# Patient Record
Sex: Female | Born: 1985 | Race: Black or African American | Hispanic: No | Marital: Married | Smoking: Former smoker
Health system: Southern US, Community
[De-identification: ages and names within clinical notes are randomized; demographics above are authoritative.]

## PROBLEM LIST (undated history)

## (undated) ENCOUNTER — Emergency Department (HOSPITAL_COMMUNITY): Admission: EM

## (undated) DIAGNOSIS — E559 Vitamin D deficiency, unspecified: Secondary | ICD-10-CM

## (undated) DIAGNOSIS — R079 Chest pain, unspecified: Secondary | ICD-10-CM

## (undated) DIAGNOSIS — K5900 Constipation, unspecified: Secondary | ICD-10-CM

## (undated) DIAGNOSIS — M549 Dorsalgia, unspecified: Secondary | ICD-10-CM

## (undated) DIAGNOSIS — F17200 Nicotine dependence, unspecified, uncomplicated: Secondary | ICD-10-CM

## (undated) DIAGNOSIS — D649 Anemia, unspecified: Secondary | ICD-10-CM

## (undated) DIAGNOSIS — Z6841 Body Mass Index (BMI) 40.0 and over, adult: Secondary | ICD-10-CM

## (undated) DIAGNOSIS — G4730 Sleep apnea, unspecified: Secondary | ICD-10-CM

## (undated) DIAGNOSIS — K219 Gastro-esophageal reflux disease without esophagitis: Secondary | ICD-10-CM

## (undated) DIAGNOSIS — N979 Female infertility, unspecified: Secondary | ICD-10-CM

## (undated) DIAGNOSIS — M2550 Pain in unspecified joint: Secondary | ICD-10-CM

## (undated) DIAGNOSIS — I499 Cardiac arrhythmia, unspecified: Secondary | ICD-10-CM

## (undated) DIAGNOSIS — R002 Palpitations: Secondary | ICD-10-CM

## (undated) DIAGNOSIS — E282 Polycystic ovarian syndrome: Secondary | ICD-10-CM

## (undated) DIAGNOSIS — D573 Sickle-cell trait: Secondary | ICD-10-CM

## (undated) DIAGNOSIS — I480 Paroxysmal atrial fibrillation: Secondary | ICD-10-CM

## (undated) HISTORY — PX: OTHER SURGICAL HISTORY: SHX169

## (undated) HISTORY — DX: Sleep apnea, unspecified: G47.30

## (undated) HISTORY — DX: Dorsalgia, unspecified: M54.9

## (undated) HISTORY — DX: Body Mass Index (BMI) 40.0 and over, adult: Z684

## (undated) HISTORY — DX: Constipation, unspecified: K59.00

## (undated) HISTORY — DX: Chest pain, unspecified: R07.9

## (undated) HISTORY — DX: Polycystic ovarian syndrome: E28.2

## (undated) HISTORY — DX: Female infertility, unspecified: N97.9

## (undated) HISTORY — DX: Pain in unspecified joint: M25.50

## (undated) HISTORY — PX: WISDOM TOOTH EXTRACTION: SHX21

## (undated) HISTORY — DX: Paroxysmal atrial fibrillation: I48.0

## (undated) HISTORY — DX: Palpitations: R00.2

## (undated) HISTORY — DX: Anemia, unspecified: D64.9

## (undated) HISTORY — DX: Morbid (severe) obesity due to excess calories: E66.01

## (undated) HISTORY — DX: Vitamin D deficiency, unspecified: E55.9

## (undated) HISTORY — DX: Nicotine dependence, unspecified, uncomplicated: F17.200

---

## 2003-03-22 ENCOUNTER — Other Ambulatory Visit: Admission: RE | Admit: 2003-03-22 | Discharge: 2003-03-22 | Payer: Self-pay

## 2004-06-25 ENCOUNTER — Emergency Department (HOSPITAL_COMMUNITY): Admission: EM | Admit: 2004-06-25 | Discharge: 2004-06-25 | Payer: Self-pay | Admitting: Emergency Medicine

## 2004-07-09 ENCOUNTER — Emergency Department (HOSPITAL_COMMUNITY): Admission: EM | Admit: 2004-07-09 | Discharge: 2004-07-09 | Payer: Self-pay | Admitting: Emergency Medicine

## 2004-09-09 ENCOUNTER — Emergency Department (HOSPITAL_COMMUNITY): Admission: EM | Admit: 2004-09-09 | Discharge: 2004-09-09 | Payer: Self-pay | Admitting: Emergency Medicine

## 2006-07-25 ENCOUNTER — Ambulatory Visit: Payer: Self-pay | Admitting: Cardiology

## 2007-10-14 ENCOUNTER — Ambulatory Visit: Payer: Self-pay | Admitting: Cardiology

## 2009-03-23 DIAGNOSIS — R002 Palpitations: Secondary | ICD-10-CM | POA: Insufficient documentation

## 2010-11-27 NOTE — Assessment & Plan Note (Signed)
[facility]                          [facility] NOTE   NAME:Deborah Riddle, Deborah Riddle                      MRN:          [id number]  DATE:10/14/2007                            DOB:          1985-08-16    REFERRING PHYSICIAN:  Van Clines, M.D.   REASON FOR CONSULTATION:  Palpitations.   HISTORY OF PRESENT ILLNESS:  Deborah Riddle is a 25 year old woman with a  reported history of hyperlipidemia being managed by diet and also a  history of bipolar depression followed by [facility] in [place]  on the medications outlined below.  In reviewing available records, I  see that she has a previously diagnosed history of paroxysmal atrial  fibrillation back in January of 2008.  At that time, she was observed in  the hospital and spontaneously converted to sinus rhythm.  She had an  echocardiogram done in January of 2008 which revealed mild left  ventricular hypertrophy with an ejection fraction of 60-65% and no major  valvular abnormalities with a trivial pericardial effusion.  She reports  essentially fairly brief, usually just a few minutes, episodes of  palpitations which occur perhaps once a month.  She had a more prolonged  episode lasting 3-4 hours recently and went to the emergency department.  She was seen by Dr. Gwendolyn Fill and referred originally for a CardioNet  monitor and then follow up with [place].  Deborah Riddle has no health insurance  and did not obtain the cardiac monitor.  She presents today for further  assessment.   She has had no recent prolonged episodes of palpitations.  Her  electrocardiogram today shows sinus rhythm with nonspecific ST-T wave  changes.  I have a copy of an electrocardiogram from September 20, 2007 which  is consistent with atrial fibrillation at a rate of 87 beats per minute  to 108 beats per minute.  She has no clear history of diabetes mellitus,  hypertension, thyroid disease, valvular heart disease or cerebrovascular  disease.   ALLERGIES:  Possibly to HEPARIN (details not clear).   PAST MEDICAL HISTORY:  As outlined above.  I also see previously  documented thrombocytopenia, etiology not certain.  It looks as if her  blood count showed some platelet clumping, and this may have been a lab  error.  She has not had recent blood work.  She did have a TSH within  normal range of 1.9 back in January of last year.   PAST SURGICAL HISTORY:  She denies any surgeries.   SOCIAL HISTORY:  Patient has a tobacco use history.  Denies any alcohol  use or other illicit substances.   FAMILY HISTORY:  Family history was reviewed.  The patient reports a  sister with some type of heart rhythm problem. Nobody with a history  of sudden cardiac death.   REVIEW OF SYSTEMS:  As outlined above.  Otherwise negative.   PHYSICAL EXAMINATION:  VITAL SIGNS:  Blood pressure today is 118/83,  heart rate is 82 and regular.  Weight is 271 pounds.  GENERAL:  This is an obese woman in no acute distress.  HEENT:  Conjunctivae were normal.  Oropharynx was clear.  NECK:  Supple.  No elevated jugular venous pressure.  No loud bruits.  No thyromegaly is noted.  LUNGS:  Clear without labored breathing at rest.  CARDIAC:  A regular rate and rhythm.  No loud murmur or gallop.  ABDOMEN:  Soft, nontender.  Normoactive bowel sounds.  EXTREMITIES:  No significant pitting edema.  Distal pulses are 2+.  SKIN:  Warm and dry.  MUSCULOSKELETAL:  No kyphosis is noted.  NEUROPSYCHIATRIC:  Patient is alert and oriented x3.  Affect is  appropriate.   IMPRESSION AND RECOMMENDATIONS:  History of intermittent palpitations  and previously documented paroxysmal atrial fibrillation.  I suspect her  recent prolonged episode of palpitations was in fact atrial  fibrillation.  Deborah Riddle has no medical insurance and prefers to hold off  on as much testing as possible at this time.  I think it would at least  be worthwhile to repeat a CBC and a TSH to make  sure that she is neither  anemic, thrombocytopenic, or hyperthyroid given her history.  She had a  previous echocardiogram from last year showing no major valvular  abnormalities, and otherwise I will plan to give her a prescription for  p.r.n., propranolol 20 mg for prolonged episodes.  I do not think that  Coumadin is indicated at this time.  We can discuss aspirin, assuming  her history of previously noted thrombocytopenia is not an issue.  Otherwise, we talked about avoiding tobacco use and also significant  caffeine use.  I will plan to see her back over the next 6 months.     Jonelle Sidle, MD  Electronically Signed    SGM/MedQ  DD: 10/14/2007  DT: 10/14/2007  Job #: [id number]   cc:   Van Clines, M.D.

## 2011-08-16 DIAGNOSIS — R0602 Shortness of breath: Secondary | ICD-10-CM

## 2011-08-16 DIAGNOSIS — I4891 Unspecified atrial fibrillation: Secondary | ICD-10-CM

## 2011-08-30 ENCOUNTER — Encounter: Payer: Self-pay | Admitting: Cardiovascular Disease

## 2011-09-10 ENCOUNTER — Other Ambulatory Visit: Payer: Self-pay | Admitting: *Deleted

## 2011-09-10 ENCOUNTER — Encounter: Payer: Self-pay | Admitting: Cardiovascular Disease

## 2011-09-10 ENCOUNTER — Ambulatory Visit (INDEPENDENT_AMBULATORY_CARE_PROVIDER_SITE_OTHER): Payer: PRIVATE HEALTH INSURANCE | Admitting: Cardiovascular Disease

## 2011-09-10 DIAGNOSIS — I4891 Unspecified atrial fibrillation: Secondary | ICD-10-CM

## 2011-09-10 DIAGNOSIS — I480 Paroxysmal atrial fibrillation: Secondary | ICD-10-CM

## 2011-09-10 NOTE — Assessment & Plan Note (Signed)
The patient has paroxysmal atrial fibrillation with recent episode that lasted for about 6 hours. Her echocardiogram was unremarkable. Her thyroid function was normal. She does not have symptoms of sleep apnea. She had 4 episodes of palpitations and tachycardia since her hospital discharge. None lasted for more than one hour but she did take metoprolol. She prefers not to take medications on a regular basis. She is also not in favor of ablation. I will obtain a 14 day outpatient telemetry to evaluate her atrial fibrillation burden. The best management option might be the "pill in the pocket" approach with flecainide or propafenone. This will be decided after her monitor.

## 2011-09-10 NOTE — Progress Notes (Signed)
HPI  This is a 26 year old female who is here today for a followup visit. She was recently briefly hospitalized for atrial fibrillation with rapid ventricular response. She was initially diagnosed with atrial fibrillation in 2008 with an unremarkable echocardiogram. She did not have any prolonged episodes since then up until the current episode that lasted for about 6 hours. She converted to normal sinus rhythm. She had another echocardiogram done which showed normal LV systolic function, mild mitral regurgitation and no evidence of pulmonary hypertension. She is obese but does not have symptoms suggestive of sleep apnea. She does not have hyperthyroidism. She thinks that her mom has atrial fibrillation. Since her discharge, she had few episodes of atrial fibrillation. She used metoprolol 25 mg and went back into sinus rhythm.  Allergies not on file   No current outpatient prescriptions on file prior to visit.     Past Medical History  Diagnosis Date  . Chest pain, unspecified   . Morbid obesity   . Tobacco use disorder   . Body mass index 40.0-44.9, adult   . Encounter for long-term (current) use of other medications   . Paroxysmal atrial fibrillation   . Polycystic disease, ovaries      History reviewed. No pertinent past surgical history.   Family History  Problem Relation Age of Onset  . Arrhythmia Mother 43    History of Cardiac Stents  . Diabetes      Family History  . Hypertension      Family History     History   Social History  . Marital Status: Married    Spouse Name: CHRISTOPHER     Number of Children: N/A  . Years of Education: N/A   Occupational History  . [employer]   Social History Main Topics  . Smoking status: Former Smoker -- 1.0 packs/day    Types: Cigarettes    Quit date: 09/10/2011  . Smokeless tobacco: Not on file  . Alcohol Use: Yes     Drinks alcohol on social occasions   . Drug Use: No  . Sexually Active: Not on file     Other Topics Concern  . Not on file   Social History Narrative  . No narrative on file     PHYSICAL EXAM   BP 130/76  Pulse 74  Resp 18  Ht 5\' 8"  (1.727 m)  Wt 270 lb 6.4 oz (122.653 kg)  BMI 41.11 kg/m2  SpO2 99%  Constitutional: She is oriented to person, place, and time. She appears well-developed and well-nourished. No distress.  HENT: No nasal discharge.  Head: Normocephalic and atraumatic.  Eyes: Pupils are equal and round. Right eye exhibits no discharge. Left eye exhibits no discharge.  Neck: Normal range of motion. Neck supple. No JVD present. No thyromegaly present.  Cardiovascular: Normal rate, regular rhythm, normal heart sounds. Exam reveals no gallop and no friction rub. No murmur heard.  Pulmonary/Chest: Effort normal and breath sounds normal. No stridor. No respiratory distress. She has no wheezes. She has no rales. She exhibits no tenderness.  Abdominal: Soft. Bowel sounds are normal. She exhibits no distension. There is no tenderness. There is no rebound and no guarding.  Musculoskeletal: Normal range of motion. She exhibits no edema and no tenderness.  Neurological: She is alert and oriented to person, place, and time. Coordination normal.  Skin: Skin is warm and dry. No rash noted. She is not diaphoretic. No erythema. No pallor.  Psychiatric: She has a normal mood and  affect. Her behavior is normal. Judgment and thought content normal.    EKG: Normal sinus rhythm with nonspecific T waves in the anterior leads. Normal PR and QT intervals   ASSESSMENT AND PLAN

## 2011-09-10 NOTE — Patient Instructions (Addendum)
Your physician recommends that you schedule a follow-up appointment after we get the results of your monitor. Please call and schedule this appointment the week you receive your monitor.  Your physician has recommended that you wear an event monitor. Event monitors are medical devices that record the heart's electrical activity. Doctors most often [place] these monitors to diagnose arrhythmias. Arrhythmias are problems with the speed or rhythm of the heartbeat. The monitor is a small, portable device. You can wear one while you do your normal daily activities. This is usually used to diagnose what is causing palpitations/syncope (passing out).    Your physician recommends that you continue on your current medications as directed. Please refer to the Current Medication list given to you today.

## 2011-09-17 DIAGNOSIS — I4891 Unspecified atrial fibrillation: Secondary | ICD-10-CM

## 2011-09-21 ENCOUNTER — Inpatient Hospital Stay (HOSPITAL_COMMUNITY)
Admission: AD | Admit: 2011-09-21 | Discharge: 2011-09-22 | DRG: 309 | Disposition: A | Discharge status: Home / Self Care | Payer: PRIVATE HEALTH INSURANCE | Source: Other Acute Inpatient Hospital | Attending: Cardiovascular Disease | Admitting: Cardiovascular Disease

## 2011-09-21 ENCOUNTER — Encounter (HOSPITAL_COMMUNITY): Payer: Self-pay | Admitting: *Deleted

## 2011-09-21 DIAGNOSIS — E119 Type 2 diabetes mellitus without complications: Secondary | ICD-10-CM | POA: Diagnosis present

## 2011-09-21 DIAGNOSIS — I4891 Unspecified atrial fibrillation: Principal | ICD-10-CM | POA: Diagnosis present

## 2011-09-21 DIAGNOSIS — Z6841 Body Mass Index (BMI) 40.0 and over, adult: Secondary | ICD-10-CM

## 2011-09-21 DIAGNOSIS — F17200 Nicotine dependence, unspecified, uncomplicated: Secondary | ICD-10-CM | POA: Diagnosis present

## 2011-09-21 DIAGNOSIS — E282 Polycystic ovarian syndrome: Secondary | ICD-10-CM | POA: Diagnosis present

## 2011-09-22 ENCOUNTER — Other Ambulatory Visit: Payer: Self-pay

## 2011-09-22 DIAGNOSIS — I4891 Unspecified atrial fibrillation: Secondary | ICD-10-CM

## 2011-09-22 LAB — DIFFERENTIAL
Basophils Absolute: 0 10*3/uL (ref 0.0–0.1)
Basophils Relative: 0 % (ref 0–1)
Eosinophils Relative: 2 % (ref 0–5)
Lymphocytes Relative: 43 % (ref 12–46)
Monocytes Absolute: 0.5 10*3/uL (ref 0.1–1.0)
Neutro Abs: 4.9 10*3/uL (ref 1.7–7.7)

## 2011-09-22 LAB — CBC
HCT: 37.2 % (ref 36.0–46.0)
MCHC: 34.7 g/dL (ref 30.0–36.0)
Platelets: 232 10*3/uL (ref 150–400)
RDW: 15.3 % (ref 11.5–15.5)
WBC: 9.9 10*3/uL (ref 4.0–10.5)

## 2011-09-22 LAB — COMPREHENSIVE METABOLIC PANEL
AST: 14 U/L (ref 0–37)
Albumin: 3.1 g/dL — ABNORMAL LOW (ref 3.5–5.2)
BUN: 12 mg/dL (ref 6–23)
Creatinine, Ser: 0.83 mg/dL (ref 0.50–1.10)
Potassium: 3.7 mEq/L (ref 3.5–5.1)
Total Protein: 6.6 g/dL (ref 6.0–8.3)

## 2011-09-22 LAB — APTT: aPTT: 30 seconds (ref 24–37)

## 2011-09-22 LAB — MAGNESIUM: Magnesium: 2 mg/dL (ref 1.5–2.5)

## 2011-09-22 LAB — PROTIME-INR: Prothrombin Time: 12.6 seconds (ref 11.6–15.2)

## 2011-09-22 LAB — GLUCOSE, CAPILLARY: Glucose-Capillary: 102 mg/dL — ABNORMAL HIGH (ref 70–99)

## 2011-09-22 LAB — TSH: TSH: 3.49 u[IU]/mL (ref 0.350–4.500)

## 2011-09-22 MED ORDER — SODIUM CHLORIDE 0.9 % IJ SOLN
3.0000 mL | Freq: Two times a day (BID) | INTRAMUSCULAR | Status: DC
  Administered 2011-09-22: 3 mL via INTRAVENOUS

## 2011-09-22 MED ORDER — FLECAINIDE ACETATE 100 MG PO TABS
300.0000 mg | ORAL_TABLET | ORAL | Status: AC
  Administered 2011-09-22: 300 mg via ORAL
  Filled 2011-09-22: qty 3

## 2011-09-22 MED ORDER — ONDANSETRON HCL 4 MG/2ML IJ SOLN: 4.0000 mg | Freq: Four times a day (QID) | INTRAMUSCULAR | Status: DC | PRN

## 2011-09-22 MED ORDER — ASPIRIN 300 MG RE SUPP
300.0000 mg | RECTAL | Status: AC
  Filled 2011-09-22: qty 1

## 2011-09-22 MED ORDER — FLECAINIDE ACETATE 150 MG PO TABS: ORAL_TABLET | ORAL | Status: DC

## 2011-09-22 MED ORDER — OFF THE BEAT BOOK
Freq: Once | Status: AC
  Administered 2011-09-22: 05:00:00
  Filled 2011-09-22: qty 1

## 2011-09-22 MED ORDER — ASPIRIN EC 325 MG PO TBEC: 325.0000 mg | DELAYED_RELEASE_TABLET | Freq: Every day | ORAL | Status: DC

## 2011-09-22 MED ORDER — SODIUM CHLORIDE 0.9 % IJ SOLN: 3.0000 mL | INTRAMUSCULAR | Status: DC | PRN

## 2011-09-22 MED ORDER — SODIUM CHLORIDE 0.9 % IV SOLN: 250.0000 mL | INTRAVENOUS | Status: DC | PRN

## 2011-09-22 MED ORDER — METFORMIN HCL 500 MG PO TABS
500.0000 mg | ORAL_TABLET | Freq: Two times a day (BID) | ORAL | Status: DC
  Administered 2011-09-22: 500 mg via ORAL
  Filled 2011-09-22 (×3): qty 1

## 2011-09-22 MED ORDER — ASPIRIN 81 MG PO CHEW
324.0000 mg | CHEWABLE_TABLET | ORAL | Status: AC
  Administered 2011-09-22: 324 mg via ORAL
  Filled 2011-09-22: qty 4

## 2011-09-22 MED ORDER — ACETAMINOPHEN 325 MG PO TABS: 650.0000 mg | ORAL_TABLET | ORAL | Status: DC | PRN

## 2011-09-22 MED ORDER — NITROGLYCERIN 0.4 MG SL SUBL: 0.4000 mg | SUBLINGUAL_TABLET | SUBLINGUAL | Status: DC | PRN

## 2011-09-22 MED ORDER — ASPIRIN 325 MG PO TBEC: 325.0000 mg | DELAYED_RELEASE_TABLET | Freq: Every day | ORAL | Status: AC

## 2011-09-22 NOTE — Discharge Summary (Signed)
Physician Discharge Summary  Patient ID: Deborah Riddle MRN: [id number] DOB/AGE: 11-Mar-1986 26 y.o.  Admit date: 09/21/2011 Discharge date: 09/22/2011  Primary Discharge Diagnosis: PAF Secondary Discharge Diagnosis 1. Diabetes 2. Morbid Obesity  Significant Diagnostic Studies:None  Consults: None  Hospital Course: Deborah Riddle is a 26 year old morbidly obese female patient of Dr. Virgil Benedict in the [place] office. The patient has a history of paroxysmal atrial fibrillation, which was first diagnosed in 2008. She was seen by Dr. are read in the office on 09/10/2011 secondary to paroxysmal atrial fibrillation. He discussed the need of to be placed on a cardiac monitor and also consideration for flecainide. "Pill in the pocket.". The patient had a monitor placed on 09/12/2011 however, results are not available to discuss.  The patient woke up suddenly on 09/21/2011 with palpitations and racing heart. She felt her heart racing throughout the day. She was was seen at [facility] was found to be in A. fib with RVR and transferred to the hospital. She was seen by the cardiology fellow and given 300 mg of flecainide by mouth placed on telemetry for observation. She converted to normal sinus rhythm around 6 AM on 09/22/2011. She was without complaint of recurrent palpitations, chest pressure, dizziness, or lightheadedness. She was seen and examined by myself and Dr. Maretta Bees on day of discharge. She will continue metoprolol 25 mg daily and will be given a prescription for flecainide 300 mg to take when necessary for rapid heart rhythm. She was counseled on low caffeine diet and to avoid stimulants. She will also be seen in the [facility] office by Dr. Hillis Range, electrophysiologist for initial evaluation. He will discuss further need for ablation or continued medical management. Once his evaluation is completed. She will continue with cardiologist in [place].  Discharge Exam: Blood pressure 114/80,  pulse 81, temperature 97.8 F (36.6 C), temperature source Oral, resp. rate 18, height 5\' 8"  (1.727 m), weight 283 lb 4.7 oz (128.5 kg), last menstrual period 09/11/2011, SpO2 98.00%. General: Well developed, well nourished, in no acute distress obese Head: Eyes PERRLA, No xanthomas.   Normal cephalic and atramatic  Lungs: Clear bilaterally to auscultation and percussion. Heart: HRRR S1 S2, without MRG.  Pulses are 2+ & equal.            No carotid bruit. No JVD.  No abdominal bruits. No femoral bruits. Abdomen: Bowel sounds are positive, abdomen soft and non-tender without masses or                  Hernia's noted. Msk:  Back normal, normal gait. Normal strength and tone for age. Extremities: No clubbing, cyanosis or edema.  DP +1 Neuro: Alert and oriented X 3. Psych:  Good affect, responds appropriately   Labs:   Lab Results  Component Value Date   WBC 9.9 09/22/2011   HGB 12.9 09/22/2011   HCT 37.2 09/22/2011   MCV 73.1* 09/22/2011   PLT 232 09/22/2011     EKG:NSR rate in the 80's.  FOLLOW UP PLANS AND APPOINTMENTS  Will need to see Dr. Johney Frame, Electrocardiologist in [place] office. Office will call for appointment.  Discharge Orders    Future Orders Please Complete By Expires   Diet - low sodium heart healthy      Increase activity slowly        Medication List  As of 09/22/2011 11:00 AM   TAKE these medications         aspirin 325 MG EC  tablet   Take 1 tablet (325 mg total) by mouth daily.      flecainide 150 MG tablet   Commonly known as: TAMBOCOR   2 tablets or 300 mg as needed for rapid heart rate.      metFORMIN 500 MG tablet   Commonly known as: GLUCOPHAGE   Take 500 mg by mouth 2 (two) times daily with a meal.      metoprolol tartrate 25 MG tablet   Commonly known as: LOPRESSOR   Take 25 mg by mouth daily as needed. For racing heart           Time spent with patient to include physician time:35 min Signed: Joni Reining 09/22/2011, 11:00 AM Co-Sign  MD

## 2011-09-22 NOTE — Progress Notes (Signed)
09/22/11 0504  Patient spontaneously converted back to normal sinus rhythm.  Strip posted on chart.  Alonza Bogus

## 2011-09-22 NOTE — Discharge Instructions (Signed)
Atrial Fibrillation Your caregiver has diagnosed you with atrial fibrillation (AFib). The heart normally beats very regularly; AFib is a type of irregular heartbeat. The heart rate may be faster or slower than normal. This can prevent your heart from pumping as well as it should. AFib can be constant (chronic) or intermittent (paroxysmal). CAUSES  Atrial fibrillation may be caused by:  Heart disease, including heart attack, coronary artery disease, heart failure, diseases of the heart valves, and others.   Blood clot in the lungs (pulmonary embolism).   Pneumonia or other infections.   Chronic lung disease.   Thyroid disease.   Toxins. These include alcohol, some medications (such as decongestant medications or diet pills), and caffeine.  In some people, no cause for AFib can be found. This is referred to as Lone Atrial Fibrillation. SYMPTOMS   Palpitations or a fluttering in your chest.   A vague sense of chest discomfort.   Shortness of breath.   Sudden onset of lightheadedness or weakness.  Sometimes, the first sign of AFib can be a complication of the condition. This could be a stroke or heart failure. DIAGNOSIS  Your description of your condition may make your caregiver suspicious of atrial fibrillation. Your caregiver will examine your pulse to determine if fibrillation is present. An EKG (electrocardiogram) will confirm the diagnosis. Further testing may help determine what caused you to have atrial fibrillation. This may include chest x-ray, echocardiogram, blood tests, or CT scans. PREVENTION  If you have previously had atrial fibrillation, your caregiver may advise you to avoid substances known to cause the condition (such as stimulant medications, and possibly caffeine or alcohol). You may be advised to use medications to prevent recurrence. Proper treatment of any underlying condition is important to help prevent recurrence. PROGNOSIS  Atrial fibrillation does tend to  become a chronic condition over time. It can cause significant complications (see below). Atrial fibrillation is not usually immediately life-threatening, but it can shorten your life expectancy. This seems to be worse in women. If you have lone atrial fibrillation and are under 60 years old, the risk of complications is very low, and life expectancy is not shortened. RISKS AND COMPLICATIONS  Complications of atrial fibrillation can include stroke, chest pain, and heart failure. Your caregiver will recommend treatments for the atrial fibrillation, as well as for any underlying conditions, to help minimize risk of complications. TREATMENT  Treatment for AFib is divided into several categories:  Treatment of any underlying condition.   Converting you out of AFib into a regular (sinus) rhythm.   Controlling rapid heart rate.   Prevention of blood clots and stroke.  Medications and procedures are available to convert your atrial fibrillation to sinus rhythm. However, recent studies have shown that this may not offer you any advantage, and cardiac experts are continuing research and debate on this topic. More important is controlling your rapid heartbeat. The rapid heartbeat causes more symptoms, and places strain on your heart. Your caregiver will advise you on the use of medications that can control your heart rate. Atrial fibrillation is a strong stroke risk. You can lessen this risk by taking blood thinning medications such as Coumadin (warfarin), or sometimes aspirin. These medications need close monitoring by your caregiver. Over-medication can cause bleeding. Too little medication may not protect against stroke. HOME CARE INSTRUCTIONS   If your caregiver prescribed medicine to make your heartbeat more normally, take as directed.   If blood thinners were prescribed by your caregiver, take EXACTLY as directed.     Perform blood tests EXACTLY as directed.   Quit smoking. Smoking increases your  cardiac and lung (pulmonary) risks.   DO NOT drink alcohol.   DO NOT drink caffeinated drinks (e.g. coffee, soda, chocolate, and leaf teas). You may drink decaffeinated coffee, soda or tea.   If you are overweight, you should choose a reduced calorie diet to lose weight. Please see a registered dietitian if you need more information about healthy weight loss. DO NOT USE DIET PILLS as they may aggravate heart problems.   If you have other heart problems that are causing AFib, you may need to eat a low salt, fat, and cholesterol diet. Your caregiver will tell you if this is necessary.   Exercise every day to improve your physical fitness. Stay active unless advised otherwise.   If your caregiver has given you a follow-up appointment, it is very important to keep that appointment. Not keeping the appointment could result in heart failure or stroke. If there is any problem keeping the appointment, you must call back to this facility for assistance.  SEEK MEDICAL CARE IF:  You notice a change in the rate, rhythm or strength of your heartbeat.   You develop an infection or any other change in your overall health status.  SEEK IMMEDIATE MEDICAL CARE IF:   You develop chest pain, abdominal pain, sweating, weakness or feel sick to your stomach (nausea).   You develop shortness of breath.   You develop swollen feet and ankles.   You develop dizziness, numbness, or weakness of your face or limbs, or any change in vision or speech.  MAKE SURE YOU:   Understand these instructions.   Will watch your condition.   Will get help right away if you are not doing well or get worse.  Document Released: 07/01/2005 Document Revised: 06/20/2011 Document Reviewed: 02/03/2008 Exit[employer] Patient Information 2012 [employer]. 

## 2011-09-22 NOTE — Progress Notes (Addendum)
SUBJECTIVE: Feels better. No complaints of shortness of breath or palpitations. Converted to NSR at 6 am this morning.  Principal Problem:  *Atrial fibrillation   LABS: Basic Metabolic Panel:  Basename 09/22/11 0143  NA 140  K 3.7  CL 106  CO2 26  GLUCOSE 115*  BUN 12  CREATININE 0.83  CALCIUM 8.9  MG 2.0  PHOS --   Liver Function Tests:  Basename 09/22/11 0143  AST 14  ALT 11  ALKPHOS 79  BILITOT 0.2*  PROT 6.6  ALBUMIN 3.1*   No results found for this basename: LIPASE:2,AMYLASE:2 in the last 72 hours CBC:  Basename 09/22/11 0143  WBC 9.9  NEUTROABS 4.9  HGB 12.9  HCT 37.2  MCV 73.1*  PLT 232     PHYSICAL EXAM BP 114/80  Pulse 81  Temp(Src) 97.8 F (36.6 C) (Oral)  Resp 18  Ht 5\' 8"  (1.727 m)  Wt 283 lb 4.7 oz (128.5 kg)  BMI 43.07 kg/m2  SpO2 98%  LMP 09/11/2011 General: Well developed, well nourished, in no acute distress Head: Eyes PERRLA, No xanthomas.   Normal cephalic and atramatic  Lungs: Clear bilaterally to auscultation and percussion. Heart: HRRR S1 S2, No MRG .  Pulses are 2+ & equal.            No carotid bruit. No JVD.  No abdominal bruits. No femoral bruits. Abdomen: Bowel sounds are positive, abdomen soft and non-tender without masses or                  Hernia's noted. Msk:  Back normal, normal gait. Normal strength and tone for age. Extremities: No clubbing, cyanosis or edema.  DP +1 Neuro: Alert and oriented X 3. Psych:  Good affect, responds appropriately  TELEMETRY: Reviewed telemetry pt in: NSR  ASSESSMENT AND PLAN:  1. PAF: She has converted to NSR after one dose of flecinide 300mg .  She is feeling better and without symptoms. She will need follow-up with EP, Dr. Johney Frame in [place] after discharge.  She will be given flecinide 300mg  tablet "pill in the pocket" on discharge. Review of Dr. Jari Sportsman note states that she has normal TSH. Normal Echo.  Bettey Mare. Lyman Bishop NP [facility] 09/22/2011, 10:45 AM  Cardiology  Attending  Patient seen and examined. I have reviewed Ms. Lawrence's assessment and plan. Will discharge this a.m. With a pill in the pocket of flecainide. She will need followup in the next few months with Dr. Johney Frame. I have instructed her on avoiding caffeine, ETOH, and to get plenty of rest. Lewayne Bunting, M.D.

## 2011-09-22 NOTE — H&P (Signed)
Deborah Riddle is an 26 y.o. female with a past medical history significant for atrial fibrillation Chief Complaint: palpitations HPI: Patient was first diagnosed with AF back in 2008. Since then she has had very brief episodes of palpitations lasting about 5 mins. Last month however, she had an episode that lasted about 6 hrs requiring admission but self converted to sinus rhythm. Following discharge she was seen in clinic by Dr. Lorine Bears who prescribed an event monitor in order to help decide if to start her on a "pill in pocket" regimen.  She went to bed at about 1 am yesterday 09/21/2011 and woke up at 6 am with palpitations. She remained in AF all day and because this is highly unusual for her, decided to go to [facility] ER from where she was transferred here. She was still in AF upon admission here. Her symptoms include palpitations, lightheadedness and fatigue. She denied syncope, orthopnea, leg edema or SOB.   Past Medical History  Diagnosis Date  . Chest pain, unspecified   . Morbid obesity   . Tobacco use disorder   . Body mass index 40.0-44.9, adult   . Encounter for long-term (current) use of other medications   . Paroxysmal atrial fibrillation   . Polycystic disease, ovaries     No past surgical history on file.  Family History  Problem Relation Age of Onset  . Arrhythmia Mother 61    History of Cardiac Stents  . Diabetes      Family History  . Hypertension      Family History   Social History:  reports that she quit smoking 12 days ago. Her smoking use included Cigarettes. She smoked 1 pack per day. She does not have any smokeless tobacco history on file. She reports that she drinks alcohol. She reports that she does not use illicit drugs.  Allergies:  Allergies  Allergen Reactions  . Heparin Other (See Comments)    Lowers blood pressure     Medications Prior to Admission  Medication Dose Route Frequency Provider Last Rate Last Dose  . 0.9 %  sodium  chloride infusion  250 mL Intravenous PRN Marinus Maw, MD      . acetaminophen (TYLENOL) tablet 650 mg  650 mg Oral Q4H PRN Marinus Maw, MD      . aspirin chewable tablet 324 mg  324 mg Oral NOW Marinus Maw, MD       Or  . aspirin suppository 300 mg  300 mg Rectal NOW Marinus Maw, MD      . aspirin EC tablet 325 mg  325 mg Oral Daily Marinus Maw, MD      . flecainide [facility]) tablet 300 mg  300 mg Oral STAT Marinus Maw, MD      . metFORMIN (GLUCOPHAGE) tablet 500 mg  500 mg Oral BID WC Marinus Maw, MD      . nitroGLYCERIN (NITROSTAT) SL tablet 0.4 mg  0.4 mg Sublingual Q5 Min x 3 PRN Marinus Maw, MD      . ondansetron [facility]) injection 4 mg  4 mg Intravenous Q6H PRN Marinus Maw, MD      . sodium chloride 0.9 % injection 3 mL  3 mL Intravenous Q12H Marinus Maw, MD      . sodium chloride 0.9 % injection 3 mL  3 mL Intravenous PRN Marinus Maw, MD       Medications Prior to Admission  Medication Sig  Dispense Refill  . metFORMIN (GLUCOPHAGE) 500 MG tablet Take 500 mg by mouth 2 (two) times daily with a meal.      . metoprolol tartrate (LOPRESSOR) 25 MG tablet Take 25 mg by mouth daily as needed. For racing heart        Results for orders placed during the hospital encounter of 09/21/11 (from the past 48 hour(s))  GLUCOSE, CAPILLARY     Status: Normal   Collection Time   09/21/11  9:47 PM      Component Value Range Comment   Glucose-Capillary 82  70 - 99 (mg/dL)    No results found.  Review of Systems  Constitutional: Negative.   HENT: Negative.   Eyes: Negative.   Respiratory: Negative.   Gastrointestinal: Negative.   Genitourinary: Negative.   Musculoskeletal: Negative.   Skin: Negative.   Neurological: Negative.   Endo/Heme/Allergies: Negative.   Psychiatric/Behavioral: Negative.     Blood pressure 100/68, pulse 72, temperature 98.3 F (36.8 C), resp. rate 18, height 5\' 8"  (1.727 m), weight 283 lb 4.7 oz (128.5 kg), last menstrual period  09/11/2011, SpO2 98.00%. Physical Exam  Constitutional: No distress.  HENT:  Mouth/Throat: Oropharynx is clear and moist.  Eyes: Conjunctivae are normal. No scleral icterus.  Neck: No JVD present.  Cardiovascular: Normal rate, normal heart sounds and intact distal pulses.  An irregularly irregular rhythm present. PMI is not displaced.   No murmur heard. Skin: She is not diaphoretic.    EKG AF with a controlled ventricular response  2D ECHO (Feb 2013) EF 60-65%, Left atrial size of 3.9 cm in diameter  Assessment/Plan 1) Paroxysmal atrial fibrillation 2 DM  Patient with paroxysmal atrial fibrillation and an otherwise unremarkable medical history. Episodes of AF used to be extremely brief lasting less than 5 mins, however she has had 2 episodes now in as many months that landed her in the hospital. It seems she now requires rhythm control with at least pill in pocket approach.  Will give her flecainide 300mg  stat tonight. If she doesn't revert to sinus rhythm in the morning, will cardiovert and discharge home probably on flecainide. CHADS score is low (1 for DM) so will give ASA 325mg  for now. Will continue her metformin for DM Consider sleep study as an out patient.   Grandville Silos 09/22/2011, 12:59 AM

## 2011-09-23 ENCOUNTER — Encounter: Payer: Self-pay | Admitting: *Deleted

## 2011-09-23 ENCOUNTER — Telehealth: Payer: Self-pay | Admitting: *Deleted

## 2011-09-23 ENCOUNTER — Encounter: Payer: Self-pay | Admitting: Cardiovascular Disease

## 2011-09-23 ENCOUNTER — Ambulatory Visit (INDEPENDENT_AMBULATORY_CARE_PROVIDER_SITE_OTHER): Payer: PRIVATE HEALTH INSURANCE | Admitting: Cardiovascular Disease

## 2011-09-23 ENCOUNTER — Other Ambulatory Visit: Payer: Self-pay | Admitting: Cardiovascular Disease

## 2011-09-23 DIAGNOSIS — Z79899 Other long term (current) drug therapy: Secondary | ICD-10-CM

## 2011-09-23 DIAGNOSIS — I4891 Unspecified atrial fibrillation: Secondary | ICD-10-CM

## 2011-09-23 DIAGNOSIS — I480 Paroxysmal atrial fibrillation: Secondary | ICD-10-CM

## 2011-09-23 DIAGNOSIS — R079 Chest pain, unspecified: Secondary | ICD-10-CM

## 2011-09-23 MED ORDER — FLECAINIDE ACETATE 150 MG PO TABS
150.0000 mg | ORAL_TABLET | ORAL | Status: DC | PRN
Start: 2011-09-23 — End: 2012-09-22

## 2011-09-23 MED ORDER — FLECAINIDE ACETATE 50 MG PO TABS: 50.0000 mg | ORAL_TABLET | Freq: Two times a day (BID) | ORAL | Status: DC

## 2011-09-23 NOTE — Progress Notes (Signed)
HPI  This is a 26 year old female who is here today for followup visit. She was hospitalized at [facility] for one day 2 days ago due to atrial fibrillation. She was given flecainide 300 mg x1 and converted to normal sinus rhythm. Since I saw her recently, she actually has been having frequent episodes of palpitations and likely atrial fibrillation. She also had some substernal chest discomfort and has been feeling tired and fatigued.  Allergies  Allergen Reactions  . Heparin Other (See Comments)    Lowers blood pressure      Current Outpatient Prescriptions on File Prior to Visit  Medication Sig Dispense Refill  . aspirin EC 325 MG EC tablet Take 1 tablet (325 mg total) by mouth daily.  30 tablet  10  . metFORMIN (GLUCOPHAGE) 500 MG tablet Take 500 mg by mouth 2 (two) times daily with a meal.      . metoprolol tartrate (LOPRESSOR) 25 MG tablet Take 25 mg by mouth daily as needed. For racing heart         Past Medical History  Diagnosis Date  . Chest pain, unspecified   . Morbid obesity   . Tobacco use disorder   . Body mass index 40.0-44.9, adult   . Encounter for long-term (current) use of other medications   . Paroxysmal atrial fibrillation   . Polycystic disease, ovaries      History reviewed. No pertinent past surgical history.   Family History  Problem Relation Age of Onset  . Arrhythmia Mother 19    History of Cardiac Stents  . Diabetes      Family History  . Hypertension      Family History     History   Social History  . Marital Status: Married    Spouse Name: CHRISTOPHER     Number of Children: N/A  . Years of Education: N/A   Occupational History  . [employer]   Social History Main Topics  . Smoking status: Former Smoker -- 1.0 packs/day for 5 years    Types: Cigarettes    Quit date: 09/10/2011  . Smokeless tobacco: Never Used  . Alcohol Use: Yes     Drinks alcohol on social occasions   . Drug Use: No  .  Sexually Active: Yes   Other Topics Concern  . Not on file   Social History Narrative  . No narrative on file     PHYSICAL EXAM   BP 116/71  Pulse 65  Ht 5\' 8"  (1.727 m)  Wt 274 lb (124.286 kg)  BMI 41.66 kg/m2  LMP 09/11/2011  Constitutional: She is oriented to person, place, and time. She appears well-developed and well-nourished. No distress.  HENT: No nasal discharge.  Head: Normocephalic and atraumatic.  Eyes: Pupils are equal and round. Right eye exhibits no discharge. Left eye exhibits no discharge.  Neck: Normal range of motion. Neck supple. No JVD present. No thyromegaly present.  Cardiovascular: Normal rate, regular rhythm, normal heart sounds. Exam reveals no gallop and no friction rub. No murmur heard.  Pulmonary/Chest: Effort normal and breath sounds normal. No stridor. No respiratory distress. She has no wheezes. She has no rales. She exhibits no tenderness.  Abdominal: Soft. Bowel sounds are normal. She exhibits no distension. There is no tenderness. There is no rebound and no guarding.  Musculoskeletal: Normal range of motion. She exhibits no edema and no tenderness.  Neurological: She is alert and oriented to person, place, and time. Coordination  normal.  Skin: Skin is warm and dry. No rash noted. She is not diaphoretic. No erythema. No pallor.  Psychiatric: She has a normal mood and affect. Her behavior is normal. Judgment and thought content normal.    EKG: Normal sinus rhythm with sinus arrhythmia. No significant ST or T wave changes.   ASSESSMENT AND PLAN

## 2011-09-23 NOTE — Telephone Encounter (Signed)
Pt left message on voicemail asking for a return call. She states in message that she is concerned about some chest pain. She would like to know if she should be seen or if she should try the new RX she was given to see if that helps first. She would also states she was seen at [facility] this weekend.   Spoke with pt who states she has chest pain that is coming/going. She states it is around the bottom neck to bottom of (L) side of chest. Fatigued. It's not sharp pain. She would like chest pain evaluated. Pt will see Dr. Kirke Corin today at 1:15.

## 2011-09-23 NOTE — Assessment & Plan Note (Signed)
The patient's episode of atrial fibrillation are becoming more frequent. She had 2 recent hospitalizations for this. Her echocardiogram was overall unremarkable. Her labs including thyroid panel was also nonrevealing. I think with her obesity, it is important to evaluate for possible underlying sleep apnea. A sleep study will be requested. Her symptoms are becoming frequent that an antiarrhythmic medication or an ablation procedure is needed. Initially she was hesitant to the idea of catheter ablation. However, now with her symptoms are more frequent she is open to discuss this further. I will send her to see Dr. Johney Frame for consultation. In the meantime, I recommend starting flecainide 50 mg twice daily. She can continue to use 150 mg dose when necessary for breakthrough. I will obtain a treadmill stress test in one week to rule out a proarrhythmic effect. She also has been having chest pain and fatigue.

## 2011-09-23 NOTE — Patient Instructions (Addendum)
   Keep scheduled appointment with Dr. Johney Frame.  Start Flecainide 50 mg two times a day. You may take 1 of the 150 mg tablets in addition to your daily twice a day dosing if needed for breakthrough atrial fib.  Your physician has requested that you have an exercise tolerance test. For further information please visit [email or web address] Please also follow instruction sheet, as given. Referral to Dr. Andrey Campanile for evaluation of sleep study.

## 2011-09-26 ENCOUNTER — Telehealth: Payer: Self-pay | Admitting: *Deleted

## 2011-09-26 NOTE — Telephone Encounter (Signed)
Pt states since starting flecainide she has been wheezing. She states she has been on it for 2 days. She states her O2 sat is 98% but she just feels like it is harder to breathe and is wheezing.

## 2011-09-27 NOTE — Telephone Encounter (Signed)
Pt notified and verbalized understanding.

## 2011-09-27 NOTE — Telephone Encounter (Signed)
I am not aware that Flecainide causes wheezing or difficulty breathing but if she feels that it is, then she can stop it and see if things get better. She can continue to use the 150 mg dose as needed if she goes into A-fib.

## 2011-09-30 ENCOUNTER — Encounter: Payer: Self-pay | Admitting: Internal Medicine

## 2011-09-30 ENCOUNTER — Ambulatory Visit (INDEPENDENT_AMBULATORY_CARE_PROVIDER_SITE_OTHER): Payer: PRIVATE HEALTH INSURANCE | Admitting: Internal Medicine

## 2011-09-30 VITALS — BP 119/79 | HR 77 | Resp 18 | Ht 68.0 in | Wt 271.1 lb

## 2011-09-30 DIAGNOSIS — I480 Paroxysmal atrial fibrillation: Secondary | ICD-10-CM

## 2011-09-30 DIAGNOSIS — I4891 Unspecified atrial fibrillation: Secondary | ICD-10-CM

## 2011-09-30 MED ORDER — VERAPAMIL HCL ER 180 MG PO TBCR: 180.0000 mg | EXTENDED_RELEASE_TABLET | Freq: Every day | ORAL | Status: DC

## 2011-09-30 NOTE — Patient Instructions (Signed)
Your physician recommends that you schedule a follow-up appointment in: 6 weeks with Dr Johney Frame   Your physician has recommended you make the following change in your medication:  1) STOP Metoprolol 2) Start Verapamil 180mg  daily   Work on weight loss  Get sleep study as ordered

## 2011-09-30 NOTE — Assessment & Plan Note (Signed)
The patient has symptomatic recurrent afib.  She has had SOB and wheezing which she attributed to flecainide, though metoprolol would have been more likely to have caused this. Therapeutic strategies for afib including medicine and ablation were discussed in detail with the patient today. Risk, benefits, and alternatives to EP study and radiofrequency ablation for afib were also discussed in detail today.  At this time, she would like to continue medical therapy.  I will therefore stop metoprolol and start verapamil 180mg  daily.  She should retry flecainide 50mg  BID once stable on verapamil. Continue ASA for CHADS2 score of 0 (she denies h/o diabetes and takes metfomin for PCOS).  The importance of weight reduction was stressed today. She has a sleep study planned. She is wearing an event monitor which I will review upon return in 6 weeks.

## 2011-09-30 NOTE — Progress Notes (Signed)
Primary Care Physician: Donzetta Sprung, MD, MD Referring Physician:  Dr Gilford Rile is a 26 y.o. female with a h/o obesity and paroxysmal atrial fibrillation who presents today for EP consultation.  She reports initially being diagnosed with atrial fibrillation in 2008 after presenting to [facility] with symptoms of palpitations and anxiety.  She was hospitalized at [facility] and spontaneously converted back to sinus within 3 days.  She did well without further afib for several years.  Recently, she reports increasing frequency and duration of palpitations.  She presented 09/22/11 to [facility] with symptoms of chest pain and palpitations.  She was again found to have afib.  She was transferred to [facility] where she received flecainide 300mg  with return to sinus rhythm and resolution of symptoms.  Since that time, she has had several short episodes of afib.  She has been placed on flecainide 50mg  BID.  She developed wheezing which she felt might be due to flecainide and therefore stopped the medicine.  She presently plans to take flecainide 150mg  prn for afib but has not had to do so.  She is unaware of triggers or precipitants for her afib. Today, she denies symptoms of palpitations, chest pain, shortness of breath, orthopnea, PND, lower extremity edema, dizziness, presyncope, syncope, or neurologic sequela. The patient is tolerating medications without difficulties and is otherwise without complaint today.   Past Medical History  Diagnosis Date  . Chest pain, unspecified   . Morbid obesity   . Tobacco use disorder   . Body mass index 40.0-44.9, adult   . Paroxysmal atrial fibrillation   . Polycystic disease, ovaries    No past surgical history on file.  Current Outpatient Prescriptions  Medication Sig Dispense Refill  . aspirin EC 325 MG EC tablet Take 1 tablet (325 mg total) by mouth daily.  30 tablet  10  . flecainide (TAMBOCOR) 150 MG tablet Take 1 tablet (150 mg total)  by mouth as needed (May take 1 tablet in addition to daily flecainide as needed for breakthrough A.Fib).  30 tablet  1  . metFORMIN (GLUCOPHAGE) 500 MG tablet Take 500 mg by mouth 2 (two) times daily with a meal.      . metoprolol tartrate (LOPRESSOR) 25 MG tablet Take 25 mg by mouth daily as needed. For racing heart      . flecainide (TAMBOCOR) 50 MG tablet Take 1 tablet (50 mg total) by mouth 2 (two) times daily.  60 tablet  3    Allergies  Allergen Reactions  . Heparin Other (See Comments)    Lowers blood pressure     History   Social History  . Marital Status: Married    Spouse Name: CHRISTOPHER     Number of Children: N/A  . Years of Education: N/A   Occupational History  . [employer]   Social History Main Topics  . Smoking status: Former Smoker -- 1.0 packs/day for 5 years    Types: Cigarettes    Quit date: 09/10/2011  . Smokeless tobacco: Never Used  . Alcohol Use: Yes     Drinks alcohol on social occasions   . Drug Use: No  . Sexually Active: Yes   Other Topics Concern  . Not on file   Social History Narrative   Lives in [place] with husband.  Works as a Water quality scientist at [employer].    Family History  Problem Relation Age of Onset  . Arrhythmia Mother 71    History  of Cardiac Stents  . Diabetes      Family History  . Hypertension      Family History  she is unaware of any family history of afib.  ROS- All systems are reviewed and negative except as per the HPI above  Physical Exam: Filed Vitals:   09/30/11 0904  BP: 119/79  Pulse: 77  Resp: 18  Height: 5\' 8"  (1.727 m)  Weight: 271 lb 1.9 oz (122.979 kg)    GEN- The patient is obese appearing, alert and oriented x 3 today.   Head- normocephalic, atraumatic Eyes-  Sclera clear, conjunctiva pink Ears- hearing intact Oropharynx- clear Neck- supple, no JVP Lymph- no cervical lymphadenopathy Lungs- Clear to ausculation bilaterally, normal work of breathing Heart-  Regular rate and rhythm, no murmurs, rubs or gallops, PMI not laterally displaced GI- soft, NT, ND, + BS Extremities- no clubbing, cyanosis, or edema MS- no significant deformity or atrophy Skin- no rash or lesion Psych- euthymic mood, full affect Neuro- strength and sensation are intact  EKG 09/23/11- sinus 62 bpm, PR 162, QRS 84, Qtc 418, otherwise normal ekg Ekg 09/22/11- afib Echo reviewed  Assessment and Plan:

## 2011-10-01 DIAGNOSIS — R072 Precordial pain: Secondary | ICD-10-CM

## 2011-10-04 ENCOUNTER — Encounter: Payer: Self-pay | Admitting: *Deleted

## 2011-11-13 ENCOUNTER — Ambulatory Visit: Payer: PRIVATE HEALTH INSURANCE | Admitting: Internal Medicine

## 2011-12-19 ENCOUNTER — Ambulatory Visit: Payer: PRIVATE HEALTH INSURANCE | Admitting: Internal Medicine

## 2012-01-08 ENCOUNTER — Encounter: Payer: Self-pay | Admitting: Internal Medicine

## 2013-06-13 ENCOUNTER — Emergency Department (HOSPITAL_COMMUNITY)
Admission: EM | Admit: 2013-06-13 | Discharge: 2013-06-13 | Disposition: A | Discharge status: Home / Self Care | Payer: Medicaid Other | Attending: Emergency Medicine | Admitting: Emergency Medicine

## 2013-06-13 ENCOUNTER — Encounter (HOSPITAL_COMMUNITY): Payer: Self-pay | Admitting: Emergency Medicine

## 2013-06-13 DIAGNOSIS — Z79899 Other long term (current) drug therapy: Secondary | ICD-10-CM | POA: Insufficient documentation

## 2013-06-13 DIAGNOSIS — Z87891 Personal history of nicotine dependence: Secondary | ICD-10-CM | POA: Insufficient documentation

## 2013-06-13 DIAGNOSIS — S61409A Unspecified open wound of unspecified hand, initial encounter: Secondary | ICD-10-CM | POA: Insufficient documentation

## 2013-06-13 DIAGNOSIS — S61411A Laceration without foreign body of right hand, initial encounter: Secondary | ICD-10-CM

## 2013-06-13 DIAGNOSIS — Z6841 Body Mass Index (BMI) 40.0 and over, adult: Secondary | ICD-10-CM | POA: Insufficient documentation

## 2013-06-13 DIAGNOSIS — Z23 Encounter for immunization: Secondary | ICD-10-CM | POA: Insufficient documentation

## 2013-06-13 DIAGNOSIS — Y9389 Activity, other specified: Secondary | ICD-10-CM | POA: Insufficient documentation

## 2013-06-13 DIAGNOSIS — Z8679 Personal history of other diseases of the circulatory system: Secondary | ICD-10-CM | POA: Insufficient documentation

## 2013-06-13 DIAGNOSIS — Z8742 Personal history of other diseases of the female genital tract: Secondary | ICD-10-CM | POA: Insufficient documentation

## 2013-06-13 DIAGNOSIS — W268XXA Contact with other sharp object(s), not elsewhere classified, initial encounter: Secondary | ICD-10-CM | POA: Insufficient documentation

## 2013-06-13 DIAGNOSIS — Y9289 Other specified places as the place of occurrence of the external cause: Secondary | ICD-10-CM | POA: Insufficient documentation

## 2013-06-13 MED ORDER — TETANUS-DIPHTH-ACELL PERTUSSIS 5-2.5-18.5 LF-MCG/0.5 IM SUSP
0.5000 mL | Freq: Once | INTRAMUSCULAR | Status: AC
  Administered 2013-06-13: 0.5 mL via INTRAMUSCULAR
  Filled 2013-06-13: qty 0.5

## 2013-06-13 NOTE — ED Provider Notes (Signed)
CSN: [id number]     Arrival date & time 06/13/13  2003 History  This chart was scribed for non-physician practitioner Dierdre Forth, PA-C working with Juliet Rude. Rubin Payor, MD by Deborah Riddle, ED Scribe. This patient was seen in room TR07C/TR07C and the patient's care was started at 8:32 PM.   Chief Complaint  Patient presents with  . Laceration   The history is provided by the patient. No language interpreter was used.   HPI Comments: Deborah Riddle is a 27 y.o. female who presents to the Emergency Department complaining of laceration to the  aspect of her right hand after slicing it while opening a can of beans 30 minutes ago. She is not on any blood thinners. She is allergic to heparin. Bleeding is controlled and nothing makes it better or worse.  Pt denies fever, chills, N/V, weakness, syncope.    Past Medical History  Diagnosis Date  . Chest pain, unspecified   . Morbid obesity   . Tobacco use disorder   . Body mass index 40.0-44.9, adult   . Paroxysmal atrial fibrillation   . Polycystic disease, ovaries    History reviewed. No pertinent past surgical history. Family History  Problem Relation Age of Onset  . Arrhythmia Mother 16    History of Cardiac Stents  . Diabetes      Family History  . Hypertension      Family History   History  Substance Use Topics  . Smoking status: Former Smoker -- 1.00 packs/day for 5 years    Types: Cigarettes    Quit date: 09/10/2011  . Smokeless tobacco: Never Used  . Alcohol Use: Yes     Comment: Drinks alcohol on social occasions    OB History   Grav Para Term Preterm Abortions TAB SAB Ect Mult Living                 Review of Systems  Constitutional: Negative for fever, diaphoresis, appetite change, fatigue and unexpected weight change.  HENT: Negative for mouth sores.   Eyes: Negative for visual disturbance.  Respiratory: Negative for cough, chest tightness, shortness of breath and wheezing.   Cardiovascular:  Negative for chest pain.  Gastrointestinal: Negative for nausea, vomiting, abdominal pain, diarrhea and constipation.  Endocrine: Negative for polydipsia, polyphagia and polyuria.  Genitourinary: Negative for dysuria, urgency, frequency and hematuria.  Musculoskeletal: Negative for back pain and neck stiffness.  Skin: Positive for wound. Negative for rash.  Allergic/Immunologic: Negative for immunocompromised state.  Neurological: Negative for syncope, light-headedness and headaches.  Hematological: Does not bruise/bleed easily.  Psychiatric/Behavioral: Negative for sleep disturbance. The patient is not nervous/anxious.     Allergies  Heparin  Home Medications   Current Outpatient Rx  Name  Route  Sig  Dispense  Refill  . metFORMIN (GLUCOPHAGE) 500 MG tablet   Oral   Take 500 mg by mouth 2 (two) times daily with a meal.         . EXPIRED: verapamil (CALAN-SR) 180 MG CR tablet   Oral   Take 1 tablet (180 mg total) by mouth daily.   30 tablet   11    BP 135/87  Pulse 90  Temp(Src) 98.9 F (37.2 C) (Oral)  Resp 14  SpO2 95% Physical Exam  Nursing note and vitals reviewed. Constitutional: She is oriented to person, place, and time. She appears well-developed and well-nourished. No distress.  HENT:  Head: Normocephalic and atraumatic.  Eyes: Conjunctivae are normal. No scleral icterus.  Neck:  Normal range of motion.  Cardiovascular: Normal rate, regular rhythm, normal heart sounds and intact distal pulses.   No murmur heard. Capillary refill < 3 sec  Pulmonary/Chest: Effort normal and breath sounds normal. No respiratory distress.  Musculoskeletal: Normal range of motion. She exhibits no edema.  ROM: full  Neurological: She is alert and oriented to person, place, and time.  Sensation: in tact Strength: 5/5 in the fingers with resisted flexion and extension, strong grip strength  Skin: Skin is warm and dry. She is not diaphoretic.  3cm laceration to the medial aspect  of the right palm  Psychiatric: She has a normal mood and affect.    ED Course  Procedures (including critical care time) Medications  Tdap (BOOSTRIX) injection 0.5 mL (not administered)    DIAGNOSTIC STUDIES: Oxygen Saturation is 95% on RA, normal by my interpretation.    COORDINATION OF CARE: 8:39 PM- Discussed treatment plan with pt which includes lac repair. Pt agrees to plan.  LACERATION REPAIR Performed by: Dierdre Forth, PA-C  Consent: Verbal consent obtained. Risks and benefits: risks, benefits and alternatives were discussed Patient identity confirmed: provided demographic data Time out performed prior to procedure Prepped and Draped in normal sterile fashion Wound explored Laceration Location: right hand Laceration Length: 3 cm No Foreign Bodies seen or palpated Anesthesia: local infiltration Local anesthetic: lidocaine 2% without epinephrine Anesthetic total: 4 ml Irrigation method: syringe Amount of cleaning: standard Skin closure: sutures Number of sutures or staples: 3 Technique: 5 0 proline Patient tolerance: Patient tolerated the procedure well with no immediate complications.   Labs Review Labs Reviewed - No data to display Imaging Review No results found.  EKG Interpretation   None       MDM   1. Hand laceration, right, initial encounter      [name] [facility] presents with right hand laceration.  Tdap booster given. Pressure irrigation performed. Laceration occurred < 8 hours prior to repair which was well tolerated. Pt has no co morbidities to effect normal wound healing. Discussed suture home care w pt and answered questions. Pt to f-u for wound check and suture removal in 7 days. Pt is hemodynamically stable w no complaints prior to dc.    It has been determined that no acute conditions requiring further emergency intervention are present at this time. The patient/guardian have been advised of the diagnosis and plan. We have  discussed signs and symptoms that warrant return to the ED, such as changes or worsening in symptoms.   Vital signs are stable at discharge.   BP 135/87  Pulse 90  Temp(Src) 98.9 F (37.2 C) (Oral)  Resp 14  SpO2 95%  Patient/guardian has voiced understanding and agreed to follow-up with the PCP or specialist.       Dierdre Forth, PA-C 06/13/13 2102

## 2013-06-13 NOTE — ED Notes (Signed)
Pt was opening can of beans and cut right lateral side of hand.

## 2013-06-13 NOTE — ED Notes (Signed)
PA at bedside suturing.

## 2013-06-14 NOTE — ED Provider Notes (Signed)
Medical screening examination/treatment/procedure(s) were performed by non-physician practitioner and as supervising physician I was immediately available for consultation/collaboration.  EKG Interpretation   None        Adison Reifsteck R. Natalyia Innes, MD 06/14/13 0003 

## 2013-06-17 ENCOUNTER — Encounter (HOSPITAL_COMMUNITY): Payer: Self-pay | Admitting: Emergency Medicine

## 2013-06-17 ENCOUNTER — Emergency Department (HOSPITAL_COMMUNITY)
Admission: EM | Admit: 2013-06-17 | Discharge: 2013-06-17 | Disposition: A | Discharge status: Home / Self Care | Payer: Managed Care, Other (non HMO) | Attending: Emergency Medicine | Admitting: Emergency Medicine

## 2013-06-17 DIAGNOSIS — Y939 Activity, unspecified: Secondary | ICD-10-CM | POA: Insufficient documentation

## 2013-06-17 DIAGNOSIS — S61409A Unspecified open wound of unspecified hand, initial encounter: Secondary | ICD-10-CM | POA: Insufficient documentation

## 2013-06-17 DIAGNOSIS — X58XXXA Exposure to other specified factors, initial encounter: Secondary | ICD-10-CM | POA: Insufficient documentation

## 2013-06-17 DIAGNOSIS — Z4802 Encounter for removal of sutures: Secondary | ICD-10-CM

## 2013-06-17 DIAGNOSIS — Z87891 Personal history of nicotine dependence: Secondary | ICD-10-CM | POA: Insufficient documentation

## 2013-06-17 DIAGNOSIS — Z79899 Other long term (current) drug therapy: Secondary | ICD-10-CM | POA: Insufficient documentation

## 2013-06-17 DIAGNOSIS — Z8679 Personal history of other diseases of the circulatory system: Secondary | ICD-10-CM | POA: Insufficient documentation

## 2013-06-17 DIAGNOSIS — Y929 Unspecified place or not applicable: Secondary | ICD-10-CM | POA: Insufficient documentation

## 2013-06-17 NOTE — ED Notes (Signed)
Pt here for suture removal

## 2013-06-17 NOTE — ED Notes (Signed)
Discharge instructions reviewed with pt. Pt verbalized understanding.   

## 2013-06-17 NOTE — ED Provider Notes (Signed)
Medical screening examination/treatment/procedure(s) were performed by non-physician practitioner and as supervising physician I was immediately available for consultation/collaboration.  Glady Ouderkirk L Welda Azzarello, MD 06/17/13 1703 

## 2013-06-17 NOTE — ED Provider Notes (Signed)
CSN: [id number]     Arrival date & time 06/17/13  1245 History  This chart was scribed for non-physician practitioner, Johnnette Gourd, PA-C working with Flint Melter, MD by Greggory Stallion, ED scribe. This patient was seen in room TR07C/TR07C and the patient's care was started at 1:02 PM.   Chief Complaint  Patient presents with  . Suture / Staple Removal   The history is provided by the patient. No language interpreter was used.   HPI Comments: Deborah Riddle is a 27 y.o. female who presents to the Emergency Department stating she was here 4 days ago for laceration repair on her right hand. Pt had 3 sutures placed. She states the sutures have started to come out. Pt denies discharge from wound. Denies hand pain.   Past Medical History  Diagnosis Date  . Chest pain, unspecified   . Morbid obesity   . Tobacco use disorder   . Body mass index 40.0-44.9, adult   . Paroxysmal atrial fibrillation   . Polycystic disease, ovaries    History reviewed. No pertinent past surgical history. Family History  Problem Relation Age of Onset  . Arrhythmia Mother 2    History of Cardiac Stents  . Diabetes      Family History  . Hypertension      Family History   History  Substance Use Topics  . Smoking status: Former Smoker -- 1.00 packs/day for 5 years    Types: Cigarettes    Quit date: 09/10/2011  . Smokeless tobacco: Never Used  . Alcohol Use: Yes     Comment: Drinks alcohol on social occasions    OB History   Grav Para Term Preterm Abortions TAB SAB Ect Mult Living                 Review of Systems  Musculoskeletal: Negative for arthralgias.  Skin: Positive for wound (healing).  All other systems reviewed and are negative.    Allergies  Heparin  Home Medications   Current Outpatient Rx  Name  Route  Sig  Dispense  Refill  . metFORMIN (GLUCOPHAGE) 500 MG tablet   Oral   Take 500 mg by mouth 2 (two) times daily with a meal.         . EXPIRED: verapamil (CALAN-SR)  180 MG CR tablet   Oral   Take 1 tablet (180 mg total) by mouth daily.   30 tablet   11    BP 113/49  Pulse 75  Temp(Src) 98.3 F (36.8 C) (Oral)  Resp 20  Ht 5\' 8"  (1.727 m)  Wt 290 lb (131.543 kg)  BMI 44.10 kg/m2  SpO2 98%  Physical Exam  Nursing note and vitals reviewed. Constitutional: She is oriented to person, place, and time. She appears well-developed and well-nourished. No distress.  HENT:  Head: Normocephalic and atraumatic.  Mouth/Throat: Oropharynx is clear and moist.  Eyes: Conjunctivae and EOM are normal.  Neck: Normal range of motion. Neck supple.  Cardiovascular: Normal rate, regular rhythm and normal heart sounds.   Pulmonary/Chest: Effort normal and breath sounds normal. No respiratory distress.  Musculoskeletal: Normal range of motion. She exhibits no edema.  Neurological: She is alert and oriented to person, place, and time. No sensory deficit.  Skin: Skin is warm and dry.  Medial aspect of right hand on the palmar side has a 1.5 cm well healing laceration with one suture in place and 2 unraveling sutures. No discharge or signs of infection. Slightly open.  Psychiatric: She has a normal mood and affect. Her behavior is normal.    ED Course  Procedures (including critical care time) SUTURE REMOVAL Performed by: Johnnette Gourd  Consent: Verbal consent obtained. Patient identity confirmed: provided demographic data Time out: Immediately prior to procedure a "time out" was called to verify the correct patient, procedure, equipment, support staff and site/side marked as required.  Location details: right hand  Wound Appearance: clean  Sutures/Staples Removed: 3  Facility: sutures placed in this facility Patient tolerance: Patient tolerated the procedure well with no immediate complications.    DIAGNOSTIC STUDIES: Oxygen Saturation is 98% on RA, normal by my interpretation.    COORDINATION OF CARE: 1:03 PM-Discussed treatment plan which  includes removal of 3 sutures and putting a steri strip on with pt at bedside and pt agreed to plan.   Labs Review Labs Reviewed - No data to display Imaging Review No results found.  EKG Interpretation   None       MDM   1. Visit for suture removal    Steri-strips applied as wound was slightly open. No signs of infection. Return precautions given. Patient states understanding of treatment care plan and is agreeable.    I personally performed the services described in this documentation, which was scribed in my presence. The recorded information has been reviewed and is accurate.   Trevor Mace, PA-C 06/17/13 1312

## 2013-06-17 NOTE — ED Notes (Signed)
Pt was here Sunday for laceration and had sutures placed to R hand. States now the sutures are coming out. Pt denies pain. No signs of infection

## 2013-09-23 ENCOUNTER — Encounter (HOSPITAL_COMMUNITY): Payer: Self-pay | Admitting: Emergency Medicine

## 2013-09-23 ENCOUNTER — Emergency Department (HOSPITAL_COMMUNITY)
Admission: EM | Admit: 2013-09-23 | Discharge: 2013-09-23 | Disposition: A | Discharge status: Home / Self Care | Payer: BC Managed Care – PPO | Attending: Emergency Medicine | Admitting: Emergency Medicine

## 2013-09-23 ENCOUNTER — Emergency Department (HOSPITAL_COMMUNITY): Payer: BC Managed Care – PPO

## 2013-09-23 DIAGNOSIS — Z8742 Personal history of other diseases of the female genital tract: Secondary | ICD-10-CM | POA: Insufficient documentation

## 2013-09-23 DIAGNOSIS — Z8679 Personal history of other diseases of the circulatory system: Secondary | ICD-10-CM | POA: Insufficient documentation

## 2013-09-23 DIAGNOSIS — Z87891 Personal history of nicotine dependence: Secondary | ICD-10-CM | POA: Insufficient documentation

## 2013-09-23 DIAGNOSIS — Z3202 Encounter for pregnancy test, result negative: Secondary | ICD-10-CM | POA: Insufficient documentation

## 2013-09-23 DIAGNOSIS — R0789 Other chest pain: Secondary | ICD-10-CM | POA: Insufficient documentation

## 2013-09-23 DIAGNOSIS — Z8659 Personal history of other mental and behavioral disorders: Secondary | ICD-10-CM | POA: Insufficient documentation

## 2013-09-23 DIAGNOSIS — Z79899 Other long term (current) drug therapy: Secondary | ICD-10-CM | POA: Insufficient documentation

## 2013-09-23 LAB — COMPREHENSIVE METABOLIC PANEL
ALBUMIN: 3.6 g/dL (ref 3.5–5.2)
ALT: 16 U/L (ref 0–35)
AST: 21 U/L (ref 0–37)
Alkaline Phosphatase: 85 U/L (ref 39–117)
BUN: 10 mg/dL (ref 6–23)
CALCIUM: 9.3 mg/dL (ref 8.4–10.5)
CO2: 26 mEq/L (ref 19–32)
CREATININE: 0.86 mg/dL (ref 0.50–1.10)
Chloride: 101 mEq/L (ref 96–112)
GFR calc Af Amer: >90 mL/min (ref >90)
Glucose, Bld: 99 mg/dL (ref 70–99)
Potassium: 4 mEq/L (ref 3.7–5.3)
Sodium: 138 mEq/L (ref 137–147)
Total Bilirubin: 0.2 mg/dL — ABNORMAL LOW (ref 0.3–1.2)
Total Protein: 7.8 g/dL (ref 6.0–8.3)

## 2013-09-23 LAB — CBC WITH DIFFERENTIAL/PLATELET
BASOS ABS: 0 10*3/uL (ref 0.0–0.1)
BASOS PCT: 0 % (ref 0–1)
EOS ABS: 0.2 10*3/uL (ref 0.0–0.7)
EOS PCT: 2 % (ref 0–5)
HEMATOCRIT: 38.2 % (ref 36.0–46.0)
Hemoglobin: 13.3 g/dL (ref 12.0–15.0)
Lymphocytes Relative: 35 % (ref 12–46)
Lymphs Abs: 3.4 10*3/uL (ref 0.7–4.0)
MCH: 25.7 pg — AB (ref 26.0–34.0)
MCHC: 34.8 g/dL (ref 30.0–36.0)
MCV: 73.7 fL — AB (ref 78.0–100.0)
MONO ABS: 0.5 10*3/uL (ref 0.1–1.0)
Monocytes Relative: 5 % (ref 3–12)
Neutro Abs: 5.8 10*3/uL (ref 1.7–7.7)
Neutrophils Relative %: 58 % (ref 43–77)
Platelets: 295 10*3/uL (ref 150–400)
RBC: 5.18 MIL/uL — ABNORMAL HIGH (ref 3.87–5.11)
RDW: 15.3 % (ref 11.5–15.5)
WBC: 9.9 10*3/uL (ref 4.0–10.5)

## 2013-09-23 LAB — URINALYSIS, ROUTINE W REFLEX MICROSCOPIC
Bilirubin Urine: NEGATIVE
Glucose, UA: NEGATIVE mg/dL
Hgb urine dipstick: NEGATIVE
KETONES UR: NEGATIVE mg/dL
LEUKOCYTES UA: NEGATIVE
NITRITE: NEGATIVE
PH: 6.5 (ref 5.0–8.0)
Protein, ur: NEGATIVE mg/dL
SPECIFIC GRAVITY, URINE: 1.02 (ref 1.005–1.030)
UROBILINOGEN UA: 0.2 mg/dL (ref 0.0–1.0)

## 2013-09-23 LAB — TROPONIN I

## 2013-09-23 LAB — PREGNANCY, URINE: PREG TEST UR: NEGATIVE

## 2013-09-23 MED ORDER — LORAZEPAM 1 MG PO TABS
1.0000 mg | ORAL_TABLET | Freq: Once | ORAL | Status: AC
  Administered 2013-09-23: 1 mg via ORAL
  Filled 2013-09-23: qty 1

## 2013-09-23 NOTE — ED Notes (Signed)
Patient reports centralized chest pain that feels like cramping that started approximately an hour ago. Reports history of afib.

## 2013-09-23 NOTE — Discharge Instructions (Signed)
Chest Pain (Nonspecific) There is no evidence of heart attack or blood clot in the lung. Follow up with your doctor. Return to the ED if you develop new or worsening symptoms. It is often hard to give a specific diagnosis for the cause of chest pain. There is always a chance that your pain could be related to something serious, such as a heart attack or a blood clot in the lungs. You need to follow up with your caregiver for further evaluation. CAUSES  Heartburn. Pneumonia or bronchitis. Anxiety or stress. Inflammation around your heart (pericarditis) or lung (pleuritis or pleurisy). A blood clot in the lung. A collapsed lung (pneumothorax). It can develop suddenly on its own (spontaneous pneumothorax) or from injury (trauma) to the chest. Shingles infection (herpes zoster virus). The chest wall is composed of bones, muscles, and cartilage. Any of these can be the source of the pain. The bones can be bruised by injury. The muscles or cartilage can be strained by coughing or overwork. The cartilage can be affected by inflammation and become sore (costochondritis). DIAGNOSIS  Lab tests or other studies, such as X-rays, electrocardiography, stress testing, or cardiac imaging, may be needed to find the cause of your pain.  TREATMENT  Treatment depends on what may be causing your chest pain. Treatment may include: Acid blockers for heartburn. Anti-inflammatory medicine. Pain medicine for inflammatory conditions. Antibiotics if an infection is present. You may be advised to change lifestyle habits. This includes stopping smoking and avoiding alcohol, caffeine, and chocolate. You may be advised to keep your head raised (elevated) when sleeping. This reduces the chance of acid going backward from your stomach into your esophagus. Most of the time, nonspecific chest pain will improve within 2 to 3 days with rest and mild pain medicine. HOME CARE INSTRUCTIONS  If  antibiotics were prescribed, take your antibiotics as directed. Finish them even if you start to feel better. For the next few days, avoid physical activities that bring on chest pain. Continue physical activities as directed. Do not smoke. Avoid drinking alcohol. Only take over-the-counter or prescription medicine for pain, discomfort, or fever as directed by your caregiver. Follow your caregiver's suggestions for further testing if your chest pain does not go away. Keep any follow-up appointments you made. If you do not go to an appointment, you could develop lasting (chronic) problems with pain. If there is any problem keeping an appointment, you must call to reschedule. SEEK MEDICAL CARE IF:  You think you are having problems from the medicine you are taking. Read your medicine instructions carefully. Your chest pain does not go away, even after treatment. You develop a rash with blisters on your chest. SEEK IMMEDIATE MEDICAL CARE IF:  You have increased chest pain or pain that spreads to your arm, neck, jaw, back, or abdomen. You develop shortness of breath, an increasing cough, or you are coughing up blood. You have severe back or abdominal pain, feel nauseous, or vomit. You develop severe weakness, fainting, or chills. You have a fever. THIS IS AN EMERGENCY. Do not wait to see if the pain will go away. Get medical help at once. Call your local emergency services (911 in [place] Do not drive yourself to the hospital. MAKE SURE YOU:  Understand these instructions. Will watch your condition. Will get help right away if you are not doing well  or get worse. Document Released: 04/10/2005 Document Revised: 09/23/2011 Document Reviewed: 02/04/2008 [facility] Patient Information 2014 [place], [place].   Will watch your condition.  Will get help right away if you are not doing well or get worse. Document Released: 08/03/2010 Document Revised: 03/03/2013 Document Reviewed: 12/31/2012 [facility]  Patient Information 2014 [place], [place].

## 2013-09-23 NOTE — ED Provider Notes (Signed)
CSN: [id number]     Arrival date & time 09/23/13  1944 History   First MD Initiated Contact with Patient 09/23/13 2045     Chief Complaint  Patient presents with  . Chest Pain     (Consider location/radiation/quality/duration/timing/severity/associated sxs/prior Treatment) HPI Comments: Patient presents with substernal chest pain that onset while she was driving home. In the center of her chest as a cramping sensation that lasts for a few seconds and goes away it comes back a few seconds later. Pain does not radiate. She denies any shortness of breath, nausea, diaphoresis or syncope. She's had similar pain in the past but never this severe. She has a history of paroxysmal atrial fibrillation but stopped taking her verapamil years ago. She endorses a history of anxiety. She denies any shortness of breath, leg pain or leg swelling. She denies any abdominal pain, nausea or vomiting.  The history is provided by the patient.    Past Medical History  Diagnosis Date  . Chest pain, unspecified   . Morbid obesity   . Tobacco use disorder   . Body mass index 40.0-44.9, adult   . Paroxysmal atrial fibrillation   . Polycystic disease, ovaries    History reviewed. No pertinent past surgical history. Family History  Problem Relation Age of Onset  . Arrhythmia Mother 2950    History of Cardiac Stents  . Diabetes      Family History  . Hypertension      Family History   History  Substance Use Topics  . Smoking status: Former Smoker -- 1.00 packs/day for 5 years    Types: Cigarettes    Quit date: 09/10/2011  . Smokeless tobacco: Never Used  . Alcohol Use: Yes     Comment: Drinks alcohol on social occasions    OB History   Grav Para Term Preterm Abortions TAB SAB Ect Mult Living                 Review of Systems  Constitutional: Negative for fever.  HENT: Negative for congestion and rhinorrhea.   Eyes: Negative for visual disturbance.  Respiratory: Positive for chest tightness.  Negative for cough and shortness of breath.   Cardiovascular: Positive for chest pain.  Gastrointestinal: Negative for nausea, vomiting and abdominal pain.  Genitourinary: Negative for dysuria, hematuria, vaginal bleeding and vaginal discharge.  Musculoskeletal: Negative for arthralgias and back pain.  Skin: Negative for rash.  Neurological: Negative for dizziness, weakness and headaches.  A complete 10 system review of systems was obtained and all systems are negative except as noted in the HPI and PMH.      Allergies  Heparin  Home Medications   Current Outpatient Rx  Name  Route  Sig  Dispense  Refill  . metFORMIN (GLUCOPHAGE) 500 MG tablet   Oral   Take 500 mg by mouth 2 (two) times daily with a meal.          BP 150/88  Pulse 82  Temp(Src) 98.3 F (36.8 C) (Oral)  Resp 22  Ht 5\' 8"  (1.727 m)  Wt 280 lb (127.007 kg)  BMI 42.58 kg/m2  SpO2 100%  LMP 07/15/2013 Physical Exam  Constitutional: She is oriented to person, place, and time. She appears well-developed and well-nourished. No distress.  HENT:  Head: Normocephalic and atraumatic.  Mouth/Throat: Oropharynx is clear and moist. No oropharyngeal exudate.  Eyes: Conjunctivae and EOM are normal. Pupils are equal, round, and reactive to light.  Neck: Normal range of motion. Neck  supple.  Cardiovascular: Normal rate, regular rhythm and normal heart sounds.   No murmur heard. Pulmonary/Chest: Effort normal and breath sounds normal. No respiratory distress. She exhibits tenderness.  Abdominal: Soft. There is no tenderness. There is no rebound and no guarding.  Musculoskeletal: Normal range of motion. She exhibits no edema and no tenderness.  Neurological: She is alert and oriented to person, place, and time. No cranial nerve deficit. She exhibits normal muscle tone. Coordination normal.  Skin: Skin is warm.    ED Course  Procedures (including critical care time) Labs Review Labs Reviewed  CBC WITH DIFFERENTIAL -  Abnormal; Notable for the following:    RBC 5.18 (*)    MCV 73.7 (*)    MCH 25.7 (*)    All other components within normal limits  COMPREHENSIVE METABOLIC PANEL - Abnormal; Notable for the following:    Total Bilirubin 0.2 (*)    All other components within normal limits  TROPONIN I  PREGNANCY, URINE  URINALYSIS, ROUTINE W REFLEX MICROSCOPIC   Imaging Review Dg Chest 2 View  09/23/2013   CLINICAL DATA:  Chest pain  EXAM: CHEST  2 VIEW  COMPARISON:  None.  FINDINGS: The heart size and mediastinal contours are within normal limits. Both lungs are clear. The visualized skeletal structures are unremarkable.  IMPRESSION: No active cardiopulmonary disease.   Electronically Signed   By: Ruel Favors M.D.   On: 09/23/2013 21:57     EKG Interpretation   Date/Time:  Thursday September 23 2013 20:09:02 EDT Ventricular Rate:  81 PR Interval:  166 QRS Duration: 80 QT Interval:  384 QTC Calculation: 446 R Axis:   39 Text Interpretation:  Normal sinus rhythm Nonspecific T wave abnormality  Abnormal ECG When compared with ECG of 22-Sep-2011 03:32, Sinus rhythm has  replaced Atrial fibrillation now sinus Confirmed by Adreona Brand  MD, Berlene Dixson  [phone]) on 09/23/2013 9:04:31 PM      MDM   Final diagnoses:  Atypical chest pain   Cramping sensation in chest lasting a few seconds at a time. Atypical for ACS. EKG is sinus rhythm with nonspecific ST changes.  EKG normal sinus rhythm with nonspecific ST changes. Troponin negative. Chest x-ray negative. Symptoms improved with Ativan. No further cramping in the chest. Pain is atypical for ACS. Atypical for PE. No hypoxia or tachycardia.  Follow up with her doctor and cardiology. Return precautions discussed.  Glynn Octave, MD 09/23/13 2216

## 2013-11-07 ENCOUNTER — Emergency Department (HOSPITAL_COMMUNITY)
Admission: EM | Admit: 2013-11-07 | Discharge: 2013-11-07 | Disposition: A | Discharge status: Home / Self Care | Payer: BC Managed Care – PPO | Attending: Emergency Medicine | Admitting: Emergency Medicine

## 2013-11-07 ENCOUNTER — Other Ambulatory Visit: Payer: Self-pay

## 2013-11-07 ENCOUNTER — Encounter (HOSPITAL_COMMUNITY): Payer: Self-pay | Admitting: Emergency Medicine

## 2013-11-07 DIAGNOSIS — I480 Paroxysmal atrial fibrillation: Secondary | ICD-10-CM

## 2013-11-07 DIAGNOSIS — I4891 Unspecified atrial fibrillation: Secondary | ICD-10-CM | POA: Insufficient documentation

## 2013-11-07 DIAGNOSIS — Z8742 Personal history of other diseases of the female genital tract: Secondary | ICD-10-CM | POA: Insufficient documentation

## 2013-11-07 DIAGNOSIS — Z79899 Other long term (current) drug therapy: Secondary | ICD-10-CM | POA: Insufficient documentation

## 2013-11-07 DIAGNOSIS — Z87891 Personal history of nicotine dependence: Secondary | ICD-10-CM | POA: Insufficient documentation

## 2013-11-07 LAB — CBC WITH DIFFERENTIAL/PLATELET
Basophils Absolute: 0.1 10*3/uL (ref 0.0–0.1)
Basophils Relative: 1 % (ref 0–1)
Eosinophils Absolute: 0.4 10*3/uL (ref 0.0–0.7)
Eosinophils Relative: 4 % (ref 0–5)
HCT: 39.2 % (ref 36.0–46.0)
HEMOGLOBIN: 13.6 g/dL (ref 12.0–15.0)
LYMPHS ABS: 3.3 10*3/uL (ref 0.7–4.0)
LYMPHS PCT: 39 % (ref 12–46)
MCH: 25.3 pg — ABNORMAL LOW (ref 26.0–34.0)
MCHC: 34.7 g/dL (ref 30.0–36.0)
MCV: 72.9 fL — ABNORMAL LOW (ref 78.0–100.0)
MONO ABS: 0.5 10*3/uL (ref 0.1–1.0)
MONOS PCT: 6 % (ref 3–12)
NEUTROS ABS: 4.4 10*3/uL (ref 1.7–7.7)
Neutrophils Relative %: 50 % (ref 43–77)
Platelets: 262 10*3/uL (ref 150–400)
RBC: 5.38 MIL/uL — ABNORMAL HIGH (ref 3.87–5.11)
RDW: 15.3 % (ref 11.5–15.5)
WBC: 8.7 10*3/uL (ref 4.0–10.5)

## 2013-11-07 LAB — BASIC METABOLIC PANEL
BUN: 10 mg/dL (ref 6–23)
CHLORIDE: 103 meq/L (ref 96–112)
CO2: 25 meq/L (ref 19–32)
CREATININE: 0.82 mg/dL (ref 0.50–1.10)
Calcium: 9.2 mg/dL (ref 8.4–10.5)
GFR calc Af Amer: >90 mL/min (ref >90)
GFR calc non Af Amer: >90 mL/min (ref >90)
GLUCOSE: 112 mg/dL — AB (ref 70–99)
Potassium: 3.7 mEq/L (ref 3.7–5.3)
Sodium: 139 mEq/L (ref 137–147)

## 2013-11-07 LAB — TROPONIN I: Troponin I: <0.3 ng/mL (ref <0.30)

## 2013-11-07 LAB — CBG MONITORING, ED: Glucose-Capillary: 111 mg/dL — ABNORMAL HIGH (ref 70–99)

## 2013-11-07 MED ORDER — FLECAINIDE ACETATE 100 MG PO TABS
ORAL_TABLET | ORAL | Status: AC
  Filled 2013-11-07: qty 3

## 2013-11-07 MED ORDER — METOPROLOL TARTRATE 1 MG/ML IV SOLN
5.0000 mg | Freq: Once | INTRAVENOUS | Status: AC
  Administered 2013-11-07: 5 mg via INTRAVENOUS
  Filled 2013-11-07: qty 5

## 2013-11-07 MED ORDER — ACETAMINOPHEN 325 MG PO TABS
650.0000 mg | ORAL_TABLET | Freq: Once | ORAL | Status: AC
  Administered 2013-11-07: 650 mg via ORAL
  Filled 2013-11-07: qty 2

## 2013-11-07 MED ORDER — FLECAINIDE ACETATE 100 MG PO TABS
300.0000 mg | ORAL_TABLET | Freq: Once | ORAL | Status: AC
  Administered 2013-11-07: 300 mg via ORAL
  Filled 2013-11-07: qty 3

## 2013-11-07 NOTE — ED Notes (Addendum)
Pt rhythm has changed to NSR, rate in mid 90's, pt states that she is feeling better and headache has decreased, Dr Adriana Simasook notified of pt's rhythm change,

## 2013-11-07 NOTE — ED Notes (Signed)
Dr Cook at bedside,  

## 2013-11-07 NOTE — ED Provider Notes (Signed)
CSN: [id number]     Arrival date & time 11/07/13  0408 History   First MD Initiated Contact with Patient 11/07/13 [phone]     Chief Complaint  Patient presents with  . Palpitations     (Consider location/radiation/quality/duration/timing/severity/associated sxs/prior Treatment) Patient is a 28 y.o. female presenting with palpitations. The history is provided by the patient.  Palpitations She has a history of paroxysmal atrial fibrillation. She was awakened at about midnight with a sense that her heart was racing and fluttering. There is no associated chest pain, heaviness, tightness, pressure. There is no nausea or vomiting. There is no diaphoresis or dizziness. Usually, her heart rhythm goes back to normal in a fairly short period of time but this time she was not returning to normal. She is not taking any medication for it. She denies any recent caffeine ingestion or alcohol ingestion.  Past Medical History  Diagnosis Date  . Chest pain, unspecified   . Morbid obesity   . Tobacco use disorder   . Body mass index 40.0-44.9, adult   . Paroxysmal atrial fibrillation   . Polycystic disease, ovaries    History reviewed. No pertinent past surgical history. Family History  Problem Relation Age of Onset  . Arrhythmia Mother 550    History of Cardiac Stents  . Diabetes      Family History  . Hypertension      Family History   History  Substance Use Topics  . Smoking status: Former Smoker -- 1.00 packs/day for 5 years    Types: Cigarettes    Quit date: 09/10/2011  . Smokeless tobacco: Never Used  . Alcohol Use: Yes     Comment: Drinks alcohol on social occasions    OB History   Grav Para Term Preterm Abortions TAB SAB Ect Mult Living                 Review of Systems  Cardiovascular: Positive for palpitations.  All other systems reviewed and are negative.     Allergies  Heparin  Home Medications   Prior to Admission medications   Medication Sig Start Date End Date  Taking? Authorizing Provider  metFORMIN (GLUCOPHAGE) 500 MG tablet Take 500 mg by mouth 2 (two) times daily with a meal.   Yes Historical Provider, MD   BP 153/90  Pulse 127  Temp(Src) 97.5 F (36.4 C)  Resp 20  Ht 5\' 8"  (1.727 m)  Wt 285 lb (129.275 kg)  BMI 43.34 kg/m2  SpO2 100% Physical Exam  Nursing note and vitals reviewed.  28 year old female, resting comfortably and in no acute distress. Vital signs are significant for tachycardia with heart rate 127, and hypertension with blood pressure 153/90. Oxygen saturation is 100%, which is normal. Head is normocephalic and atraumatic. PERRLA, EOMI. Oropharynx is clear. Neck is nontender and supple without adenopathy or JVD. Back is nontender and there is no CVA tenderness. Lungs are clear without rales, wheezes, or rhonchi. Chest is nontender. Heart is tachycardic and irregular without murmur. Abdomen is soft, flat, nontender without masses or hepatosplenomegaly and peristalsis is normoactive. Extremities trace edema, full range of motion is present. Skin is warm and dry without rash. Neurologic: Mental status is normal, cranial nerves are intact, there are no motor or sensory deficits.  ED Course  Procedures (including critical care time) Labs Review Results for orders placed during the hospital encounter of 11/07/13  CBC WITH DIFFERENTIAL      Result Value Ref Range   WBC  8.7  4.0 - 10.5 K/uL   RBC 5.38 (*) 3.87 - 5.11 MIL/uL   Hemoglobin 13.6  12.0 - 15.0 g/dL   HCT 16.139.2  09.636.0 - 04.546.0 %   MCV 72.9 (*) 78.0 - 100.0 fL   MCH 25.3 (*) 26.0 - 34.0 pg   MCHC 34.7  30.0 - 36.0 g/dL   RDW 40.915.3  81.111.5 - 91.415.5 %   Platelets 262  150 - 400 K/uL   Neutrophils Relative % 50  43 - 77 %   Neutro Abs 4.4  1.7 - 7.7 K/uL   Lymphocytes Relative 39  12 - 46 %   Lymphs Abs 3.3  0.7 - 4.0 K/uL   Monocytes Relative 6  3 - 12 %   Monocytes Absolute 0.5  0.1 - 1.0 K/uL   Eosinophils Relative 4  0 - 5 %   Eosinophils Absolute 0.4  0.0 - 0.7  K/uL   Basophils Relative 1  0 - 1 %   Basophils Absolute 0.1  0.0 - 0.1 K/uL  BASIC METABOLIC PANEL      Result Value Ref Range   Sodium 139  137 - 147 mEq/L   Potassium 3.7  3.7 - 5.3 mEq/L   Chloride 103  96 - 112 mEq/L   CO2 25  19 - 32 mEq/L   Glucose, Bld 112 (*) 70 - 99 mg/dL   BUN 10  6 - 23 mg/dL   Creatinine, Ser 7.820.82  0.50 - 1.10 mg/dL   Calcium 9.2  8.4 - 95.610.5 mg/dL   GFR calc non Af Amer >90  >90 mL/min   GFR calc Af Amer >90  >90 mL/min  TROPONIN I      Result Value Ref Range   Troponin I <0.30  <0.30 ng/mL  CBG MONITORING, ED      Result Value Ref Range   Glucose-Capillary 111 (*) 70 - 99 mg/dL    Date: 21/30/865704/26/2015  Rate: 128  Rhythm: atrial fibrillation  QRS Axis: normal  Intervals: normal  ST/T Wave abnormalities: nonspecific ST/T changes  Conduction Disutrbances:none  Narrative Interpretation: Atrial fibrillation with rapid ventricular response, nonspecific ST and T changes which may be rate related. When compared with ECG of 09/23/2013, atrial fibrillation with rapid ventricular response has replaced normal sinus rhythm.  Old EKG Reviewed: changes noted   MDM   Final diagnoses:  Paroxysmal atrial fibrillation    Paroxysmal atrial fibrillation. Old records are reviewed and she had been on flecainide in the past. She has no history of coronary artery disease, so she would be a candidate for single dose flecainide to attempt conversion. She is given a dose of metoprolol for rate control and will be given a dose of flecainide.  Flecainide was given at about 5 AM. At this point, she has not converted to sinus rhythm, but rate was adequately controlled with metoprolol. Case is signed out to Dr. Adriana Simasook.  Dione Boozeavid Cannan Beeck, MD 11/07/13 [phone]

## 2013-11-07 NOTE — ED Notes (Signed)
Patient c/o palpitations since midnight; states usually comes and goes, but is not leaving.

## 2013-11-07 NOTE — ED Notes (Signed)
Pt resting quietly, resp even and non labored,  hr remains in mid 80's, a-fib rhythm on monitor,

## 2013-11-07 NOTE — ED Notes (Addendum)
Received report on pt, pt ambulatory to restroom without assistance, states "I am tired and complaining of headache and sinus pressure" Dr Preston FleetingGlick notified, additional orders given. Pt reports that she has been taking otc medication for her sinus issues, advised pt to make sure whatever medication she has been taking is "ok" because some of the otc medications have side effects of palpitations, pt expressed understanding,  Pt remains in a-fib on monitor,

## 2013-11-07 NOTE — Discharge Instructions (Signed)
Atrial Fibrillation  Atrial fibrillation is a type of irregular heart rhythm (arrhythmia). During atrial fibrillation, the upper chambers of the heart (atria) quiver continuously in a chaotic pattern. This causes an irregular and often rapid heart rate.   Atrial fibrillation is the result of the heart becoming overloaded with disorganized signals that tell it to beat. These signals are normally released one at a time by a part of the right atrium called the sinoatrial node. They then travel from the atria to the lower chambers of the heart (ventricles), causing the atria and ventricles to contract and pump blood as they pass. In atrial fibrillation, parts of the atria outside of the sinoatrial node also release these signals. This results in two problems. First, the atria receive so many signals that they do not have time to fully contract. Second, the ventricles, which can only receive one signal at a time, beat irregularly and out of rhythm with the atria.   There are three types of atrial fibrillation:    Paroxysmal Paroxysmal atrial fibrillation starts suddenly and stops on its own within a week.    Persistent Persistent atrial fibrillation lasts for more than a week. It may stop on its own or with treatment.    Permanent Permanent atrial fibrillation does not go away. Episodes of atrial fibrillation may lead to permanent atrial fibrillation.   Atrial fibrillation can prevent your heart from pumping blood normally. It increases your risk of stroke and can lead to heart failure.   CAUSES    Heart conditions, including a heart attack, heart failure, coronary artery disease, and heart valve conditions.    Inflammation of the sac that surrounds the heart (pericarditis).    Blockage of an artery in the lungs (pulmonary embolism).    Pneumonia or other infections.    Chronic lung disease.    Thyroid problems, especially if the thyroid is overactive (hyperthyroidism).    Caffeine, excessive alcohol  use, and use of some illegal drugs.    Use of some medications, including certain decongestants and diet pills.    Heart surgery.    Birth defects.   Sometimes, no cause can be found. When this happens, the atrial fibrillation is called lone atrial fibrillation. The risk of complications from atrial fibrillation increases if you have lone atrial fibrillation and you are age 60 years or older.  RISK FACTORS   Heart failure.   Coronary artery disease   Diabetes mellitus.    High blood pressure (hypertension).    Obesity.    Other arrhythmias.    Increased age.  SYMPTOMS    A feeling that your heart is beating rapidly or irregularly.    A feeling of discomfort or pain in your chest.    Shortness of breath.    Sudden lightheadedness or weakness.    Getting tired easily when exercising.    Urinating more often than normal (mainly when atrial fibrillation first begins).   In paroxysmal atrial fibrillation, symptoms may start and suddenly stop.  DIAGNOSIS   Your caregiver may be able to detect atrial fibrillation when taking your pulse. Usually, testing is needed to diagnosis atrial fibrillation. Tests may include:    Electrocardiography. During this test, the electrical impulses of your heart are recorded while you are lying down.    Echocardiography. During echocardiography, sound waves are used to evaluate how blood flows through your heart.    Stress test. There is more than one type of stress test. If a stress test is   needed, ask your caregiver about which type is best for you.    Chest X-ray exam.    Blood tests.    Computed tomography (CT).   TREATMENT    Treating any underlying conditions. For example, if you have an overactive thyroid, treating the condition may correct atrial fibrillation.    Medication. Medications may be given to control a rapid heart rate or to prevent blood clots, heart failure, or a stroke.    Procedure to correct the rhythm of the  heart:   Electrical cardioversion. During electrical cardioversion, a controlled, low-energy shock is delivered to the heart through your skin. If you have chest pain, very low pressure blood pressure, or sudden heart failure, this procedure may need to be done as an emergency.   Catheter ablation. During this procedure, heart tissues that send the signals that cause atrial fibrillation are destroyed.   Maze or minimaze procedure. During this surgery, thin lines of heart tissue that carry the abnormal signals are destroyed. The maze procedure is an open-heart surgery. The minimaze procedure is a minimally invasive surgery. This means that small cuts are made to access the heart instead of a large opening.   Pulmonary venous isolation. During this surgery, tissue around the veins that carry blood from the lungs (pulmonary veins) is destroyed. This tissue is thought to carry the abnormal signals.  HOME CARE INSTRUCTIONS    Take medications as directed by your caregiver.   Only take medications that your caregiver approves. Some medications can make atrial fibrillation worse or recur.   If blood thinners were prescribed by your caregiver, take them exactly as directed. Too much can cause bleeding. Too little and you will not have the needed protection against stroke and other problems.   Perform blood tests at home if directed by your caregiver.   Perform blood tests exactly as directed.    Quit smoking if you smoke.    Do not drink alcohol.    Do not drink caffeinated beverages such as coffee, soda, and some teas. You may drink decaffeinated coffee, soda, or tea.    Maintain a healthy weight. Do not use diet pills unless your caregiver approves. They may make heart problems worse.    Follow diet instructions as directed by your caregiver.    Exercise regularly as directed by your caregiver.    Keep all follow-up appointments.  PREVENTION   The following substances can cause atrial fibrillation  to recur:    Caffeinated beverages.    Alcohol.    Certain medications, especially those used for breathing problems.    Certain herbs and herbal medications, such as those containing ephedra or ginseng.   Illegal drugs such as cocaine and amphetamines.  Sometimes medications are given to prevent atrial fibrillation from recurring. Proper treatment of any underlying condition is also important in helping prevent recurrence.   SEEK MEDICAL CARE IF:   You notice a change in the rate, rhythm, or strength of your heartbeat.    You suddenly begin urinating more frequently.    You tire more easily when exerting yourself or exercising.   SEEK IMMEDIATE MEDICAL CARE IF:    You develop chest pain, abdominal pain, sweating, or weakness.   You feel sick to your stomach (nauseous).   You develop shortness of breath.   You suddenly develop swollen feet and ankles.   You feel dizzy.   You face or limbs feel numb or weak.   There is a change in your   vision or speech.  MAKE SURE YOU:    Understand these instructions.   Will watch your condition.   Will get help right away if you are not doing well or get worse.  Document Released: 07/01/2005 Document Revised: 10/26/2012 Document Reviewed: 08/11/2012  ExitCare Patient Information 2014 [employer].

## 2013-11-07 NOTE — ED Provider Notes (Signed)
Patient has converted to normal sinus rhythm rate approximately 95. Will discharge with cardiology followup   Donnetta HutchingBrian Jihan Rudy, MD 11/07/13 [phone]

## 2013-11-16 ENCOUNTER — Encounter: Payer: Managed Care, Other (non HMO) | Admitting: Cardiovascular Disease

## 2013-11-16 ENCOUNTER — Encounter (INDEPENDENT_AMBULATORY_CARE_PROVIDER_SITE_OTHER): Payer: Self-pay

## 2013-11-16 ENCOUNTER — Ambulatory Visit (INDEPENDENT_AMBULATORY_CARE_PROVIDER_SITE_OTHER): Payer: BC Managed Care – PPO | Admitting: Cardiovascular Disease

## 2013-11-16 ENCOUNTER — Encounter: Payer: Self-pay | Admitting: Cardiovascular Disease

## 2013-11-16 VITALS — BP 120/71 | HR 81 | Ht 68.0 in | Wt 287.0 lb

## 2013-11-16 DIAGNOSIS — I480 Paroxysmal atrial fibrillation: Secondary | ICD-10-CM

## 2013-11-16 DIAGNOSIS — I4891 Unspecified atrial fibrillation: Secondary | ICD-10-CM

## 2013-11-16 MED ORDER — DILTIAZEM HCL 30 MG PO TABS: ORAL_TABLET | ORAL | Status: DC

## 2013-11-16 NOTE — Patient Instructions (Addendum)
Your physician wants you to follow-up in: 6 months You will receive a reminder letter in the mail two months in advance. If you don't receive a letter, please call our office to schedule the follow-up appointment.    Your physician has recommended you make the following change in your medication:    START Diltiazem 30 mg daily,  take as needed for palpitations    Thank you for choosing [facility] !

## 2013-11-16 NOTE — Progress Notes (Signed)
Patient ID: Deborah Riddle, female   DOB: 03/27/1986, 28 y.o.   MRN: [id number]      SUBJECTIVE: The patient is a 28 year old woman with a past medical history significant for paroxysmal atrial fibrillation, polycystic ovarian disease, and morbid obesity. She saw Dr. Johney FrameAllred (EP) in 2013. As per review of the medical records, she had been on flecainide in the past but thought she had wheezing caused by this and stopped it on her own. She was then started on verapamil. She was offered both options of medical management versus EP study with possible radiofrequency ablation, and she adopted to pursue the former. She then stop taking verapamil on her own. She presented to the ED this past March with complaints of chest pain but no abnormalities were found. Specifically, basic metabolic panel, CBC, and troponins were all unremarkable. Chest x-ray was normal. ECG showed normal sinus rhythm with a nonspecific T wave abnormality. She then presented to the ED in late April with complaints of palpitations, and was found to be in rapid atrial fibrillation, HR 128 bpm, with ECG demonstrating a nonspecific T wave abnormality. She was given metoprolol for rate control and given a single dose of flecainide, which converted her to normal sinus rhythm.  The only thing she had done differently that day was take an over-the-counter medication for allergies, which have been significantly bothering her lately. She also tells me that she and her husband are trying to get pregnant and is taking both Provera and Clomid.   Allergies  Allergen Reactions  . Heparin Other (See Comments)    Lowers blood pressure     Current Outpatient Prescriptions  Medication Sig Dispense Refill  . medroxyPROGESTERone (PROVERA) 5 MG tablet Take 5 mg by mouth daily.      . metFORMIN (GLUCOPHAGE) 500 MG tablet Take 500 mg by mouth 2 (two) times daily with a meal.      . [DISCONTINUED] metoprolol tartrate (LOPRESSOR) 25 MG tablet Take 25  mg by mouth daily as needed. For racing heart       No current facility-administered medications for this visit.    Past Medical History  Diagnosis Date  . Chest pain, unspecified   . Morbid obesity   . Tobacco use disorder   . Body mass index 40.0-44.9, adult   . Paroxysmal atrial fibrillation   . Polycystic disease, ovaries     No past surgical history on file.  History   Social History  . Marital Status: Married    Spouse Name: CHRISTOPHER     Number of Children: N/A  . Years of Education: N/A   Occupational History  . [employer]   Social History Main Topics  . Smoking status: Former Smoker -- 1.00 packs/day for 5 years    Types: Cigarettes    Quit date: 09/10/2011  . Smokeless tobacco: Never Used  . Alcohol Use: Yes     Comment: Drinks alcohol on social occasions   . Drug Use: No  . Sexual Activity: Yes   Other Topics Concern  . Not on file   Social History Narrative   [place].  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     Filed Vitals:   11/16/13 0922  BP: 120/71  Pulse: 81  Height: 5\' 8"  (1.727 m)  Weight: 287 lb (130.182 kg)  SpO2: 100%    PHYSICAL EXAM General: NAD, obese Neck: No JVD, no thyromegaly. Lungs: Clear to auscultation bilaterally with normal  respiratory effort. CV: Nondisplaced PMI.  Regular rate and rhythm, normal S1/S2, no S3/S4, no murmur. No pretibial or periankle edema.  No carotid bruit.  Normal pedal pulses.  Abdomen: Soft, nontender, no hepatosplenomegaly, no distention.  Neurologic: Alert and oriented x 3.  Psych: Normal affect. Extremities: No clubbing or cyanosis.   ECG: reviewed and available in electronic records.      ASSESSMENT AND PLAN: 1. Paroxysmal atrial fibrillation: I offered her both options of EP study with possible radiofrequency ablation versus medical management, and she would prefer medical management. She does not want to take a medication on a daily  basis. For the time being, I will prescribe diltiazem 30 mg to be taken as needed for palpitations. She is going to see a physician to get evaluated and treated for her seasonal allergies. Diltiazem has a pregnancy class of C, which have informed the patient of.  Dispo: f/u 6 months.  Prentice DockerSuresh Eon Zunker, M.D., F.A.C.C.

## 2013-12-31 ENCOUNTER — Telehealth: Payer: Self-pay

## 2013-12-31 NOTE — Telephone Encounter (Signed)
Patient called today to say she sensation of pulling in both breasts and chest for 2 hrs.Denies CP,SOB,Nausea,she states her HR is regular.She wasn't sure if she had strained a muscle.She had not tried NSAID but was going to.I suggested if she felt worse or concerned she could go to her pcp or an ER.pt agreed with disposition

## 2014-02-17 ENCOUNTER — Encounter (HOSPITAL_COMMUNITY): Payer: Self-pay | Admitting: Emergency Medicine

## 2014-02-17 ENCOUNTER — Emergency Department (HOSPITAL_COMMUNITY): Payer: BC Managed Care – PPO

## 2014-02-17 ENCOUNTER — Emergency Department (HOSPITAL_COMMUNITY)
Admission: EM | Admit: 2014-02-17 | Discharge: 2014-02-17 | Disposition: A | Discharge status: Home / Self Care | Payer: BC Managed Care – PPO | Attending: Emergency Medicine | Admitting: Emergency Medicine

## 2014-02-17 DIAGNOSIS — Z3202 Encounter for pregnancy test, result negative: Secondary | ICD-10-CM | POA: Insufficient documentation

## 2014-02-17 DIAGNOSIS — I480 Paroxysmal atrial fibrillation: Secondary | ICD-10-CM

## 2014-02-17 DIAGNOSIS — I4891 Unspecified atrial fibrillation: Secondary | ICD-10-CM | POA: Insufficient documentation

## 2014-02-17 DIAGNOSIS — Z8639 Personal history of other endocrine, nutritional and metabolic disease: Secondary | ICD-10-CM | POA: Insufficient documentation

## 2014-02-17 DIAGNOSIS — Z87891 Personal history of nicotine dependence: Secondary | ICD-10-CM | POA: Insufficient documentation

## 2014-02-17 DIAGNOSIS — Z862 Personal history of diseases of the blood and blood-forming organs and certain disorders involving the immune mechanism: Secondary | ICD-10-CM | POA: Insufficient documentation

## 2014-02-17 DIAGNOSIS — Z79899 Other long term (current) drug therapy: Secondary | ICD-10-CM | POA: Insufficient documentation

## 2014-02-17 LAB — URINALYSIS, ROUTINE W REFLEX MICROSCOPIC
Bilirubin Urine: NEGATIVE
GLUCOSE, UA: NEGATIVE mg/dL
Ketones, ur: NEGATIVE mg/dL
LEUKOCYTES UA: NEGATIVE
Nitrite: NEGATIVE
PROTEIN: NEGATIVE mg/dL
Specific Gravity, Urine: 1.02 (ref 1.005–1.030)
Urobilinogen, UA: 0.2 mg/dL (ref 0.0–1.0)
pH: 6 (ref 5.0–8.0)

## 2014-02-17 LAB — BASIC METABOLIC PANEL
Anion gap: 15 (ref 5–15)
BUN: 11 mg/dL (ref 6–23)
CHLORIDE: 104 meq/L (ref 96–112)
CO2: 22 meq/L (ref 19–32)
Calcium: 9 mg/dL (ref 8.4–10.5)
Creatinine, Ser: 0.93 mg/dL (ref 0.50–1.10)
GFR calc non Af Amer: 82 mL/min — ABNORMAL LOW (ref >90)
Glucose, Bld: 95 mg/dL (ref 70–99)
POTASSIUM: 3.9 meq/L (ref 3.7–5.3)
Sodium: 141 mEq/L (ref 137–147)

## 2014-02-17 LAB — TROPONIN I: Troponin I: <0.3 ng/mL (ref <0.30)

## 2014-02-17 LAB — URINE MICROSCOPIC-ADD ON

## 2014-02-17 LAB — PREGNANCY, URINE: PREG TEST UR: NEGATIVE

## 2014-02-17 MED ORDER — FLECAINIDE ACETATE 50 MG PO TABS
150.0000 mg | ORAL_TABLET | Freq: Once | ORAL | Status: AC
  Administered 2014-02-17: 150 mg via ORAL
  Filled 2014-02-17: qty 1

## 2014-02-17 MED ORDER — FLECAINIDE ACETATE 100 MG PO TABS
ORAL_TABLET | ORAL | Status: AC
  Filled 2014-02-17: qty 2

## 2014-02-17 NOTE — ED Provider Notes (Signed)
CSN: [id number]     Arrival date & time 02/17/14  0305 History   First MD Initiated Contact with Patient 02/17/14 0422     Chief Complaint  Patient presents with  . Atrial Fibrillation     (Consider location/radiation/quality/duration/timing/severity/associated sxs/prior Treatment) HPI Sudden palpitations just prior to arrival feels like atrial fibrillation again and does not have her flecainide tablet to try taking that. No CP, SOB, dizzy, or focal neuro Sxs.  CV: irreg irreg  Past Medical History  Diagnosis Date  . Chest pain, unspecified   . Morbid obesity   . Tobacco use disorder   . Body mass index 40.0-44.9, adult   . Paroxysmal atrial fibrillation   . Polycystic disease, ovaries    History reviewed. No pertinent past surgical history. Family History  Problem Relation Age of Onset  . Arrhythmia Mother 40    History of Cardiac Stents  . Diabetes      Family History  . Hypertension      Family History   History  Substance Use Topics  . Smoking status: Former Smoker -- 1.00 packs/day for 5 years    Types: Cigarettes    Quit date: 09/10/2011  . Smokeless tobacco: Never Used  . Alcohol Use: Yes     Comment: Drinks alcohol on social occasions    OB History   Grav Para Term Preterm Abortions TAB SAB Ect Mult Living                 Review of Systems  10 Systems reviewed and are negative for acute change except as noted in the HPI.  Allergies  Heparin  Home Medications   Prior to Admission medications   Medication Sig Start Date End Date Taking? Authorizing Provider  diltiazem (CARDIZEM) 30 MG tablet Take 1 tablet daily as needed for palpitations 11/16/13   Laqueta Linden, MD  medroxyPROGESTERone (PROVERA) 5 MG tablet Take 5 mg by mouth daily.    Historical Provider, MD  metFORMIN (GLUCOPHAGE) 500 MG tablet Take 500 mg by mouth 2 (two) times daily with a meal.    Historical Provider, MD   BP 126/81  Pulse 90  Temp(Src) 98.5 F (36.9 C) (Oral)  Resp 11   Ht 5\' 8"  (1.727 m)  Wt 280 lb (127.007 kg)  BMI 42.58 kg/m2  SpO2 96% Physical Exam  Nursing note and vitals reviewed. Constitutional:  Awake, alert, nontoxic appearance.  HENT:  Head: Atraumatic.  Eyes: Right eye exhibits no discharge. Left eye exhibits no discharge.  Neck: Neck supple.  Cardiovascular:  No murmur heard. irreg irreg rate and rhythm <100bpm  Pulmonary/Chest: Effort normal and breath sounds normal. No respiratory distress. She has no wheezes. She has no rales. She exhibits no tenderness.  Abdominal: Soft. There is no tenderness. There is no rebound.  Musculoskeletal: She exhibits no edema and no tenderness.  Baseline ROM, no obvious new focal weakness.  Neurological: She is alert.  Mental status and motor strength appears baseline for patient and situation.  Skin: No rash noted.  Psychiatric: She has a normal mood and affect.    ED Course  Procedures (including critical care time) Labs Review Labs Reviewed  BASIC METABOLIC PANEL - Abnormal; Notable for the following:    GFR calc non Af Amer 82 (*)    All other components within normal limits  URINALYSIS, ROUTINE W REFLEX MICROSCOPIC - Abnormal; Notable for the following:    Hgb urine dipstick TRACE (*)    All other components  within normal limits  URINE MICROSCOPIC-ADD ON - Abnormal; Notable for the following:    Squamous Epithelial / LPF FEW (*)    Bacteria, UA FEW (*)    All other components within normal limits  TROPONIN I  PREGNANCY, URINE    Imaging Review No results found.   EKG Interpretation   Date/Time:  Thursday February 17 2014 03:20:08 EDT Ventricular Rate:  87 PR Interval:    QRS Duration: 92 QT Interval:  340 QTC Calculation: 409 R Axis:   81 Text Interpretation:  Atrial fibrillation Borderline repolarization  abnormality Compared to previous tracing Rate slower Confirmed by [facility]   MD, Jonny RuizJOHN ([id number]) on 02/17/2014 7:30:54 AM      MDM   Final diagnoses:  Paroxysmal atrial  fibrillation    Patient informed of clinical course, understand medical decision-making process, and agree with plan. Cards notes reviewed rec flecainide prn PAF (ordered).  I doubt any other EMC precluding discharge at this time including, but not necessarily limited to the following:rapid Afib, CVA, ACS.   Hurman HornJohn M Rosland Riding, MD 02/19/14 [phone]

## 2014-02-17 NOTE — Discharge Instructions (Signed)
Atrial Fibrillation  Atrial fibrillation is a condition that causes your heart to beat irregularly. It may also cause your heart to beat faster than normal. Atrial fibrillation can prevent your heart from pumping blood normally. It increases your risk of stroke and heart problems.  HOME CARE  · Take medications as told by your doctor.  · Only take medications that your doctor says are safe. Some medications can make the condition worse or happen again.  · If blood thinners were prescribed by your doctor, take them exactly as told. Too much can cause bleeding. Too little and you will not have the needed protection against stroke and other problems.  · Perform blood tests at home if told by your doctor.  · Perform blood tests exactly as told by your doctor.  · Do not drink alcohol.  · Do not drink beverages with caffeine such as coffee, soda, and some teas.  · Maintain a healthy weight.  · Do not use diet pills unless your doctor says they are safe. They may make heart problems worse.  · Follow diet instructions as told by your doctor.  · Exercise regularly as told by your doctor.  · Keep all follow-up appointments.  GET HELP IF:  · You notice a change in the speed, rhythm, or strength of your heartbeat.  · You suddenly begin peeing (urinating) more often.  · You get tired more easily when moving or exercising.  GET HELP RIGHT AWAY IF:   · You have chest or belly (abdominal) pain.  · You feel sick to your stomach (nauseous).  · You are short of breath.  · You suddenly have swollen feet and ankles.  · You feel dizzy.  · You face, arms, or legs feel numb or weak.  · There is a change in your vision or speech.  MAKE SURE YOU:   · Understand these instructions.  · Will watch your condition.  · Will get help right away if you are not doing well or get worse.  Document Released: 04/09/2008 Document Revised: 11/15/2013 Document Reviewed: 08/11/2012  ExitCare® Patient Information ©2015 [employer]. This information is not  intended to replace advice given to you by your health care provider. Make sure you discuss any questions you have with your health care provider.

## 2014-03-01 ENCOUNTER — Telehealth: Payer: Self-pay | Admitting: *Deleted

## 2014-03-01 ENCOUNTER — Other Ambulatory Visit: Payer: Self-pay

## 2014-03-01 NOTE — Telephone Encounter (Signed)
Please have her evaluated by Dr. Johney FrameAllred.

## 2014-03-01 NOTE — Telephone Encounter (Signed)
I see your note and pt is not on flecainide.She wants misplaced drug refilled.Ineed to check with you first.    Forward to Dr.Koneswaran

## 2014-03-01 NOTE — Telephone Encounter (Signed)
Pt thinks she has miss placed her flecainide. Can we call her in a new RX to [facility]

## 2014-03-01 NOTE — Telephone Encounter (Signed)
I will forward to Dr.Allred

## 2014-03-03 ENCOUNTER — Telehealth: Payer: Self-pay | Admitting: *Deleted

## 2014-03-03 MED ORDER — DILTIAZEM HCL 30 MG PO TABS: ORAL_TABLET | ORAL | Status: DC

## 2014-03-03 NOTE — Telephone Encounter (Signed)
[facility] [place] [facility] was not pt's listed drug store  [facility]em 30 mg daily prn for palpitations

## 2014-03-03 NOTE — Telephone Encounter (Signed)
Dr.Allred,please disregard,pt had drug mistaken for cardizem

## 2014-03-03 NOTE — Telephone Encounter (Signed)
PT called the other day asking for flecainide to be called in but she ment for it be diltiazem and needs that [facility] [place]

## 2014-05-18 ENCOUNTER — Encounter (HOSPITAL_COMMUNITY): Payer: Self-pay | Admitting: Emergency Medicine

## 2014-05-18 ENCOUNTER — Emergency Department (HOSPITAL_COMMUNITY)
Admission: EM | Admit: 2014-05-18 | Discharge: 2014-05-19 | Disposition: A | Discharge status: Home / Self Care | Payer: BC Managed Care – PPO | Attending: Emergency Medicine | Admitting: Emergency Medicine

## 2014-05-18 DIAGNOSIS — Z87891 Personal history of nicotine dependence: Secondary | ICD-10-CM | POA: Diagnosis not present

## 2014-05-18 DIAGNOSIS — Z79899 Other long term (current) drug therapy: Secondary | ICD-10-CM | POA: Diagnosis not present

## 2014-05-18 DIAGNOSIS — K5900 Constipation, unspecified: Secondary | ICD-10-CM | POA: Diagnosis present

## 2014-05-18 DIAGNOSIS — Z3202 Encounter for pregnancy test, result negative: Secondary | ICD-10-CM | POA: Insufficient documentation

## 2014-05-18 DIAGNOSIS — Z8679 Personal history of other diseases of the circulatory system: Secondary | ICD-10-CM | POA: Diagnosis not present

## 2014-05-18 DIAGNOSIS — R52 Pain, unspecified: Secondary | ICD-10-CM

## 2014-05-18 NOTE — ED Notes (Signed)
Patient c/o feeling like she has to have a BM, but cannot go.  Patient c/o sharp abdominal cramping.

## 2014-05-18 NOTE — ED Provider Notes (Signed)
CSN: [id number]     Arrival date & time 05/18/14  2335 History   None    No chief complaint on file.    (Consider location/radiation/quality/duration/timing/severity/associated sxs/prior Treatment) Patient is a 28 y.o. female presenting with constipation. The history is provided by the patient. No language interpreter was used.  Constipation Severity:  Moderate Time since last bowel movement:  1 day Timing:  Constant Progression:  Worsening Chronicity:  New Context: not medication   Stool description:  Hard Relieved by:  Nothing Worsened by:  Nothing tried Ineffective treatments:  None tried Associated symptoms: no abdominal pain   Risk factors: no recent illness   Pt complains of constipation.  Past Medical History  Diagnosis Date  . Chest pain, unspecified   . Morbid obesity   . Tobacco use disorder   . Body mass index 40.0-44.9, adult   . Paroxysmal atrial fibrillation   . Polycystic disease, ovaries    No past surgical history on file. Family History  Problem Relation Age of Onset  . Arrhythmia Mother 5850    History of Cardiac Stents  . Diabetes      Family History  . Hypertension      Family History   History  Substance Use Topics  . Smoking status: Former Smoker -- 1.00 packs/day for 5 years    Types: Cigarettes    Quit date: 09/10/2011  . Smokeless tobacco: Never Used  . Alcohol Use: Yes     Comment: Drinks alcohol on social occasions    OB History    No data available     Review of Systems  Gastrointestinal: Positive for constipation. Negative for abdominal pain.  All other systems reviewed and are negative.     Allergies  Heparin  Home Medications   Prior to Admission medications   Medication Sig Start Date End Date Taking? Authorizing Provider  diltiazem (CARDIZEM) 30 MG tablet Take 1 tablet daily as needed for palpitations 03/03/14   Laqueta LindenSuresh A Koneswaran, MD  medroxyPROGESTERone (PROVERA) 5 MG tablet Take 5 mg by mouth daily.     Historical Provider, MD  metFORMIN (GLUCOPHAGE) 500 MG tablet Take 500 mg by mouth 2 (two) times daily with a meal.    Historical Provider, MD   There were no vitals taken for this visit. Physical Exam  Constitutional: She is oriented to person, place, and time. She appears well-developed and well-nourished.  HENT:  Head: Normocephalic.  Eyes: Pupils are equal, round, and reactive to light.  Neck: Normal range of motion.  Cardiovascular: Normal rate and regular rhythm.   Pulmonary/Chest: Effort normal.  Abdominal: Soft. There is no tenderness. There is no rebound.  Musculoskeletal: Normal range of motion.  Neurological: She is alert and oriented to person, place, and time. She has normal reflexes.  Skin: Skin is warm.  Psychiatric: She has a normal mood and affect.  Nursing note and vitals reviewed.   ED Course  Procedures (including critical care time) Labs Review Labs Reviewed - No data to display  Imaging Review Dg Abd 1 View  05/19/2014   CLINICAL DATA:  Lower abdominal back pain for 1 day. Constipation sensation.  EXAM: ABDOMEN - 1 VIEW  COMPARISON:  None.  FINDINGS: Bowel gas pattern is nondilated and nonobstructive. Moderate amount of retained large bowel stool. No intra-abdominal mass effect, pathologic calcifications or free air. Soft tissue planes and included osseous structures are nonsuspicious. Phlebolith in LEFT pelvis.  IMPRESSION: Moderate amount of retained large bowel stool, non obstructive bowel  gas pattern.   Electronically Signed   By: Awilda Metroourtnay  Bloomer   On: 05/19/2014 00:41     EKG Interpretation None      MDM   Final diagnoses:  Pain  Constipation, unspecified constipation type    Miralax     Elson AreasLeslie K Sofia, PA-C 05/19/14 [id number]  Dione Boozeavid Glick, MD 05/19/14 [phone]

## 2014-05-19 ENCOUNTER — Emergency Department (HOSPITAL_COMMUNITY): Payer: BC Managed Care – PPO

## 2014-05-19 LAB — URINALYSIS, ROUTINE W REFLEX MICROSCOPIC
Bilirubin Urine: NEGATIVE
Glucose, UA: NEGATIVE mg/dL
Ketones, ur: NEGATIVE mg/dL
Leukocytes, UA: NEGATIVE
Nitrite: NEGATIVE
Protein, ur: NEGATIVE mg/dL
Urobilinogen, UA: 0.2 mg/dL (ref 0.0–1.0)
pH: 6 (ref 5.0–8.0)

## 2014-05-19 LAB — URINE MICROSCOPIC-ADD ON

## 2014-05-19 LAB — PREGNANCY, URINE: Preg Test, Ur: NEGATIVE

## 2014-05-19 MED ORDER — POLYETHYLENE GLYCOL 3350 17 G PO PACK: 17.0000 g | PACK | Freq: Every day | ORAL | Status: DC

## 2014-05-19 MED ORDER — IBUPROFEN 800 MG PO TABS
800.0000 mg | ORAL_TABLET | Freq: Once | ORAL | Status: AC
  Administered 2014-05-19: 800 mg via ORAL
  Filled 2014-05-19: qty 1

## 2014-05-19 NOTE — Discharge Instructions (Signed)

## 2014-08-17 ENCOUNTER — Ambulatory Visit: Payer: 59 | Admitting: *Deleted

## 2014-08-17 ENCOUNTER — Telehealth: Payer: Self-pay | Admitting: Cardiovascular Disease

## 2014-08-17 ENCOUNTER — Telehealth: Payer: Self-pay | Admitting: *Deleted

## 2014-08-17 NOTE — Telephone Encounter (Signed)
Yes, ok to take another diltiazem 30 mg tablet. Would have her come in for ECG to see if HR is elevated. If so, she would need an office visit. I can see her tomorrow afternoon.

## 2014-08-17 NOTE — Telephone Encounter (Signed)
Call patient and gave provider notes. Patient voiced understanding. Patient states she will come in today between 11 am and 12 noon. All questions answered.

## 2014-08-17 NOTE — Telephone Encounter (Signed)
-----   Message from Kerney ElbeLukisha G Pinnix, LPN sent at 1/6/10962/09/2014  8:03 AM EST ----- Patient called and stated that she has been in A Fib since 2 am this morning . At that time she took 30 mg of Diltiazem. She states she is still in A Fib and would like to know if she can take another Diltiazem. She has an appt to see you 2/22. Denies SOB and CP at this time. Please advise.

## 2014-08-17 NOTE — Telephone Encounter (Signed)
Patient called and states she has been in A Fib since 2 am this morning. Denies SOB and CP. Would like to know if she may take another Diltiazem.

## 2014-09-05 ENCOUNTER — Ambulatory Visit: Payer: Self-pay | Admitting: Cardiovascular Disease

## 2014-09-09 ENCOUNTER — Encounter: Payer: Self-pay | Admitting: Cardiovascular Disease

## 2014-09-09 ENCOUNTER — Ambulatory Visit (INDEPENDENT_AMBULATORY_CARE_PROVIDER_SITE_OTHER): Payer: 59 | Admitting: Cardiovascular Disease

## 2014-09-09 VITALS — BP 114/78 | HR 84 | Ht 68.0 in | Wt 280.0 lb

## 2014-09-09 DIAGNOSIS — I480 Paroxysmal atrial fibrillation: Secondary | ICD-10-CM

## 2014-09-09 NOTE — Progress Notes (Signed)
Patient ID: Deborah Riddle, female   DOB: 02/04/1986, 29 y.o.   MRN: [id number]      SUBJECTIVE: The patient is a 29 year old woman with a past medical history significant for paroxysmal atrial fibrillation, polycystic ovarian disease, and morbid obesity. She saw Dr. Johney FrameAllred (EP) in 2013. As per prior review of the medical records, she had been on flecainide in the past but thought she had wheezing caused by this and stopped it on her own. She was then started on verapamil. She was offered both options of medical management versus EP study with possible radiofrequency ablation, and she adopted to pursue the former. She then stop taking verapamil on her own. She presented to the ED in 09/2013 with complaints of chest pain but no abnormalities were found. Specifically, basic metabolic panel, CBC, and troponins were all unremarkable. Chest x-ray was normal. ECG showed normal sinus rhythm with a nonspecific T wave abnormality. She then presented to the ED in late April 2015 with complaints of palpitations, and was found to be in rapid atrial fibrillation, HR 128 bpm, with ECG demonstrating a nonspecific T wave abnormality. She was given metoprolol for rate control and given a single dose of flecainide, which converted her to normal sinus rhythm.  She called earlier this month complaining of being in atrial fibrillation and I instructed her to take diltiazem and to come in to have an ECG performed and I would see her that week. She was a no show for that appointment but she did have the ECG performed which demonstrated sinus rhythm, HR 95 bpm, with a diffuse nonspecific ST-T abnormality.  She said she had a sinus infection at that time and that may have triggered her atrial fibrillation. She avoids caffeine and chocolate. She is still trying to conceive. She said when she gets adequate sleep this helps suppress paroxysms of atrial fibrillation. She said she has been under a lot of stress lately.   Review of  Systems: As per "subjective", otherwise negative.  Allergies  Allergen Reactions  . Heparin Other (See Comments)    Lowers blood pressure     Current Outpatient Prescriptions  Medication Sig Dispense Refill  . diltiazem (CARDIZEM) 30 MG tablet Take 1 tablet daily as needed for palpitations 30 tablet 3  . metFORMIN (GLUCOPHAGE) 500 MG tablet Take 500 mg by mouth 2 (two) times daily with a meal.    . Vitamin D, Ergocalciferol, (DRISDOL) 50000 UNITS CAPS capsule Take 1 capsule by mouth once a week.    . [DISCONTINUED] metoprolol tartrate (LOPRESSOR) 25 MG tablet Take 25 mg by mouth daily as needed. For racing heart     No current facility-administered medications for this visit.    Past Medical History  Diagnosis Date  . Chest pain, unspecified   . Morbid obesity   . Tobacco use disorder   . Body mass index 40.0-44.9, adult   . Paroxysmal atrial fibrillation   . Polycystic disease, ovaries     No past surgical history on file.  History   Social History  . Marital Status: Married    Spouse Name: CHRISTOPHER   . Number of Children: N/A  . Years of Education: N/A   Occupational History  . [employer]   Social History Main Topics  . Smoking status: Former Smoker -- 1.00 packs/day for 12 years    Types: Cigarettes    Start date: 01/21/2004    Quit date: 09/10/2011  . Smokeless tobacco: Never Used  . Alcohol  Use: 0.0 oz/week    0 Standard drinks or equivalent per week     Comment: Drinks alcohol on social occasions   . Drug Use: No  . Sexual Activity: Yes   Other Topics Concern  . Not on file   Social History Narrative   Lives in [place] with husband.  Works as a Water quality scientist at [employer].     Filed Vitals:   09/09/14 0950  BP: 114/78  Pulse: 84  Height:  (1.727 m)  Weight: 280 lb (127.007 kg)  SpO2: 97%    PHYSICAL EXAM General: NAD, obese Neck: No JVD, no thyromegaly. Lungs: Clear to auscultation bilaterally with  normal respiratory effort. CV: Nondisplaced PMI. Regular rate and rhythm, normal S1/S2, no S3/S4, no murmur. No pretibial or periankle edema. Abdomen: Soft, nontender, obese, no distention.  Neurologic: Alert and oriented x 3.  Psych: Normal affect. Extremities: No clubbing or cyanosis.  Skin: Normal. Musculoskeletal: Normal range of motion, no gross deformities.   ECG: Most recent ECG reviewed.      ASSESSMENT AND PLAN: 1. Paroxysmal atrial fibrillation: I offered her both options of EP study with possible radiofrequency ablation versus medical management, and went into great length explaining the possible positive outcomes given that she is young and does not currently have a host of other comorbidities. She appears to be willing to meet with Dr. Johney Frame, so I will make a referraal. Will continue diltiazem 30 mg to be taken as needed for palpitations.  Dispo: f/u with me in 1 year. See Dr. Johney Frame sooner.  Prentice Docker, M.D., F.A.C.C.

## 2014-09-09 NOTE — Patient Instructions (Signed)
   Referral to Dr. Johney FrameAllred  Continue all current medications. Your physician wants you to follow up in:  1 year.  You will receive a reminder letter in the mail one-two months in advance.  If you don't receive a letter, please call our office to schedule the follow up appointment

## 2014-10-10 ENCOUNTER — Institutional Professional Consult (permissible substitution): Payer: 59 | Admitting: Internal Medicine

## 2014-11-02 ENCOUNTER — Institutional Professional Consult (permissible substitution): Payer: 59 | Admitting: Internal Medicine

## 2014-11-23 ENCOUNTER — Institutional Professional Consult (permissible substitution): Payer: 59 | Admitting: Internal Medicine

## 2014-12-21 ENCOUNTER — Encounter: Payer: Self-pay | Admitting: Internal Medicine

## 2014-12-21 ENCOUNTER — Ambulatory Visit (INDEPENDENT_AMBULATORY_CARE_PROVIDER_SITE_OTHER): Payer: 59 | Admitting: Internal Medicine

## 2014-12-21 VITALS — BP 124/84 | HR 80 | Ht 68.0 in | Wt 293.2 lb

## 2014-12-21 DIAGNOSIS — I480 Paroxysmal atrial fibrillation: Secondary | ICD-10-CM | POA: Diagnosis not present

## 2014-12-21 DIAGNOSIS — E669 Obesity, unspecified: Secondary | ICD-10-CM

## 2014-12-21 NOTE — Progress Notes (Signed)
Electrophysiology Office Note   Date:  12/21/2014   ID:  Deborah Riddle, DOB 07/12/1986, MRN [id number]  PCP:  Ignatius SpeckingVYAS,DHRUV B., MD  Cardiologist:  Dr Purvis SheffieldKoneswaran Primary Electrophysiologist: Hillis RangeJames Roselee Tayloe, MD    Chief Complaint  Patient presents with  . paroxysmal atrial fibrillation     History of Present Illness: Deborah Riddle is a 29 y.o. female who presents today for electrophysiology evaluation.   She reports initially being diagnosed with atrial fiibrillation in 2008.  She tried flecainide but did not tolerate this due to wheezing.  She was on verapamil when I saw her in 3/13.  30 day monitor at that time did not show afib.  She was referred for [facility] which she says did not show sleep apnea (I do not have results). She did not comply with follow-up in my office and I have not seen her in over 3 years. She has recently re-established with Dr Purvis SheffieldKoneswaran.  She continues to have atrial fibrillation.  She reports that an episode will occur every 3 months or so.  [facility] when her afib did not resolve and was confirmed to have rate controlled afib.  Typically episodes only last 2-3 hours.  She sometimes wakes from sleep with afib.  She finds that decongestants, excessive caffeine, or chocolate will trigger her afib.  During her afib, she has symptoms of palpitations, dizziness, mild SOB, and fatigue.  Today, she denies symptoms of exertional chest pain, shortness of breath, orthopnea, PND, lower extremity edema, claudication, presyncope, syncope, bleeding, or neurologic sequela. The patient is tolerating medications without difficulties and is otherwise without complaint today.    Past Medical History  Diagnosis Date  . Chest pain, unspecified   . Morbid obesity   . Tobacco use disorder   . Body mass index 40.0-44.9, adult   . Paroxysmal atrial fibrillation   . Polycystic disease, ovaries    Past Surgical History    Procedure Laterality Date  . None       Current Outpatient Prescriptions  Medication Sig Dispense Refill  . Chorionic Gonadotropin (PREGNYL IM) Inject 100,000 Units into the muscle every 30 (thirty) days.    Marland Kitchen. diltiazem (CARDIZEM) 30 MG tablet Take 1 tablet daily as needed for palpitations 30 tablet 3  . folic acid (FOLVITE) 800 MCG tablet Take 400 mcg by mouth daily.    Marland Kitchen. letrozole (FEMARA) 2.5 MG tablet Take 2.5 mg by mouth as directed.    . metFORMIN (GLUCOPHAGE) 500 MG tablet Take 500 mg by mouth 2 (two) times daily with a meal.    . Vitamin D, Ergocalciferol, (DRISDOL) 50000 UNITS CAPS capsule Take 1 capsule by mouth once a week.    . [DISCONTINUED] metoprolol tartrate (LOPRESSOR) 25 MG tablet Take 25 mg by mouth daily as needed. For racing heart     No current facility-administered medications for this visit.    Allergies:   Heparin   Social History:  The patient  reports that she quit smoking about 3 years ago. Her smoking use included Cigarettes. She started smoking about 10 years ago. She has a 12 pack-year smoking history. She has never used smokeless tobacco. She reports that she drinks alcohol. She reports that she does not use illicit drugs.   Family History:  The patient's  family history includes Arrhythmia (age of onset: 2450) in her mother; Diabetes in an other family member; Hypertension in an other family  member.    ROS:  Please see the history of present illness.   All other systems are reviewed and negative.    PHYSICAL EXAM: VS:  BP 124/84 mmHg  Pulse 80  Ht  (1.727 m)  Wt 132.995 kg (293 lb 3.2 oz)  BMI 44.59 kg/m2 , BMI Body mass index is 44.59 kg/(m^2). GEN: overweight,  in no acute distress HEENT: normal Neck: no JVD, carotid bruits, or masses Cardiac: RRR; no murmurs, rubs, or gallops,no edema  Respiratory:  clear to auscultation bilaterally, normal work of breathing GI: soft, nontender, nondistended, + BS MS: no deformity or atrophy Skin: warm  and dry  Neuro:  Strength and sensation are intact Psych: euthymic mood, full affect  EKG:  EKG is ordered today. The ekg ordered today shows sinus rhythm 80 bpm, PR 162, QRS 80, QTc 405   Recent Labs: 02/17/2014: BUN 11; Creatinine, Ser 0.93; Potassium 3.9; Sodium 141    Lipid Panel  No results found for: CHOL, TRIG, HDL, CHOLHDL, VLDL, LDLCALC, LDLDIRECT   Wt Readings from Last 3 Encounters:  12/21/14 132.995 kg (293 lb 3.2 oz)  09/09/14 127.007 kg (280 lb)  05/18/14 125.646 kg (277 lb)      Other studies Reviewed: Additional studies/ records that were reviewed today include: Dr Caesar Chestnut notes, [facility] notes from 8/15  Review of the above records today demonstrates: afib documented on ekg (reviewed)   ASSESSMENT AND PLAN:  1.  Paroxysmal atrial fibrillation The patient has symptomatic afib.  She has failed medical therapy with flecainide and metoprolol.  She takes cardizem prn and is doing reasonably well with this approach.  She does not feel that episodes are frequent enough to justify daily medication. She is morbidly obese and this likely contributes to her afib.  I have discussed Cardiofit study results with her today and have emphasized the importance of lifestyle modification in order to adequately manage her afib. Without lifestyle modification, I would not anticipate that an afib ablation would be successful.  2. Morbid obesity As above I have referred to the [facility] AF clinic nutrition call I will also refer to the Bariatric surgical clinic at [facility]   Current medicines are reviewed at length with the patient today.   The patient does not have concerns regarding her medicines.  The following changes were made today:  none   Follow-up with Dr Purvis Sheffield as scheduled I will see as needed going forward  Signed, Hillis Range, MD  12/21/2014 9:51 AM     [facility] [address] [place] [address] [phone]  (office) [phone] (fax)

## 2014-12-21 NOTE — Patient Instructions (Signed)
Medication Instructions:  Your physician recommends that you continue on your current medications as directed. Please refer to the Current Medication list given to you today.      Labwork: None ordered  Testing/Procedures: None ordered  Follow-Up: Your physician recommends that you schedule a follow-up appointment as needed with Dr Johney FrameAllred   Any Other Special Instructions Will Be Listed Below (If Applicable).  You have been referred to Dr Daphine DeutscherMartin

## 2016-04-09 ENCOUNTER — Telehealth: Payer: Self-pay | Admitting: Cardiovascular Disease

## 2016-04-09 NOTE — Telephone Encounter (Signed)
Numerous attempts to contact patient with recall letters with no success.   Time: Status:    Deborah Riddle [[id number]][[id number]] 09/09/2014 10:27 AM New [10]   [System] 05/16/2015 11:01 PM Notification Sent [20]   Deborah Riddle [[id number]][[id number]] 10/04/2015 10:35 AM Notification Sent [20]   Deborah Riddle [[id number]][[id number]] 10/06/2015 1:21 PM Notification Sent [20]   Deborah Riddle [[id number]][[id number]] 10/31/2015 11:49 AM Notification Sent [20]

## 2016-06-06 ENCOUNTER — Encounter (HOSPITAL_COMMUNITY): Payer: Self-pay

## 2016-06-06 ENCOUNTER — Emergency Department (HOSPITAL_COMMUNITY)
Admission: EM | Admit: 2016-06-06 | Discharge: 2016-06-06 | Disposition: A | Discharge status: Home / Self Care | Payer: BLUE CROSS/BLUE SHIELD | Attending: Emergency Medicine | Admitting: Emergency Medicine

## 2016-06-06 DIAGNOSIS — R600 Localized edema: Secondary | ICD-10-CM | POA: Insufficient documentation

## 2016-06-06 DIAGNOSIS — I480 Paroxysmal atrial fibrillation: Secondary | ICD-10-CM | POA: Insufficient documentation

## 2016-06-06 DIAGNOSIS — R000 Tachycardia, unspecified: Secondary | ICD-10-CM | POA: Diagnosis present

## 2016-06-06 DIAGNOSIS — Z7984 Long term (current) use of oral hypoglycemic drugs: Secondary | ICD-10-CM | POA: Diagnosis not present

## 2016-06-06 DIAGNOSIS — Z87891 Personal history of nicotine dependence: Secondary | ICD-10-CM | POA: Insufficient documentation

## 2016-06-06 DIAGNOSIS — Z79899 Other long term (current) drug therapy: Secondary | ICD-10-CM | POA: Insufficient documentation

## 2016-06-06 LAB — CBC WITH DIFFERENTIAL/PLATELET
BASOS ABS: 0 10*3/uL (ref 0.0–0.1)
BASOS PCT: 0 %
EOS ABS: 0.3 10*3/uL (ref 0.0–0.7)
EOS PCT: 3 %
HCT: 40.8 % (ref 36.0–46.0)
HEMOGLOBIN: 14.1 g/dL (ref 12.0–15.0)
Lymphocytes Relative: 44 %
Lymphs Abs: 4.2 10*3/uL — ABNORMAL HIGH (ref 0.7–4.0)
MCH: 24.5 pg — AB (ref 26.0–34.0)
MCHC: 34.6 g/dL (ref 30.0–36.0)
MCV: 70.8 fL — ABNORMAL LOW (ref 78.0–100.0)
MONO ABS: 0.5 10*3/uL (ref 0.1–1.0)
Monocytes Relative: 5 %
Neutro Abs: 4.7 10*3/uL (ref 1.7–7.7)
Neutrophils Relative %: 48 %
Platelets: 226 10*3/uL (ref 150–400)
RBC: 5.76 MIL/uL — AB (ref 3.87–5.11)
RDW: 17.2 % — AB (ref 11.5–15.5)
Smear Review: ADEQUATE
WBC: 9.8 10*3/uL (ref 4.0–10.5)

## 2016-06-06 LAB — BASIC METABOLIC PANEL
Anion gap: 7 (ref 5–15)
BUN: 11 mg/dL (ref 6–20)
CALCIUM: 9.4 mg/dL (ref 8.9–10.3)
CHLORIDE: 107 mmol/L (ref 101–111)
CO2: 25 mmol/L (ref 22–32)
CREATININE: 0.95 mg/dL (ref 0.44–1.00)
Glucose, Bld: 112 mg/dL — ABNORMAL HIGH (ref 65–99)
Potassium: 3.6 mmol/L (ref 3.5–5.1)
SODIUM: 139 mmol/L (ref 135–145)

## 2016-06-06 LAB — TROPONIN I

## 2016-06-06 LAB — POC URINE PREG, ED: PREG TEST UR: NEGATIVE

## 2016-06-06 MED ORDER — FLECAINIDE ACETATE 100 MG PO TABS
300.0000 mg | ORAL_TABLET | Freq: Once | ORAL | Status: AC
  Administered 2016-06-06: 300 mg via ORAL
  Filled 2016-06-06: qty 3

## 2016-06-06 MED ORDER — DILTIAZEM LOAD VIA INFUSION
20.0000 mg | Freq: Once | INTRAVENOUS | Status: AC
  Administered 2016-06-06: 20 mg via INTRAVENOUS
  Filled 2016-06-06: qty 20

## 2016-06-06 MED ORDER — ASPIRIN 81 MG PO CHEW
324.0000 mg | CHEWABLE_TABLET | Freq: Once | ORAL | Status: AC
  Administered 2016-06-06: 324 mg via ORAL
  Filled 2016-06-06: qty 4

## 2016-06-06 MED ORDER — FLECAINIDE ACETATE 150 MG PO TABS: ORAL_TABLET | ORAL | 0 refills | Status: DC

## 2016-06-06 MED ORDER — DILTIAZEM HCL 100 MG IV SOLR
5.0000 mg/h | INTRAVENOUS | Status: DC
  Administered 2016-06-06: 5 mg/h via INTRAVENOUS
  Filled 2016-06-06: qty 100

## 2016-06-06 MED ORDER — FLECAINIDE ACETATE 100 MG PO TABS
ORAL_TABLET | ORAL | Status: AC
  Filled 2016-06-06: qty 3

## 2016-06-06 NOTE — ED Triage Notes (Signed)
Pt states she felt she was in a.fib late last night, states she can usually get herself out of it, but it has persisted. Pt has mild chest discomfort.

## 2016-06-06 NOTE — Discharge Instructions (Signed)
If you go back into atrial fibrillation, take two flecainide tablets at the same time. If it does not get better in four hours, go immediately to the ED.  You need to talk with your cardiologist about whether you should get the ablation procedure, and about whether you should be on blood thinners. In the mean time, take one regular strength aspirin (325 mg) every day.

## 2016-06-06 NOTE — ED Provider Notes (Signed)
[facility]-EMERGENCY DEPT Provider Note   CSN: [id number] Arrival date & time: 06/06/16  0356     History   Chief Complaint Chief Complaint  Patient presents with  . Atrial Fibrillation    HPI Deborah Riddle is a 30 y.o. female.  30 year old female with a history of paroxysmal atrial fibrillation states that she noted rapid and irregular heartbeat at about 10 PM typical of her episodes of atrial fibrillation. She denies chest pain, heaviness, tightness, pressure. She denies dyspnea, nausea, vomiting, diaphoresis. In the past, she has taken flecainide, diltiazem, metoprolol but has not been taking any of these medications during this past year. She states this is the first episode she has had this year. She denies ethanol, tobacco, drug use.   The history is provided by the patient.  Atrial Fibrillation     Past Medical History:  Diagnosis Date  . Body mass index 40.0-44.9, adult (HCC)   . Chest pain, unspecified   . Morbid obesity (HCC)   . Paroxysmal atrial fibrillation (HCC)   . Polycystic disease, ovaries   . Tobacco use disorder     Patient Active Problem List   Diagnosis Date Noted  . Morbid obesity (HCC) 12/22/2014  . Chest pain 09/23/2011  . Paroxysmal atrial fibrillation (HCC)   . Palpitations 03/23/2009    Past Surgical History:  Procedure Laterality Date  . none      OB History    No data available       Home Medications    Prior to Admission medications   Medication Sig Start Date End Date Taking? Authorizing Provider  Chorionic Gonadotropin (PREGNYL IM) Inject 100,000 Units into the muscle every 30 (thirty) days.    Historical Provider, MD  diltiazem (CARDIZEM) 30 MG tablet Take 1 tablet daily as needed for palpitations 03/03/14   Laqueta LindenSuresh A Koneswaran, MD  folic acid (FOLVITE) 800 MCG tablet Take 400 mcg by mouth daily.    Historical Provider, MD  letrozole (FEMARA) 2.5 MG tablet Take 2.5 mg by mouth as directed.    Historical Provider, MD    metFORMIN (GLUCOPHAGE) 500 MG tablet Take 500 mg by mouth 2 (two) times daily with a meal.    Historical Provider, MD  Vitamin D, Ergocalciferol, (DRISDOL) 50000 UNITS CAPS capsule Take 1 capsule by mouth once a week. 04/22/14   Historical Provider, MD    Family History Family History  Problem Relation Age of Onset  . Arrhythmia Mother 6550    History of Cardiac Stents  . Diabetes      Family History  . Hypertension      Family History    Social History Social History  Substance Use Topics  . Smoking status: Former Smoker    Packs/day: 1.00    Years: 12.00    Types: Cigarettes    Start date: 01/21/2004    Quit date: 09/10/2011  . Smokeless tobacco: Never Used  . Alcohol use 0.0 oz/week     Comment: 3 glasses of wine per week     Allergies   Heparin   Review of Systems Review of Systems  All other systems reviewed and are negative.    Physical Exam Updated Vital Signs BP 141/96 (BP Location: Right Arm)   Pulse 95   Temp 97.7 F (36.5 C) (Oral)   Resp 15   Ht 5\' 7"  (1.702 m)   Wt 295 lb (133.8 kg)   SpO2 100%   BMI 46.20 kg/m   Physical Exam  Nursing  note and vitals reviewed.  Obese 30 year old female, resting comfortably and in no acute distress. Vital signs are significant for hypertension and tachycardia. Oxygen saturation is 100%, which is normal. Head is normocephalic and atraumatic. PERRLA, EOMI. Oropharynx is clear. Neck is nontender and supple without adenopathy or JVD. Back is nontender and there is no CVA tenderness. Lungs are clear without rales, wheezes, or rhonchi. Chest is nontender. Heart Is tachycardic and irregular without murmur. Abdomen is soft, flat, nontender without masses or hepatosplenomegaly and peristalsis is normoactive. Extremities have 1+ edema, full range of motion is present. Skin is warm and dry without rash. Neurologic: Mental status is normal, cranial nerves are intact, there are no motor or sensory deficits.  ED  Treatments / Results  Labs (all labs ordered are listed, but only abnormal results are displayed) Labs Reviewed  BASIC METABOLIC PANEL  TROPONIN I  CBC WITH DIFFERENTIAL/PLATELET  I-STAT BETA HCG BLOOD, ED ([facility], [facility], [facility] ONLY)    EKG  EKG Interpretation  Date/Time:  Thursday June 06 2016 04:04:57 EST Ventricular Rate:  156 PR Interval:    QRS Duration: 93 QT Interval:  305 QTC Calculation: 459 R Axis:   89 Text Interpretation:  Atrial fibrillation with rapid ventricular response Ventricular premature complex Probable anterior infarct, age indeterminate When compared with ECG of 02/17/2014, HEART RATE has increased Confirmed by [facility]  MD, Sebastion Jun ([id number]) on 06/06/2016 4:22:48 AM       EKG Interpretation  Date/Time:  Thursday June 06 2016 06:49:15 EST Ventricular Rate:  91 PR Interval:    QRS Duration: 96 QT Interval:  368 QTC Calculation: 453 R Axis:   101 Text Interpretation:  Sinus rhythm Consider left atrial enlargement Borderline right axis deviation Low voltage, precordial leads Borderline T abnormalities, diffuse leads When compared with ECG of EARLIER SAME DATE Sinus rhythm has replaced Atrial fibrillation with rapid ventricular response Confirmed by [facility]  MD, Gaylene Moylan ([id number]) on 06/06/2016 6:59:35 AM      Procedures Procedures (including critical care time)  Medications Ordered in ED Medications  diltiazem (CARDIZEM) 1 mg/mL load via infusion 20 mg (20 mg Intravenous Bolus from Bag 06/06/16 0439)    And  diltiazem (CARDIZEM) 100 mg in dextrose 5 % 100 mL (1 mg/mL) infusion (5 mg/hr Intravenous New Bag/Given 06/06/16 0439)  flecainide (TAMBOCOR) tablet 300 mg (300 mg Oral Given 06/06/16 0507)  aspirin chewable tablet 324 mg (324 mg Oral Given 06/06/16 8295)     Initial Impression / Assessment and Plan / ED Course  I have reviewed the triage vital signs and the nursing notes.  Pertinent lab results that were available during my care of the patient were  reviewed by me and considered in my medical decision making (see chart for details).  Clinical Course    Paroxysmal atrial fibrillation. She currently is them demonstrating a rapid ventricular response. Old records are reviewed confirming prior episodes of atrial fibrillation with rapid ventricular response. Patient was offered the option of rate control with diltiazem and trial of oral flecainide versus immediate DC cardioversion. She has requested we try the oral flecainide and this is ordered along with diltiazem.  CHADS-VASC score is 1, correlating to a 0.9% annual risk of stroke/TIA/systemic embolism. She saw Dr. Purvis Sheffield on 09/09/2014 for paroxysmal atrial fibrillation, and was not placed on anticoagulation. She was also seen by Dr. Johney Frame on 09/30/2011 for paroxysmal atrial fibrillation and not placed on anticoagulants (CHADS2 score of 0 because CHADS2 does not give a point for  female sex). With that history, I will defer decision on systemic anticoagulation to her cardiologist, Dr. Purvis SheffieldKoneswaran.  0500: Heart rate has come down into the 80s. She is given a dose of oral flecainide.   6:02 AM I had further discussion with the patient. She now tells me that she has had several episodes of atrial fibrillation over the last 2 months, but they had been self-limited. She came in tonight because it persisted at home. Importance of follow-up with her cardiologist was stressed and she will be started on daily aspirin.  7:02 AM Rhythm is converted to sinus. Patient is requesting flecainide to use if needed. She is given a prescription for to 150 mg flecainide tablets and advised to follow-up with her cardiologist as soon as possible.  Final Clinical Impressions(s) / ED Diagnoses   Final diagnoses:  Paroxysmal atrial fibrillation with rapid ventricular response (HCC)    New Prescriptions New Prescriptions   FLECAINIDE (TAMBOCOR) 150 MG TABLET    Take two tablets when you feel you are in atrial  fibrillation. If you don't get better in four hours, go to the ED     Dione Boozeavid Pearley Millington, MD 06/06/16 [phone]

## 2016-06-12 ENCOUNTER — Telehealth: Payer: Self-pay | Admitting: Adult Health

## 2016-06-12 MED ORDER — DILTIAZEM HCL 30 MG PO TABS: ORAL_TABLET | ORAL | 3 refills | Status: DC

## 2016-06-12 NOTE — Telephone Encounter (Signed)
Pt was seen in ER --she would like to know if she can get a Rx for her diltiazem (CARDIZEM) 30 MG tablet [[id number]][[id number]]  --she's scheduled to see KL on 12/13

## 2016-06-12 NOTE — Telephone Encounter (Signed)
Sent in 30 day supply. 

## 2016-06-25 ENCOUNTER — Ambulatory Visit (INDEPENDENT_AMBULATORY_CARE_PROVIDER_SITE_OTHER): Payer: BLUE CROSS/BLUE SHIELD | Admitting: Adult Health

## 2016-06-25 ENCOUNTER — Encounter: Payer: Self-pay | Admitting: Adult Health

## 2016-06-25 VITALS — BP 120/70 | HR 95 | Ht 67.0 in | Wt 293.0 lb

## 2016-06-25 DIAGNOSIS — I480 Paroxysmal atrial fibrillation: Secondary | ICD-10-CM

## 2016-06-25 MED ORDER — DILTIAZEM HCL 30 MG PO TABS: ORAL_TABLET | ORAL | 3 refills | Status: DC

## 2016-06-25 NOTE — Progress Notes (Signed)
Cardiology Office Note   Date:  06/25/2016   ID:  Deborah GrumblesQuatisha R Dantuono, DOB 03/14/1986, MRN [id number]  PCP:  Ignatius Speckinghruv B Vyas, MD  Cardiologist: Inis SizerKoneswaran/  Kathryn Lawrence, NP   Chief Complaint  Patient presents with  . Atrial Fibrillation      History of Present Illness: Deborah Riddle is a 30 y.o. female who is normally seen in the [place] office presents for ongoing assessment and management of paroxysmal atrial fibrillation, with multiple medication changes due to noncompliance and intolerance. She was also offered possible radiofrequency ablation. She has been to the emergency room on multiple occasions. She missed anxiety and being under a lot of stress. She was seen by Dr. Johney FrameAllred on 12/21/2014, continue take diltiazem as needed, remain on flecainide and metoprolol. Due to severe morbid obesity, she was sent for bariatric surgical clinic referral for consideration for weight loss surgery.  She was seen in the emergency room on 06/06/2016 with chest pain rapid heart rhythm. She had not been taking her medications as directed. She is advised to continue take metoprolol and flecainide as directed she was not placed on anticoagulation therapy.  She comes today stating that she has not had any recurrence of rapid heart rate is ER visit. She remains on diltiazem 30 mg when necessary palpitations. She is trying to get pregnant and therefore flecainide and metoprolol were not provided for her. She's being followed by OB/GYN. She has iron deficiency anemia which is being monitored by her primary care.  She states when she saw EP, she was offered ablation but shows weight loss instead. She did lose 50 pounds but has gained it all back on hormone therapy. Since she's gained weight back she's having more episodes of rapid heart rhythm. She has had a sleep study and was ruled out for sleep apnea in the past   Past Medical History:  Diagnosis Date  . Body mass index 40.0-44.9, adult (HCC)   .  Chest pain, unspecified   . Morbid obesity (HCC)   . Paroxysmal atrial fibrillation (HCC)   . Polycystic disease, ovaries   . Tobacco use disorder     Past Surgical History:  Procedure Laterality Date  . none       Current Outpatient Prescriptions  Medication Sig Dispense Refill  . folic acid (FOLVITE) 800 MCG tablet Take 400 mcg by mouth daily.    Marland Kitchen. letrozole (FEMARA) 2.5 MG tablet Take 2.5 mg by mouth as directed.    . metFORMIN (GLUCOPHAGE) 500 MG tablet Take 500 mg by mouth 2 (two) times daily with a meal.    . Vitamin D, Ergocalciferol, (DRISDOL) 50000 UNITS CAPS capsule Take 1 capsule by mouth once a week.    . diltiazem (CARDIZEM) 30 MG tablet Take 1 tablet daily as needed for palpitations (Patient not taking: Reported on 06/25/2016) 30 tablet 3   No current facility-administered medications for this visit.     Allergies:   Heparin    Social History:  The patient  reports that she quit smoking about 4 years ago. Her smoking use included Cigarettes. She started smoking about 12 years ago. She has a 12.00 pack-year smoking history. She has never used smokeless tobacco. She reports that she drinks alcohol. She reports that she does not use drugs.   Family History:  The patient's family history includes Arrhythmia (age of onset: 5550) in her mother.    ROS: All other systems are reviewed and negative. Unless otherwise mentioned in H&P  PHYSICAL EXAM: VS:  BP 120/70   Pulse 95   Ht 5\' 7"  (1.702 m)   Wt 293 lb (132.9 kg)   SpO2 98%   BMI 45.89 kg/m  , BMI Body mass index is 45.89 kg/m. GEN: Well nourished, well developed, in no acute distress Obese HEENT: normal significant sinus congestion with frequent sniffing. Neck: no JVD, carotid bruits, or masses Cardiac: RRR; tachycardic, no murmurs, rubs, or gallops,no edema  Respiratory:  clear to auscultation bilaterally, normal work of breathing GI: soft, nontender, nondistended, + BS MS: no deformity or atrophy  Skin:  warm and dry, no rash Neuro:  Strength and sensation are intact Psych: euthymic mood, full affect   EKG:  The ekg ordered today demonstrates NSR HR 80 bpm.   Recent Labs: 06/06/2016: BUN 11; Creatinine, Ser 0.95; Hemoglobin 14.1; Platelets 226; Potassium 3.6; Sodium 139    Lipid Panel No results found for: CHOL, TRIG, HDL, CHOLHDL, VLDL, LDLCALC, LDLDIRECT    Wt Readings from Last 3 Encounters:  06/25/16 293 lb (132.9 kg)  06/06/16 295 lb (133.8 kg)  12/21/14 293 lb 3.2 oz (133 kg)      Other studies Reviewed: Additional studies/ records that were reviewed today include:  Echocardiogram, 08/16/2011 1. Estimated EF 60-65%. 2. Global left ventricular wall motion and contractility within normal limits. 3. Normal left ventricular diastolic filling is observed 4. Left atrial chamber size is normal 5. Right ventricular global systolic function is normal 6. There is no evidence of aortic regurg. 7. There is mild mitral regurg. 8. There is mild tricuspid regurg 9. The right ventricular systolic pressure is calculated at 26 mmHg 10. There is no evidence of pulmonic regurg 11. No pericardial effusion 12. Inferior vena cava appears normal.  ASSESSMENT AND PLAN:  1. Paroxysmal atrial fibrillation: She is an ruled out for obstructive sleep apnea. She was seen in the emergency room with rapid heart rhythm. She is not on any medications regularly. Only using diltiazem flecainide and metoprolol when necessary. She is trying to get pregnant. I've advised her to continue diltiazem currently. Will not reorder metoprolol or flecainide now. If she does need a beta blocker would recommend atenolol instead of metoprolol. Would not use flecainide in pregnancy. She is currently not on anticoagulation therapy. Chads VASC score 0.  2. Anemia: Iron deficiency. She has not been taking her medication regularly. She is advised to do so especially as she is trying to become pregnant. Follow with OB/GYN,  for ongoing labs and management.  3. Chronic sinusitis: She is advised to take Mucinex and antiallergy medication fexofenadine. Follow with primary care for ongoing management with possible referral to ENT if clinically indicated.   Current medicines are reviewed at length with the patient today.    Labs/ tests ordered today include:  No orders of the defined types were placed in this encounter.    Disposition:   FU with 6 Months Signed, Joni ReiningKathryn Lawrence, NP  06/25/2016 3:30 PM    [facility] [address], [place], [place] [address] Phone: [phone]; Fax: [phone]

## 2016-06-25 NOTE — Progress Notes (Signed)
Name: Deborah Riddle    DOB: 06/10/1986  Age: 30 y.o.  MR#: [id number]       PCP:  Ignatius Speckinghruv B Vyas, MD      Insurance: Payor: BLUE CROSS BLUE [employer]IELD / Plan: BCBS OTHER / Product Type: *No Product type* /   CC:   No chief complaint on file.   VS Vitals:   06/25/16 1508  Pulse: 95  SpO2: 98%  Weight: 293 lb (132.9 kg)  Height: 5\' 7"  (1.702 m)    Weights Current Weight  06/25/16 293 lb (132.9 kg)  06/06/16 295 lb (133.8 kg)  12/21/14 293 lb 3.2 oz (133 kg)    Blood Pressure  BP Readings from Last 3 Encounters:  06/06/16 107/72  12/21/14 124/84  09/09/14 114/78     Admit date:  (Not on file) Last encounter with R[facility]:  06/12/2016   Allergy Heparin  Current Outpatient Prescriptions  Medication Sig Dispense Refill  . folic acid (FOLVITE) 800 MCG tablet Take 400 mcg by mouth daily.    Marland Kitchen. letrozole (FEMARA) 2.5 MG tablet Take 2.5 mg by mouth as directed.    . metFORMIN (GLUCOPHAGE) 500 MG tablet Take 500 mg by mouth 2 (two) times daily with a meal.    . Vitamin D, Ergocalciferol, (DRISDOL) 50000 UNITS CAPS capsule Take 1 capsule by mouth once a week.    . diltiazem (CARDIZEM) 30 MG tablet Take 1 tablet daily as needed for palpitations (Patient not taking: Reported on 06/25/2016) 30 tablet 3   No current facility-administered medications for this visit.     Discontinued Meds:    Medications Discontinued During This Encounter  Medication Reason  . Chorionic Gonadotropin (PREGNYL IM) Error  . flecainide (TAMBOCOR) 150 MG tablet Error    Patient Active Problem List   Diagnosis Date Noted  . Morbid obesity (HCC) 12/22/2014  . Chest pain 09/23/2011  . Paroxysmal atrial fibrillation (HCC)   . Palpitations 03/23/2009    LABS    Component Value Date/Time   NA 139 06/06/2016 0425   NA 141 02/17/2014 0335   NA 139 11/07/2013 0450   K 3.6 06/06/2016 0425   K 3.9 02/17/2014 0335   K 3.7 11/07/2013 0450   CL 107 06/06/2016 0425   CL 104 02/17/2014 0335   CL 103  11/07/2013 0450   CO2 25 06/06/2016 0425   CO2 22 02/17/2014 0335   CO2 25 11/07/2013 0450   GLUCOSE 112 (H) 06/06/2016 0425   GLUCOSE 95 02/17/2014 0335   GLUCOSE 112 (H) 11/07/2013 0450   BUN 11 06/06/2016 0425   BUN 11 02/17/2014 0335   BUN 10 11/07/2013 0450   CREATININE 0.95 06/06/2016 0425   CREATININE 0.93 02/17/2014 0335   CREATININE 0.82 11/07/2013 0450   CALCIUM 9.4 06/06/2016 0425   CALCIUM 9.0 02/17/2014 0335   CALCIUM 9.2 11/07/2013 0450   GFRNONAA >60 06/06/2016 0425   GFRNONAA 82 (L) 02/17/2014 0335   GFRNONAA >90 11/07/2013 0450   GFRAA >60 06/06/2016 0425   GFRAA >90 02/17/2014 0335   GFRAA >90 11/07/2013 0450   CMP     Component Value Date/Time   NA 139 06/06/2016 0425   K 3.6 06/06/2016 0425   CL 107 06/06/2016 0425   CO2 25 06/06/2016 0425   GLUCOSE 112 (H) 06/06/2016 0425   BUN 11 06/06/2016 0425   CREATININE 0.95 06/06/2016 0425   CALCIUM 9.4 06/06/2016 0425   PROT 7.8 09/23/2013 2101   ALBUMIN 3.6 09/23/2013 2101  AST 21 09/23/2013 2101   ALT 16 09/23/2013 2101   ALKPHOS 85 09/23/2013 2101   BILITOT 0.2 (L) 09/23/2013 2101   GFRNONAA >60 06/06/2016 0425   GFRAA >60 06/06/2016 0425       Component Value Date/Time   WBC 9.8 06/06/2016 0425   WBC 8.7 11/07/2013 0450   WBC 9.9 09/23/2013 2101   HGB 14.1 06/06/2016 0425   HGB 13.6 11/07/2013 0450   HGB 13.3 09/23/2013 2101   HCT 40.8 06/06/2016 0425   HCT 39.2 11/07/2013 0450   HCT 38.2 09/23/2013 2101   MCV 70.8 (L) 06/06/2016 0425   MCV 72.9 (L) 11/07/2013 0450   MCV 73.7 (L) 09/23/2013 2101    Lipid Panel  No results found for: CHOL, TRIG, HDL, CHOLHDL, VLDL, LDLCALC, LDLDIRECT  ABG No results found for: PHART, PCO2ART, PO2ART, HCO3, TCO2, ACIDBASEDEF, O2SAT   Lab Results  Component Value Date   TSH 3.490 09/22/2011   BNP (last 3 results) No results for input(s): BNP in the last 8760 hours.  ProBNP (last 3 results) No results for input(s): PROBNP in the last 8760  hours.  Cardiac Panel (last 3 results) No results for input(s): CKTOTAL, CKMB, TROPONINI, RELINDX in the last 72 hours.  Iron/TIBC/Ferritin/ %Sat No results found for: IRON, TIBC, FERRITIN, IRONPCTSAT   EKG Orders placed or performed during the hospital encounter of 06/06/16  . EKG 12-Lead  . EKG 12-Lead  . EKG 12-Lead  . EKG 12-Lead  . EKG     Prior Assessment and Plan Problem List as of 06/25/2016 Reviewed: 09/09/2014 10:06 AM by Prentice DockerSuresh Koneswaran, MD     Cardiovascular and Mediastinum   Paroxysmal atrial fibrillation [facility](HCC)   Last Assessment & Plan 09/30/2011 Office Visit Written 09/30/2011 10:00 AM by Hillis RangeJames Allred, MD    The patient has symptomatic recurrent afib.  She has had SOB and wheezing which she attributed to flecainide, though metoprolol would have been more likely to have caused this. Therapeutic strategies for afib including medicine and ablation were discussed in detail with the patient today. Risk, benefits, and alternatives to EP study and radiofrequency ablation for afib were also discussed in detail today.  At this time, she would like to continue medical therapy.  I will therefore stop metoprolol and start verapamil 180mg  daily.  She should retry flecainide 50mg  BID once stable on verapamil. Continue ASA for CHADS2 score of 0 (she denies h/o diabetes and takes metfomin for PCOS).  The importance of weight reduction was stressed today. She has a sleep study planned. She is wearing an event monitor which I will review upon return in 6 weeks.        Other   Palpitations   Chest pain   Morbid obesity (HCC)       Imaging: No results found.

## 2016-06-25 NOTE — Patient Instructions (Signed)
Your physician wants you to follow-up in: 6 months K Lawrence NP You will receive a reminder letter in the mail two months in advance. If you don't receive a letter, please call our office to schedule the follow-up appointment.     Your physician recommends that you continue on your current medications as directed. Please refer to the Current Medication list given to you today.   Thank you for choosing [facility] !        

## 2017-12-28 ENCOUNTER — Other Ambulatory Visit: Payer: Self-pay

## 2017-12-28 ENCOUNTER — Encounter (HOSPITAL_COMMUNITY): Payer: Self-pay | Admitting: Emergency Medicine

## 2017-12-28 ENCOUNTER — Emergency Department (HOSPITAL_COMMUNITY): Payer: BLUE CROSS/BLUE SHIELD

## 2017-12-28 ENCOUNTER — Emergency Department (HOSPITAL_COMMUNITY)
Admission: EM | Admit: 2017-12-28 | Discharge: 2017-12-28 | Disposition: A | Discharge status: Home / Self Care | Payer: BLUE CROSS/BLUE SHIELD | Attending: Emergency Medicine | Admitting: Emergency Medicine

## 2017-12-28 DIAGNOSIS — Y999 Unspecified external cause status: Secondary | ICD-10-CM | POA: Diagnosis not present

## 2017-12-28 DIAGNOSIS — K0889 Other specified disorders of teeth and supporting structures: Secondary | ICD-10-CM

## 2017-12-28 DIAGNOSIS — S92912A Unspecified fracture of left toe(s), initial encounter for closed fracture: Secondary | ICD-10-CM

## 2017-12-28 DIAGNOSIS — Y939 Activity, unspecified: Secondary | ICD-10-CM | POA: Insufficient documentation

## 2017-12-28 DIAGNOSIS — Z7984 Long term (current) use of oral hypoglycemic drugs: Secondary | ICD-10-CM | POA: Diagnosis not present

## 2017-12-28 DIAGNOSIS — K047 Periapical abscess without sinus: Secondary | ICD-10-CM | POA: Insufficient documentation

## 2017-12-28 DIAGNOSIS — W228XXA Striking against or struck by other objects, initial encounter: Secondary | ICD-10-CM | POA: Diagnosis not present

## 2017-12-28 DIAGNOSIS — Z79899 Other long term (current) drug therapy: Secondary | ICD-10-CM | POA: Diagnosis not present

## 2017-12-28 DIAGNOSIS — Z87891 Personal history of nicotine dependence: Secondary | ICD-10-CM | POA: Diagnosis not present

## 2017-12-28 DIAGNOSIS — Y929 Unspecified place or not applicable: Secondary | ICD-10-CM | POA: Insufficient documentation

## 2017-12-28 MED ORDER — DEXAMETHASONE SODIUM PHOSPHATE 10 MG/ML IJ SOLN
10.0000 mg | Freq: Once | INTRAMUSCULAR | Status: AC
  Administered 2017-12-28: 10 mg via INTRAMUSCULAR
  Filled 2017-12-28: qty 1

## 2017-12-28 MED ORDER — MORPHINE SULFATE (PF) 10 MG/ML IV SOLN
Freq: Once | INTRAVENOUS | Status: AC
  Administered 2017-12-28: 10 mg via INTRAMUSCULAR
  Filled 2017-12-28: qty 1

## 2017-12-28 MED ORDER — HYDROCODONE-ACETAMINOPHEN 5-325 MG PO TABS: 1.0000 | ORAL_TABLET | ORAL | 0 refills | Status: DC | PRN

## 2017-12-28 MED ORDER — ONDANSETRON HCL 4 MG PO TABS: 4.0000 mg | ORAL_TABLET | Freq: Four times a day (QID) | ORAL | 0 refills | Status: DC

## 2017-12-28 MED ORDER — PROMETHAZINE HCL 12.5 MG PO TABS
12.5000 mg | ORAL_TABLET | Freq: Once | ORAL | Status: AC
  Administered 2017-12-28: 12.5 mg via ORAL
  Filled 2017-12-28: qty 1

## 2017-12-28 MED ORDER — ONDANSETRON HCL 4 MG PO TABS
4.0000 mg | ORAL_TABLET | Freq: Once | ORAL | Status: AC
  Administered 2017-12-28: 4 mg via ORAL
  Filled 2017-12-28: qty 1

## 2017-12-28 NOTE — ED Provider Notes (Signed)
[facility] EMERGENCY DEPARTMENT Provider Note   CSN: [id number] Arrival date & time: 12/28/17  0901     History   Chief Complaint Chief Complaint  Patient presents with  . Dental Pain    HPI Deborah Riddle is a 32 y.o. female.  Patient is a 32 year old female who presents to the emergency department with right upper dental area pain.  The patient states that she has been having problems with pain and some swelling of her right upper dental area.  This started on Friday June 14.  She went to see the dentist on yesterday June 15 was told that her tooth was infected, and that they could not do anything for it at that time.  The patient was placed on Amoxil, ibuprofen, and Tylenol codeine.  The patient states that she has been taking the ibuprofen and the Tylenol codeine and the Amoxil as prescribed, and it seems as though the pain is getting worse instead of better.  She is here today to request assistance with her pain and to get some form of relief.  Patient also states that she is having problems with nausea from taking the medications.     Past Medical History:  Diagnosis Date  . Body mass index 40.0-44.9, adult (HCC)   . Chest pain, unspecified   . Morbid obesity (HCC)   . Paroxysmal atrial fibrillation (HCC)   . Polycystic disease, ovaries   . Tobacco use disorder     Patient Active Problem List   Diagnosis Date Noted  . Morbid obesity (HCC) 12/22/2014  . Chest pain 09/23/2011  . Paroxysmal atrial fibrillation (HCC)   . Palpitations 03/23/2009    Past Surgical History:  Procedure Laterality Date  . none    . WISDOM TOOTH EXTRACTION       OB History   None      Home Medications    Prior to Admission medications   Medication Sig Start Date End Date Taking? Authorizing Provider  diltiazem (CARDIZEM) 30 MG tablet Take 1 tablet daily as needed for palpitations 06/25/16   Jodelle GrossLawrence, Kathryn M, NP  folic acid (FOLVITE) 800 MCG tablet Take 400 mcg by mouth  daily.    [provider]  letrozole (FEMARA) 2.5 MG tablet Take 2.5 mg by mouth as directed.    [provider]  metFORMIN (GLUCOPHAGE) 500 MG tablet Take 500 mg by mouth 2 (two) times daily with a meal.    [provider]  Vitamin D, Ergocalciferol, (DRISDOL) 50000 UNITS CAPS capsule Take 1 capsule by mouth once a week. 04/22/14   [provider]  metoprolol tartrate (LOPRESSOR) 25 MG tablet Take 25 mg by mouth daily as needed. For racing heart  09/30/11  [provider]    Family History Family History  Problem Relation Age of Onset  . Arrhythmia Mother 8050       History of Cardiac Stents  . Diabetes Unknown        Family History  . Hypertension Unknown        Family History    Social History Social History   Tobacco Use  . Smoking status: Former Smoker    Packs/day: 1.00    Years: 12.00    Pack years: 12.00    Types: Cigarettes    Start date: 01/21/2004    Last attempt to quit: 09/10/2011    Years since quitting: 6.3  . Smokeless tobacco: Never Used  Substance Use Topics  . Alcohol use: Never  Alcohol/week: 0.0 oz    Frequency: Never  . Drug use: No     Allergies   Heparin   Review of Systems Review of Systems  Constitutional: Negative for activity change.       All ROS Neg except as noted in HPI  HENT: Positive for dental problem. Negative for nosebleeds.        Facial pain  Eyes: Negative for photophobia and discharge.  Respiratory: Negative for cough, shortness of breath and wheezing.   Cardiovascular: Negative for chest pain and palpitations.  Gastrointestinal: Positive for nausea. Negative for abdominal pain and blood in stool.  Genitourinary: Negative for dysuria, frequency and hematuria.  Musculoskeletal: Negative for arthralgias, back pain and neck pain.  Skin: Negative.   Neurological: Positive for headaches. Negative for dizziness, seizures and speech difficulty.  Psychiatric/Behavioral: Negative for  confusion and hallucinations.     Physical Exam Updated Vital Signs BP (!) 149/95 (BP Location: Right Arm)   Pulse 80   Temp 99.3 F (37.4 C) (Oral)   Resp 18   Ht 5\' 8"  (1.727 m)   Wt 133.8 kg (295 lb)   SpO2 99%   BMI 44.85 kg/m   Physical Exam  Constitutional: She is oriented to person, place, and time. She appears well-developed and well-nourished.  Non-toxic appearance.  HENT:  Head: Normocephalic.  Right Ear: Tympanic membrane and external ear normal.  Left Ear: Tympanic membrane and external ear normal.  Mouth/Throat: Abnormal dentition. Dental caries present.  Dental caries of the right upper gum area.  There is some swelling of the right upper gum, but no visible abscess.  The airway is patent.  There is no swelling under the tongue.  No trismus noted.  Eyes: Pupils are equal, round, and reactive to light. EOM and lids are normal.  Neck: Normal range of motion. Neck supple. Carotid bruit is not present.  Cardiovascular: Normal rate, regular rhythm, normal heart sounds, intact distal pulses and normal pulses.  Pulmonary/Chest: Breath sounds normal. No respiratory distress. She has no wheezes.  Abdominal: Soft. Bowel sounds are normal. There is no tenderness. There is no guarding.  Musculoskeletal: Normal range of motion.  Lymphadenopathy:       Head (right side): No submandibular adenopathy present.       Head (left side): No submandibular adenopathy present.    She has no cervical adenopathy.  Neurological: She is alert and oriented to person, place, and time. She has normal strength. No cranial nerve deficit or sensory deficit. Coordination normal.  Skin: Skin is warm and dry.  Psychiatric: She has a normal mood and affect. Her speech is normal.  Nursing note and vitals reviewed.    ED Treatments / Results  Labs (all labs ordered are listed, but only abnormal results are displayed) Labs Reviewed - No data to display  EKG None  Radiology No results  found.  Procedures Procedures (including critical care time)  Medications Ordered in ED Medications  ondansetron (ZOFRAN) tablet 4 mg (has no administration in time range)     Initial Impression / Assessment and Plan / ED Course  I have reviewed the triage vital signs and the nursing notes.  Pertinent labs & imaging results that were available during my care of the patient were reviewed by me and considered in my medical decision making (see chart for details).      Final Clinical Impressions(s) / ED Diagnoses MDM  Vital signs reviewed.  No temperature changes noted of the right versus left  face area.  There is some tenderness on the right to palpation.  There is no evidence of Ludwig's angina or other emergent changes.   I informed patient that 24 hours may not be enough time to give her medications a chance to help.  Patient was treated with intramuscular pain medication here in the emergency department.  Patient will be given a prescription for Zofran, as well as a prescription for Norco.  I will asked the patient to stop the Tylenol codeine for now as this may be the culprit for her nausea.  Patient is to follow-up with the dentist as soon as possible.   Final diagnoses:  Dental infection  Pain, dental    ED Discharge Orders        Ordered    ondansetron (ZOFRAN) 4 MG tablet  Every 6 hours     12/28/17 1028    HYDROcodone-acetaminophen (NORCO/VICODIN) 5-325 MG tablet  Every 4 hours PRN     12/28/17 1028       Ivery Quale, PA-C 12/28/17 1031    Raeford Razor, MD 12/28/17 1341

## 2017-12-28 NOTE — ED Triage Notes (Signed)
Patient c/o right upper dental pain that started on Friday in which she went to dentist yesterday. Per patient told "tooth was infected" and given prescription for tylenol 3, ibuprofen, and antibiotic(amoxicillin). Patient last took ibuprofen at 3am, and tylenol 3 at 4am. Patient states that medication is not helping with pain and now she is nauseated. Patient also c/o left 2nd toe, per patient accidentally hit toe. Toe red and swollen.

## 2017-12-28 NOTE — Discharge Instructions (Addendum)
You were treated with intramuscular pain medication today, and this may cause drowsiness.  Please use caution getting around today.  Please stop your Tylenol codeine, and use hydrocodone every 4-6 hours as needed for pain not improved by ibuprofen and Tylenol.  Continue your antibiotic as ordered.  You have been given a prescription for Zofran to assist with nausea if needed.  Please see your dentist as soon as possible. You have a avulsion fracture of the left second toe. Use the wooden shoe until you can safely apply weight to the foot in your regular shoes. See your MD or Dr Romeo AppleHarrison for additional evaluation and follow up.

## 2017-12-30 ENCOUNTER — Encounter: Payer: Self-pay | Admitting: Orthopedic Surgery

## 2017-12-30 ENCOUNTER — Ambulatory Visit (INDEPENDENT_AMBULATORY_CARE_PROVIDER_SITE_OTHER): Payer: BLUE CROSS/BLUE SHIELD | Admitting: Orthopedic Surgery

## 2017-12-30 VITALS — BP 121/74 | HR 75 | Ht 68.0 in | Wt 295.0 lb

## 2017-12-30 DIAGNOSIS — S92502A Displaced unspecified fracture of left lesser toe(s), initial encounter for closed fracture: Secondary | ICD-10-CM

## 2017-12-30 NOTE — Progress Notes (Signed)
NEW PATIENT OFFICE VISIT   Chief Complaint  Patient presents with  . Foot Pain    hit left 2nd toe on tire on 12/27/17 pain since     MEDICAL DECISION SECTION  Xrays were done at [facility] wrist  Office films foot    My independent reading of xrays:  The hospital films show a nondisplaced fracture of the left distal radius torus fracture  The left foot x-ray in the office was normal  Encounter Diagnosis  Name Primary?  . Closed fracture of phalanx of left second toe, initial encounter Yes    PLAN: (Rx., injectx, surgery, frx, mri/ct) Postop shoe for comfort ease into regular shoe when comfortable  No orders of the defined types were placed in this encounter.       Chief Complaint  Patient presents with  . Foot Pain    hit left 2nd toe on tire on 12/27/17 pain since    32 year old female stubbed her toe on the 15th comes in with pain in the second toe some swelling some painful gait with weightbearing   Review of Systems  Constitutional: Negative for chills and fever.  Neurological: Positive for tingling.     Past Medical History:  Diagnosis Date  . Body mass index 40.0-44.9, adult (HCC)   . Chest pain, unspecified   . Morbid obesity (HCC)   . Paroxysmal atrial fibrillation (HCC)   . Polycystic disease, ovaries   . Tobacco use disorder     Past Surgical History:  Procedure Laterality Date  . none    . WISDOM TOOTH EXTRACTION      Family History  Problem Relation Age of Onset  . Arrhythmia Mother 37       History of Cardiac Stents  . Hypertension Mother   . Diabetes Mother   . COPD Mother   . Hypertension Father   . Diabetes Unknown        Family History  . Hypertension Unknown        Family History  . Diabetes Sister   . Hypertension Sister    Social History   Tobacco Use  . Smoking status: Former Smoker    Packs/day: 1.00    Years: 12.00    Pack years: 12.00    Types: Cigarettes    Start date: 01/21/2004    Last  attempt to quit: 09/10/2011    Years since quitting: 6.3  . Smokeless tobacco: Never Used  Substance Use Topics  . Alcohol use: Never    Alcohol/week: 0.0 oz    Frequency: Never  . Drug use: No    Allergies  Allergen Reactions  . Heparin Other (See Comments)    Lowers BP Lowers blood pressure     Current Meds  Medication Sig  . amoxicillin (AMOXIL) 500 MG capsule TAKE ONE CAPSULE BY MOUTH 4 TIMES A DAY UNTIL FINISHED  . diltiazem (CARDIZEM) 30 MG tablet Take 1 tablet daily as needed for palpitations  . folic acid (FOLVITE) 800 MCG tablet Take 400 mcg by mouth daily.  Marland Kitchen HYDROcodone-acetaminophen (NORCO/VICODIN) 5-325 MG tablet Take 1 tablet by mouth every 4 (four) hours as needed.  Marland Kitchen ibuprofen (ADVIL,MOTRIN) 800 MG tablet Take 800 mg by mouth every 8 (eight) hours as needed.  . ondansetron (ZOFRAN) 4 MG tablet Take 1 tablet (4 mg total) by mouth every 6 (six) hours.  . Vitamin D, Ergocalciferol, (DRISDOL) 50000 UNITS CAPS capsule Take 1 capsule by mouth once a week.    BP  121/74   Pulse 75   Ht 5\' 8"  (1.727 m)   Wt 295 lb (133.8 kg)   BMI 44.85 kg/m   Physical Exam  Constitutional: She is oriented to person, place, and time. She appears well-developed and well-nourished.  Neurological: She is alert and oriented to person, place, and time.  Psychiatric: She has a normal mood and affect. Judgment normal.  Vitals reviewed.   Ortho Exam  Normal alignment tenderness at the IP joints of the second digit neurovascular exam intact skin is intact no atrophy no instability of the MTP or IP joints  Fuller CanadaStanley Jaleen Grupp, MD  12/30/2017 3:33 PM

## 2018-01-25 ENCOUNTER — Other Ambulatory Visit: Payer: Self-pay

## 2018-01-25 ENCOUNTER — Encounter (HOSPITAL_COMMUNITY): Payer: Self-pay | Admitting: Emergency Medicine

## 2018-01-25 ENCOUNTER — Emergency Department (HOSPITAL_COMMUNITY)
Admission: EM | Admit: 2018-01-25 | Discharge: 2018-01-25 | Disposition: A | Discharge status: Home / Self Care | Payer: BLUE CROSS/BLUE SHIELD | Attending: Emergency Medicine | Admitting: Emergency Medicine

## 2018-01-25 DIAGNOSIS — R109 Unspecified abdominal pain: Secondary | ICD-10-CM | POA: Insufficient documentation

## 2018-01-25 DIAGNOSIS — Z87891 Personal history of nicotine dependence: Secondary | ICD-10-CM | POA: Diagnosis not present

## 2018-01-25 DIAGNOSIS — Z7984 Long term (current) use of oral hypoglycemic drugs: Secondary | ICD-10-CM | POA: Diagnosis not present

## 2018-01-25 DIAGNOSIS — Z79899 Other long term (current) drug therapy: Secondary | ICD-10-CM | POA: Insufficient documentation

## 2018-01-25 DIAGNOSIS — A0472 Enterocolitis due to Clostridium difficile, not specified as recurrent: Secondary | ICD-10-CM | POA: Insufficient documentation

## 2018-01-25 LAB — CBC
HCT: 39.5 % (ref 36.0–46.0)
Hemoglobin: 13.5 g/dL (ref 12.0–15.0)
MCH: 25.3 pg — AB (ref 26.0–34.0)
MCHC: 34.2 g/dL (ref 30.0–36.0)
MCV: 74.1 fL — ABNORMAL LOW (ref 78.0–100.0)
Platelets: 239 10*3/uL (ref 150–400)
RBC: 5.33 MIL/uL — ABNORMAL HIGH (ref 3.87–5.11)
RDW: 16 % — AB (ref 11.5–15.5)
WBC: 7.9 10*3/uL (ref 4.0–10.5)

## 2018-01-25 LAB — COMPREHENSIVE METABOLIC PANEL
ALBUMIN: 3.7 g/dL (ref 3.5–5.0)
ALK PHOS: 75 U/L (ref 38–126)
ALT: 20 U/L (ref 0–44)
AST: 22 U/L (ref 15–41)
Anion gap: 6 (ref 5–15)
BILIRUBIN TOTAL: 0.5 mg/dL (ref 0.3–1.2)
BUN: 8 mg/dL (ref 6–20)
CALCIUM: 8.8 mg/dL — AB (ref 8.9–10.3)
CO2: 25 mmol/L (ref 22–32)
Chloride: 108 mmol/L (ref 98–111)
Creatinine, Ser: 0.85 mg/dL (ref 0.44–1.00)
GFR calc Af Amer: >60 mL/min (ref >60)
GFR calc non Af Amer: >60 mL/min (ref >60)
GLUCOSE: 91 mg/dL (ref 70–99)
Potassium: 3.9 mmol/L (ref 3.5–5.1)
Sodium: 139 mmol/L (ref 135–145)
TOTAL PROTEIN: 7.6 g/dL (ref 6.5–8.1)

## 2018-01-25 LAB — PREGNANCY, URINE: PREG TEST UR: NEGATIVE

## 2018-01-25 LAB — URINALYSIS, ROUTINE W REFLEX MICROSCOPIC
BILIRUBIN URINE: NEGATIVE
Glucose, UA: NEGATIVE mg/dL
HGB URINE DIPSTICK: NEGATIVE
KETONES UR: NEGATIVE mg/dL
Leukocytes, UA: NEGATIVE
NITRITE: NEGATIVE
PH: 7 (ref 5.0–8.0)
Protein, ur: NEGATIVE mg/dL
Specific Gravity, Urine: 1.023 (ref 1.005–1.030)

## 2018-01-25 LAB — C DIFFICILE QUICK SCREEN W PCR REFLEX
C DIFFICILE (CDIFF) TOXIN: POSITIVE — AB
C DIFFICLE (CDIFF) ANTIGEN: POSITIVE — AB
C Diff interpretation: DETECTED

## 2018-01-25 LAB — LIPASE, BLOOD: Lipase: 26 U/L (ref 11–51)

## 2018-01-25 MED ORDER — SODIUM CHLORIDE 0.9 % IV BOLUS
1000.0000 mL | Freq: Once | INTRAVENOUS | Status: AC
  Administered 2018-01-25: 1000 mL via INTRAVENOUS

## 2018-01-25 MED ORDER — HYDROCODONE-ACETAMINOPHEN 5-325 MG PO TABS
2.0000 | ORAL_TABLET | Freq: Once | ORAL | Status: AC
  Administered 2018-01-25: 1 via ORAL
  Filled 2018-01-25: qty 2

## 2018-01-25 MED ORDER — VANCOMYCIN 50 MG/ML ORAL SOLUTION: 125.0000 mg | Freq: Four times a day (QID) | ORAL | 0 refills | Status: AC

## 2018-01-25 MED ORDER — VANCOMYCIN 50 MG/ML ORAL SOLUTION
125.0000 mg | Freq: Four times a day (QID) | ORAL | Status: DC
  Administered 2018-01-25: 125 mg via ORAL
  Filled 2018-01-25 (×5): qty 2.5

## 2018-01-25 MED ORDER — VANCOMYCIN 50 MG/ML ORAL SOLUTION: 125.0000 mg | Freq: Four times a day (QID) | ORAL | Status: DC

## 2018-01-25 MED ORDER — ONDANSETRON HCL 4 MG/2ML IJ SOLN
4.0000 mg | Freq: Once | INTRAMUSCULAR | Status: AC
  Administered 2018-01-25: 4 mg via INTRAVENOUS
  Filled 2018-01-25: qty 2

## 2018-01-25 MED ORDER — FENTANYL CITRATE (PF) 100 MCG/2ML IJ SOLN
50.0000 ug | Freq: Once | INTRAMUSCULAR | Status: AC
  Administered 2018-01-25: 50 ug via INTRAVENOUS
  Filled 2018-01-25: qty 2

## 2018-01-25 MED ORDER — MELOXICAM 7.5 MG PO TABS: 15.0000 mg | ORAL_TABLET | Freq: Every day | ORAL | 0 refills | Status: DC

## 2018-01-25 NOTE — ED Notes (Signed)
Date and time results received: 01/25/18 5:32 PM    Test: cdiff Critical Value: cdiff +  Name of Provider Notified: Hyacinth MeekerMiller  Orders Received? Or Actions Taken?:

## 2018-01-25 NOTE — Discharge Instructions (Signed)
Your testing shows that you do have C dif Take Vancomycin 4 times daily for 10 days See your doctor in follow up - read attached information ER for worsening symptoms.

## 2018-01-25 NOTE — ED Provider Notes (Signed)
[facility] EMERGENCY DEPARTMENT Provider Note   CSN: [id number] Arrival date & time: 01/25/18  1205     History   Chief Complaint Chief Complaint  Patient presents with  . Abdominal Pain    HPI Deborah Riddle is a 32 y.o. female.  HPI  The patient is a 32 year old female, she has a known history of a recent dental infection for which she underwent treatment with both amoxicillin followed by clindamycin after the infection became worsened.  This helped significantly with the jaw swelling and pain however she has then developed nausea vomiting and diarrhea.  This started 48 hours ago, she did have some abdominal discomfort the night before (3 days ago) but then has developed multiple episodes of diarrhea which she states that sometimes watery, sometimes mucoid sometimes runny, sometimes loose but nonbloody.  There is no associated vomiting though she is nauseated and has not had anything to eat today because of the nausea.  She reports that every time she goes to the bathroom she has the diarrhea.  She has associated abdominal cramping which she states comes in waves like her whole abdomen is cramping up and then it releases and gets better.  She had a CT scan performed by her family doctor on Friday which she reports was totally normal, showed no signs of any problems, her family doctor asked her to come today to the emergency department for evaluation of C. difficile given the recent antibiotic use and new diarrhea.  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  No back pain, no fevers, no swelling.  Past Medical History:  Diagnosis Date  . Body mass index 40.0-44.9, adult (HCC)   . Chest pain, unspecified   . Morbid obesity (HCC)   . Paroxysmal atrial fibrillation (HCC)   . Polycystic disease, ovaries   . Tobacco use disorder     Patient Active Problem List   Diagnosis Date  Noted  . Morbid obesity (HCC) 12/22/2014  . Chest pain 09/23/2011  . Paroxysmal atrial fibrillation (HCC)   . Palpitations 03/23/2009    Past Surgical History:  Procedure Laterality Date  . none    . WISDOM TOOTH EXTRACTION       OB History   None      Home Medications    Prior to Admission medications   Medication Sig Start Date End Date Taking? Authorizing Provider  diltiazem (CARDIZEM) 30 MG tablet Take 1 tablet daily as needed for palpitations 06/25/16  Yes Jodelle Gross, NP  HYDROcodone-acetaminophen (NORCO/VICODIN) 5-325 MG tablet Take 1 tablet by mouth every 4 (four) hours as needed. Patient taking differently: Take 1 tablet by mouth every 4 (four) hours as needed for moderate pain.  12/28/17  Yes Ivery Quale, PA-C  ibuprofen (ADVIL,MOTRIN) 800 MG tablet Take 800 mg by mouth every 8 (eight) hours as needed for moderate pain.  12/27/17  Yes [provider]  letrozole (FEMARA) 2.5 MG tablet Take 2.5 mg by mouth as directed.   Yes [provider]  metFORMIN (GLUCOPHAGE) 500 MG tablet Take 500 mg by mouth 2 (two) times daily with a meal.   Yes [provider]  Vitamin D, Ergocalciferol, (DRISDOL) 50000 UNITS CAPS capsule Take 1 capsule by mouth once a week. Takes on Thursday 04/22/14  Yes [provider]  amoxicillin (AMOXIL) 500 MG capsule TAKE ONE CAPSULE BY MOUTH 4 TIMES A  DAY UNTIL FINISHED 12/27/17   [provider]  meloxicam (MOBIC) 7.5 MG tablet Take 2 tablets (15 mg total) by mouth daily. 01/25/18   Eber Hong, MD  ondansetron (ZOFRAN) 4 MG tablet Take 1 tablet (4 mg total) by mouth every 6 (six) hours. 12/28/17   Ivery Quale, PA-C  vancomycin (VANCOCIN) 50 mg/mL oral solution Take 2.5 mLs (125 mg total) by mouth every 6 (six) hours for 10 days. 01/25/18 02/04/18  Eber Hong, MD  metoprolol tartrate (LOPRESSOR) 25 MG tablet Take 25 mg by mouth daily as needed. For racing heart  09/30/11  [provider]     Family History Family History  Problem Relation Age of Onset  . Arrhythmia Mother 30       History of Cardiac Stents  . Hypertension Mother   . Diabetes Mother   . COPD Mother   . Hypertension Father   . Diabetes Unknown        Family History  . Hypertension Unknown        Family History  . Diabetes Sister   . Hypertension Sister     Social History Social History   Tobacco Use  . Smoking status: Former Smoker    Packs/day: 1.00    Years: 12.00    Pack years: 12.00    Types: Cigarettes    Start date: 01/21/2004    Last attempt to quit: 09/10/2011    Years since quitting: 6.3  . Smokeless tobacco: Never Used  Substance Use Topics  . Alcohol use: Never    Alcohol/week: 0.0 oz    Frequency: Never  . Drug use: No     Allergies   Amoxicillin and Heparin   Review of Systems Review of Systems  All other systems reviewed and are negative.    Physical Exam Updated Vital Signs BP 117/90 (BP Location: Left Arm)   Pulse 85   Temp 98.7 F (37.1 C) (Oral)   Resp 18   Ht 5\' 8"  (1.727 m)   Wt 132.5 kg (292 lb)   LMP 12/22/2017   SpO2 100%   BMI 44.40 kg/m   Physical Exam  Constitutional: She appears well-developed and well-nourished. No distress.  HENT:  Head: Normocephalic and atraumatic.  Mouth/Throat: Oropharynx is clear and moist. No oropharyngeal exudate.  Eyes: Pupils are equal, round, and reactive to light. Conjunctivae and EOM are normal. Right eye exhibits no discharge. Left eye exhibits no discharge. No scleral icterus.  Neck: Normal range of motion. Neck supple. No JVD present. No thyromegaly present.  Cardiovascular: Normal rate, regular rhythm, normal heart sounds and intact distal pulses. Exam reveals no gallop and no friction rub.  No murmur heard. Pulmonary/Chest: Effort normal and breath sounds normal. No respiratory distress. She has no wheezes. She has no rales.  Abdominal: Soft. Bowel sounds are normal. She exhibits no distension and no  mass. There is no tenderness.  The patient has mild diffuse abdominal tenderness which is mild, she has no guarding or peritoneal signs.  This is nonfocal.  Very soft, obese  Musculoskeletal: Normal range of motion. She exhibits no edema or tenderness.  No edema of the legs  Lymphadenopathy:    She has no cervical adenopathy.  Neurological: She is alert. Coordination normal.  The patient is able to give a full history with normal speech, no facial droop, oriented, follows commands without difficulty  Skin: Skin is warm and dry. No rash noted. No erythema.  There is no rash to the skin.  Psychiatric: She has a normal mood and affect. Her behavior is normal.  Nursing note and vitals reviewed.    ED Treatments / Results  Labs (all labs ordered are listed, but only abnormal results are displayed) Labs Reviewed  C DIFFICILE QUICK SCREEN W PCR REFLEX - Abnormal; Notable for the following components:      Result Value   C Diff antigen POSITIVE (*)    C Diff toxin POSITIVE (*)    All other components within normal limits  COMPREHENSIVE METABOLIC PANEL - Abnormal; Notable for the following components:   Calcium 8.8 (*)    All other components within normal limits  CBC - Abnormal; Notable for the following components:   RBC 5.33 (*)    MCV 74.1 (*)    MCH 25.3 (*)    RDW 16.0 (*)    All other components within normal limits  URINALYSIS, ROUTINE W REFLEX MICROSCOPIC - Abnormal; Notable for the following components:   APPearance HAZY (*)    All other components within normal limits  LIPASE, BLOOD  PREGNANCY, URINE    EKG None  Radiology No results found.  Procedures Procedures (including critical care time)  Medications Ordered in ED Medications  vancomycin (VANCOCIN) 50 mg/mL oral solution 125 mg (has no administration in time range)  sodium chloride 0.9 % bolus 1,000 mL (0 mLs Intravenous Stopped 01/25/18 1731)  ondansetron (ZOFRAN) injection 4 mg (4 mg Intravenous Given  01/25/18 1255)  fentaNYL (SUBLIMAZE) injection 50 mcg (50 mcg Intravenous Given 01/25/18 1255)     Initial Impression / Assessment and Plan / ED Course  I have reviewed the triage vital signs and the nursing notes.  Pertinent labs & imaging results that were available during my care of the patient were reviewed by me and considered in my medical decision making (see chart for details).     An evaluation for C. difficile seems reasonable.  The patient has vital signs which are unremarkable, she does have a narrow pulse pressure but no signs of tachycardia fever tachypnea or hypoxia.  Labs pending, no indication for repeat CT scan, patient agreeable to some IV fluids and supportive medications  Labs unremarkable, C. difficile positive, no leukocytosis, no anemia, normal renal function, normal urine.  Patient given vancomycin  Final Clinical Impressions(s) / ED Diagnoses   Final diagnoses:  C. difficile diarrhea    ED Discharge Orders        Ordered    vancomycin (VANCOCIN) 50 mg/mL oral solution  Every 6 hours     01/25/18 1828    meloxicam (MOBIC) 7.5 MG tablet  Daily     01/25/18 1828       Eber HongMiller, Kaitlyne Friedhoff, MD 01/25/18 1831

## 2018-01-25 NOTE — ED Triage Notes (Signed)
Pt states that she has had n/v/d since Friday. Her doctor wants her to be checked for c diff since she just got off of abx for a toothache.

## 2018-03-13 ENCOUNTER — Other Ambulatory Visit (HOSPITAL_COMMUNITY)
Admission: RE | Admit: 2018-03-13 | Discharge: 2018-03-13 | Disposition: A | Discharge status: Home / Self Care | Payer: Self-pay | Source: Ambulatory Visit | Attending: Internal Medicine | Admitting: Internal Medicine

## 2018-03-13 DIAGNOSIS — L732 Hidradenitis suppurativa: Secondary | ICD-10-CM | POA: Insufficient documentation

## 2018-03-13 DIAGNOSIS — R109 Unspecified abdominal pain: Secondary | ICD-10-CM | POA: Insufficient documentation

## 2018-03-13 LAB — C DIFFICILE QUICK SCREEN W PCR REFLEX
C Diff antigen: POSITIVE — AB
C Diff interpretation: DETECTED
C Diff toxin: POSITIVE — AB

## 2018-03-17 ENCOUNTER — Encounter: Payer: Self-pay | Admitting: Gastroenterology

## 2018-03-17 LAB — GASTROINTESTINAL PANEL BY PCR, STOOL (REPLACES STOOL CULTURE)
ADENOVIRUS F40/41: NOT DETECTED
ASTROVIRUS: NOT DETECTED
CAMPYLOBACTER SPECIES: NOT DETECTED
Cryptosporidium: NOT DETECTED
Cyclospora cayetanensis: NOT DETECTED
ENTEROAGGREGATIVE E COLI (EAEC): NOT DETECTED
Entamoeba histolytica: NOT DETECTED
Enteropathogenic E coli (EPEC): NOT DETECTED
Enterotoxigenic E coli (ETEC): DETECTED — AB
Giardia lamblia: NOT DETECTED
Norovirus GI/GII: NOT DETECTED
PLESIMONAS SHIGELLOIDES: NOT DETECTED
ROTAVIRUS A: NOT DETECTED
SALMONELLA SPECIES: NOT DETECTED
Sapovirus (I, II, IV, and V): NOT DETECTED
Shiga like toxin producing E coli (STEC): NOT DETECTED
Shigella/Enteroinvasive E coli (EIEC): NOT DETECTED
Vibrio cholerae: NOT DETECTED
Vibrio species: NOT DETECTED
YERSINIA ENTEROCOLITICA: NOT DETECTED

## 2018-03-18 LAB — OVA + PARASITE EXAM

## 2018-03-18 LAB — O&P RESULT

## 2018-04-29 ENCOUNTER — Ambulatory Visit: Payer: BLUE CROSS/BLUE SHIELD | Admitting: Gastroenterology

## 2018-05-13 ENCOUNTER — Encounter: Payer: Self-pay | Admitting: Gastroenterology

## 2018-05-13 ENCOUNTER — Ambulatory Visit (INDEPENDENT_AMBULATORY_CARE_PROVIDER_SITE_OTHER): Payer: BLUE CROSS/BLUE SHIELD | Admitting: Gastroenterology

## 2018-05-13 DIAGNOSIS — R1010 Upper abdominal pain, unspecified: Secondary | ICD-10-CM

## 2018-05-13 DIAGNOSIS — R109 Unspecified abdominal pain: Secondary | ICD-10-CM | POA: Insufficient documentation

## 2018-05-13 MED ORDER — PANTOPRAZOLE SODIUM 40 MG PO TBEC: DELAYED_RELEASE_TABLET | ORAL | 3 refills | Status: DC

## 2018-05-13 NOTE — Progress Notes (Signed)
Subjective:    Patient ID: Deborah Riddle, female    DOB: [birth year], 32 y.o.   MRN: [id number] Ignatius Specking, MD  HPI BEFORE THIS ALL STARTED HAD BM: 1-2X/DAY. CHANGE IN BOWEL IN HABITS. Had tooth problem. INITIALLY CLINDAMYCIN. 2ND TOOTH PROBLEM: AMOXICILLIN AND IT FAILED AND THEN HAD CLINDAMYCIN IN JUL 2019 GOT C DIFF(BLOODY STOOLS/ABDOMINL PAIN). RX VANCOMYCIN FOR 10 DAYS, 2ND EPISODE OF C DIFF/E COLI(LESS SEVERE): AUG 2019-RX: FLAGYL. SYMPTOMS DID RESOLVE. ASSOCIATED WITH FEVER/CHILLS. TWO ADDITIONAL NEGATIVE TESTS SINCE THEN AND WORRIED IT WAS COMING BACK. DR. Sherril Croon SENT HER TO [facility] + FLAGYL AFTER 4 DAYS(WHEEZING) STOPPED DIFICID AND FINISHED FLAGYL. SCHEDULED FOR TCS. NO PRIOR DX WITH IBS. TOOK OTC PROBIOTIC BUT YOGURT: EVERY DAY FOR PAST WEEK.  BOWEL NOW #4 WITH MUCOUS.  HAVING UPPER ABDOMINAL PAIN(DULL, MOST OF THE TIME, WORSE WHEN SHE HAS A BM, NO RADIATION0 AND LOWER ABDOMINAL PAIN(DULL, BETTER WITH BM) WHEN IT'S TIME TO GO TO BATHROOM. EXPERIENCES RECTAL URGENCY AND INCOMPLETE EMPTYING. MIRALAX MADE IT WORSE. BMs: MAY GO A DAY W/O A BM AND WHEN SHE DOES GO IT'S ALL DAY(#4 WITH MUCOUS, ALL DAY: 5 MINS FOR 6 TIMES).   NAUSEATED THEN AND NOW. NO TRAVEL. HAS WELL WATER(LAST TESTED???). STOPPED USING IBUPROFEN(HELPED) FOR PAIN RELIEF IN JUL BUT NOW USING TYLENOL). WANTS A SOLID STOLL WITHOUT ABDOMINAL PAIN/MUCOUS. FEELS DIZZY AND DOESN'T WANT TO EAT TOO MUCH BECAUSE SHE DOESN'T WANT [place] TO THE BATHROOM.  PT DENIES HEMATOCHEZIA, HEMATEMESIS, vomiting, melena, diarrhea, CHEST PAIN, SHORTNESS OF BREATH, problems swallowing, problems with sedation, OR heartburn or indigestion.  Past Medical History:  Diagnosis Date  . Body mass index 40.0-44.9, adult (HCC)   . Chest pain, unspecified   . Morbid obesity (HCC)   . Paroxysmal atrial fibrillation (HCC)   . Polycystic disease, ovaries   . Tobacco use disorder    Past Surgical History:  Procedure Laterality Date       .  WISDOM TOOTH EXTRACTION     Allergies  Allergen Reactions  . Amoxicillin Swelling  . Clindamycin/Lincomycin     "caused c diff"  . Dificid [Fidaxomicin]     SOB  . Heparin Other (See Comments)    Lowers BP Lowers blood pressure    Current Outpatient Medications  Medication Sig    . polyethylene glycol (MIRALAX / GLYCOLAX) packet Take 17 g by mouth daily as needed.    . Vitamin D, Ergocalciferol, (DRISDOL) 50000 UNITS CAPS capsule Take 1 capsule by mouth once a week. Takes on Thursday    . amoxicillin (AMOXIL) 500 MG capsule TAKE ONE CAPSULE BY MOUTH 4 TIMES A DAY UNTIL FINISHED    .      .      . ibuprofen (ADVIL,MOTRIN) 800 MG tablet Take 800 mg by mouth every 8 (eight) hours as needed for moderate pain.     Marland Kitchen letrozole (FEMARA) 2.5 MG tablet Take 2.5 mg by mouth as directed.    .      . metFORMIN (GLUCOPHAGE) 500 MG tablet Take 500 mg by mouth 2 (two) times daily with a meal.    .       Family History  Problem Relation Age of Onset  . Arrhythmia Mother 64       History of Cardiac Stents  . Hypertension Mother   . Diabetes Mother   . COPD Mother   . Colon polyps Mother   . Hypertension Father   . Diabetes Unknown  Family History  . Hypertension Unknown        Family History  . Diabetes Sister   . Hypertension Sister   . Colon cancer Maternal Uncle   . Crohn's disease Cousin     Social History   Socioeconomic History  . Marital status: Married    Spouse name: CHRISTOPHER   . Number of children: Not on file  . Years of education: Not on file  . Highest education level: Not on file  Occupational History  . Occupation: Research scientist (life sciences): [employer]  Social Needs  . Financial resource strain: Not on file  . Food insecurity:    Worry: Not on file    Inability: Not on file  . Transportation needs:    Medical: Not on file    Non-medical: Not on file  Tobacco Use  . Smoking status: Former Smoker    Packs/day: 1.00    Years: 12.00     Pack years: 12.00    Types: Cigarettes    Start date: 01/21/2004    Last attempt to quit: 09/10/2011    Years since quitting: 6.6  . Smokeless tobacco: Never Used  Substance and Sexual Activity  . Alcohol use: Never    Alcohol/week: 0.0 standard drinks    Frequency: Never  . Drug use: No  . Sexual activity: Yes  Lifestyle  . Physical activity:    Days per week: Not on file    Minutes per session: Not on file  . Stress: Not on file  Relationships  . Social connections:    Talks on phone: Not on file    Gets together: Not on file    Attends religious service: Not on file    Active member of club or organization: Not on file    Attends meetings of clubs or organizations: Not on file    Relationship status: Not on file  Other Topics Concern  . Not on file  Social History Narrative   Lives in [place], [place], [place] with husband.  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   Review of Systems PER HPI OTHERWISE ALL SYSTEMS ARE NEGATIVE. INTENTIONAL WEIGHT LOSS FROM 300 LBS TO 281 LBS TODAY.    Objective:   Physical Exam  Constitutional: She is oriented to person, place, and time. She appears well-developed and well-nourished. No distress.  HENT:  Head: Normocephalic and atraumatic.  Mouth/Throat: Oropharynx is clear and moist. No oropharyngeal exudate.  Eyes: Pupils are equal, round, and reactive to light. No scleral icterus.  Neck: Normal range of motion. Neck supple.  Cardiovascular: Normal rate, regular rhythm and normal heart sounds.  Pulmonary/Chest: Effort normal and breath sounds normal. No respiratory distress.  Abdominal: Soft. Bowel sounds are normal. She exhibits no distension. There is tenderness. There is no rebound and no guarding.  MILD TTP IN THE EPIGASTRIUM & BUQS.   Musculoskeletal: She exhibits no edema.  Lymphadenopathy:    She has no cervical adenopathy.  Neurological: She is alert and oriented to person, place, and time.    NO  NEW FOCAL DEFICITS  Psychiatric: She has a normal mood and affect.  Vitals reviewed.     Assessment & Plan:

## 2018-05-13 NOTE — Patient Instructions (Addendum)
DRINK WATER TO KEEP YOUR URINE LIGHT YELLOW.  FOLLOW A HIGH FIBER DIET. AVOID ITEMS THAT CAUSE BLOATING & GAS. SEE INFO BELOW.  TAKE A PROBIOTIC DAILY FOR SIX MONTHS.  USE FIBER GUMMIES, POWDER, OR PACKET ONCE OR TWICE DAILY. AVOID HIGHER DOSES IF IT CAUSES BLOATING & GAS.  TO PREVENT AND TREAT GASTRITIS DUE TO DAILY ASPIRIN USE, START PROTONIX. TAKE 30 MINUTES PRIOR TO BREAKFAST.  Please CALL or SEND me A MY CHART MESSAGE IF YOU HAVE QUESTIONS OR CONCERNS.  FOLLOW UP IN 6 WEEKS.  High-Fiber Diet A high-fiber diet changes your normal diet to include more whole grains, legumes, fruits, and vegetables. Changes in the diet involve replacing refined carbohydrates with unrefined foods. The calorie level of the diet is essentially unchanged. The Dietary Reference Intake (recommended amount) for adult males is 38 grams per day. For adult females, it is 25 grams per day. Pregnant and lactating women should consume 28 grams of fiber per day. Fiber is the intact part of a plant that is not broken down during digestion. Functional fiber is fiber that has been isolated from the plant to provide a beneficial effect in the body. PURPOSE  Increase stool bulk.   Ease and regulate bowel movements.   Lower cholesterol.   REDUCE RISK OF COLON CANCER  INDICATIONS THAT YOU NEED MORE FIBER  Constipation and hemorrhoids.   Uncomplicated diverticulosis (intestine condition) and irritable bowel syndrome.   Weight management.   As a protective measure against hardening of the arteries (atherosclerosis), diabetes, and cancer.   GUIDELINES FOR INCREASING FIBER IN THE DIET  Start adding fiber to the diet slowly. A gradual increase of about 5 more grams (2 slices of whole-wheat bread, 2 servings of most fruits or vegetables, or 1 bowl of high-fiber cereal) per day is best. Too rapid an increase in fiber may result in constipation, flatulence, and bloating.   Drink enough water and fluids to keep your urine  clear or pale yellow. Water, juice, or caffeine-free drinks are recommended. Not drinking enough fluid may cause constipation.   Eat a variety of high-fiber foods rather than one type of fiber.   Try to increase your intake of fiber through using high-fiber foods rather than fiber pills or supplements that contain small amounts of fiber.   The goal is to change the types of food eaten. Do not supplement your present diet with high-fiber foods, but replace foods in your present diet.   INCLUDE A VARIETY OF FIBER SOURCES  Replace refined and processed grains with whole grains, canned fruits with fresh fruits, and incorporate other fiber sources. White rice, white breads, and most bakery goods contain little or no fiber.   Brown whole-grain rice, buckwheat oats, and many fruits and vegetables are all good sources of fiber. These include: broccoli, Brussels sprouts, cabbage, cauliflower, beets, sweet potatoes, white potatoes (skin on), carrots, tomatoes, eggplant, squash, berries, fresh fruits, and dried fruits.   Cereals appear to be the richest source of fiber. Cereal fiber is found in whole grains and bran. Bran is the fiber-rich outer coat of cereal grain, which is largely removed in refining. In whole-grain cereals, the bran remains. In breakfast cereals, the largest amount of fiber is found in those with "bran" in their names. The fiber content is sometimes indicated on the label.   You may need to include additional fruits and vegetables each day.   In baking, for 1 cup white flour, you may use the following substitutions:  1 cup whole-wheat flour minus 2 tablespoons.   1/2 cup white flour plus 1/2 cup whole-wheat flour.

## 2018-05-13 NOTE — Progress Notes (Signed)
PATIENT SCHEDULED  °

## 2018-05-13 NOTE — Assessment & Plan Note (Signed)
SYMPTOMS NOT CONTROLLED AND MOST LIKELY DUE TO POST INFECTIOUS IBS/NSAID GASTRITIS, LESS LIKELY IBD OR MICROSCOPIC COLITIS.   DRINK WATER TO KEEP YOUR URINE LIGHT YELLOW. FOLLOW A HIGH FIBER DIET. AVOID ITEMS THAT CAUSE BLOATING & GAS.  HANDOUT GIVEN. TAKE A PROBIOTIC DAILY FOR SIX MONTHS. USE FIBER GUMMIES, POWDER, OR PACKET ONCE OR TWICE DAILY. AVOID HIGHER DOSES IF IT CAUSES BLOATING & GAS. TO PREVENT AND TREAT GASTRITIS DUE TO DAILY ASPIRIN USE, START PROTONIX. TAKE 30 MINUTES PRIOR TO BREAKFAST. Please CALL or SEND me A MY CHART MESSAGE IF YOU HAVE QUESTIONS OR CONCERNS.  FOLLOW UP IN 6 WEEKS.

## 2018-05-13 NOTE — Progress Notes (Signed)
cc'ed to pcp °

## 2018-05-19 ENCOUNTER — Encounter: Payer: Self-pay | Admitting: General Surgery

## 2018-05-19 ENCOUNTER — Ambulatory Visit (INDEPENDENT_AMBULATORY_CARE_PROVIDER_SITE_OTHER): Payer: BLUE CROSS/BLUE SHIELD | Admitting: General Surgery

## 2018-05-19 VITALS — BP 132/91 | HR 72 | Temp 97.5°F | Resp 16 | Wt 284.8 lb

## 2018-05-19 DIAGNOSIS — L732 Hidradenitis suppurativa: Secondary | ICD-10-CM | POA: Diagnosis not present

## 2018-05-19 NOTE — Patient Instructions (Signed)
Hidradenitis Suppurativa Hidradenitis suppurativa is a long-term (chronic) skin disease that starts with blocked sweat glands or hair follicles. Bacteria may grow in these blocked openings of your skin. Hidradenitis suppurativa is like a severe form of acne that develops in areas of your body where acne would be unusual. It is most likely to affect the areas of your body where skin rubs against skin and becomes moist. This includes your:  Underarms.  Groin.  Genital areas.  Buttocks.  Upper thighs.  Breasts.  Hidradenitis suppurativa may start out with small pimples. The pimples can develop into deep sores that break open (rupture) and drain pus. Over time your skin may thicken and become scarred. Hidradenitis suppurativa cannot be passed from person to person. What are the causes? The exact cause of hidradenitis suppurativa is not known. This condition may be due to:  Female and female hormones. The condition is rare before and after puberty.  An overactive body defense system (immune system). Your immune system may overreact to the blocked hair follicles or sweat glands and cause swelling and pus-filled sores.  What increases the risk? You may have a higher risk of hidradenitis suppurativa if you:  Are a woman.  Are between ages 11 and 55.  Have a family history of hidradenitis suppurativa.  Have a personal history of acne.  Are overweight.  Smoke.  Take the drug lithium.  What are the signs or symptoms? The first signs of an outbreak are usually painful skin bumps that look like pimples. As the condition progresses:  Skin bumps may get bigger and grow deeper into the skin.  Bumps under the skin may rupture and drain smelly pus.  Skin may become itchy and infected.  Skin may thicken and scar.  Drainage may continue through tunnels under the skin (fistulas).  Walking and moving your arms can become painful.  How is this diagnosed? Your health care provider may  diagnose hidradenitis suppurativa based on your medical history and your signs and symptoms. A physical exam will also be done. You may need to see a health care provider who specializes in skin diseases (dermatologist). You may also have tests done to confirm the diagnosis. These can include:  Swabbing a sample of pus or drainage from your skin so it can be sent to the lab and tested for infection.  Blood tests to check for infection.  How is this treated? The same treatment will not work for everybody with hidradenitis suppurativa. Your treatment will depend on how severe your symptoms are. You may need to try several treatments to find what works best for you. Part of your treatment may include cleaning and bandaging (dressing) your wounds. You may also have to take medicines, such as the following:  Antibiotics.  Acne medicines.  Medicines to block or suppress the immune system.  A diabetes medicine (metformin) is sometimes used to treat this condition.  For women, birth control pills can sometimes help relieve symptoms.  You may need surgery if you have a severe case of hidradenitis suppurativa that does not respond to medicine. Surgery may involve:  Using a laser to clear the skin and remove hair follicles.  Opening and draining deep sores.  Removing the areas of skin that are diseased and scarred.  Follow these instructions at home:  Learn as much as you can about your disease, and work closely with your health care providers.  Take medicines only as directed by your health care provider.  If you were prescribed   an antibiotic medicine, finish it all even if you start to feel better.  If you are overweight, losing weight may be very helpful. Try to reach and maintain a healthy weight.  Do not use any tobacco products, including cigarettes, chewing tobacco, or electronic cigarettes. If you need help quitting, ask your health care provider.  Do not shave the areas where you  get hidradenitis suppurativa.  Do not wear deodorant.  Wear loose-fitting clothes.  Try not to overheat and get sweaty.  Take a daily bleach bath as directed by your health care provider. ? Fill your bathtub halfway with water. ? Pour in  cup of unscented household bleach. ? Soak for 5-10 minutes.  Cover sore areas with a warm, clean washcloth (compress) for 5-10 minutes. Contact a health care provider if:  You have a flare-up of hidradenitis suppurativa.  You have chills or a fever.  You are having trouble controlling your symptoms at home. This information is not intended to replace advice given to you by your health care provider. Make sure you discuss any questions you have with your health care provider. Document Released: 02/13/2004 Document Revised: 12/07/2015 Document Reviewed: 10/01/2013 Elsevier Interactive Patient Education  2018 [employer]  

## 2018-05-19 NOTE — H&P (Signed)
[facility] History and Physical  Reason for Referral: Right armpit infection Referring Physician:  Dr. Colan Riddle is a 32 y.o. female.  HPI: Deborah Riddle is a very pleasant 33 yo who has a history of a right armpit infection that has been going on since about August 2019. She reports having a history of doing fertility treatments, and that she was taking a break from the hormone treatments when she noticed a swelling/ draining area in the right armpit. She denies ever having anything like this prior, and has not had infections of the other armpits/ breast/ groins.  She denies any family history of breast cancer or personal history of any lumps or bumps.    The area is currently not draining, but has been on and off when pressed. She took clindamycin for the infection and got C dif which she has had treated.  She has a history of Paroxysmal A fib that was thought to be secondary to caffeine, weight, and stress, and she was prescribed a pill in pocket plan. She is not currently in A fib and does not take any medications.       Past Medical History:  Diagnosis Date  . Body mass index 40.0-44.9, adult (HCC)   . Chest pain, unspecified   . Morbid obesity (HCC)   . Paroxysmal atrial fibrillation (HCC)   . Polycystic disease, ovaries   . Tobacco use disorder          Past Surgical History:  Procedure Laterality Date  . none    . WISDOM TOOTH EXTRACTION           Family History  Problem Relation Age of Onset  . Arrhythmia Mother 65       History of Cardiac Stents  . Hypertension Mother   . Diabetes Mother   . COPD Mother   . Colon polyps Mother   . Hypertension Father   . Diabetes Unknown        Family History  . Hypertension Unknown        Family History  . Diabetes Sister   . Hypertension Sister   . Colon cancer Maternal Uncle   . Crohn's disease Cousin     Social History        Tobacco Use  .  Smoking status: Former Smoker    Packs/day: 1.00    Years: 12.00    Pack years: 12.00    Types: Cigarettes    Start date: 01/21/2004    Last attempt to quit: 09/10/2011    Years since quitting: 6.6  . Smokeless tobacco: Never Used  Substance Use Topics  . Alcohol use: Never    Alcohol/week: 0.0 standard drinks    Frequency: Never  . Drug use: No    Medications: I have reviewed the patient's current medications.      Allergies as of 05/19/2018      Reactions   Amoxicillin Swelling   Clindamycin/lincomycin    "caused c diff"   Dificid [fidaxomicin]    SOB   Heparin Other (See Comments)   Lowers BP Lowers blood pressure               Medication List            Accurate as of 05/19/18 10:27 AM. Always use your most recent med list.           pantoprazole 40 MG tablet Commonly known as:  PROTONIX 1 po 30  mins prior to first meal   Vitamin D (Ergocalciferol) 50000 units Caps capsule Commonly known as:  DRISDOL Take 1 capsule by mouth once a week. Takes on Thursday        ROS:  A comprehensive review of systems was negative except for: Cardiovascular: positive for paroxysmal A fib managed with pill in pocket plan Gastrointestinal: positive for C dif, treated  Neurological: positive for dizziness right armpit swelling/ infection  Blood pressure (!) 132/91, pulse 72, temperature (!) 97.5 F (36.4 C), temperature source Temporal, resp. rate 16, weight 284 lb 12.8 oz (129.2 kg). Physical Exam  Constitutional: She is oriented to person, place, and time. She appears well-developed and well-nourished.  HENT:  Head: Normocephalic and atraumatic.  Eyes: Pupils are equal, round, and reactive to light. EOM are normal.  Neck: Normal range of motion.  Cardiovascular: Normal rate and regular rhythm.  Pulmonary/Chest: Effort normal and breath sounds normal.  Right axilla with cord like scarring, no drainage or erythema, superficial  in nature, measuring about 1X4cm   Abdominal: Soft. She exhibits no distension. There is no tenderness.  Musculoskeletal: Normal range of motion. She exhibits no edema.  Neurological: She is alert and oriented to person, place, and time.  Skin: Skin is warm and dry.  Psychiatric: She has a normal mood and affect. Her behavior is normal. Judgment and thought content normal.  Vitals reviewed.   Results: None   Assessment & Plan:  Deborah Riddle is a 32 y.o. female with what appears to be hidradenitis type condition in the right arm pit. We discussed the spectrum of disease of hidradenitis and the treatment with antibiotics/ immune suppression therapy in patients with extensive disease. Ms. Crance has a very limited area that is now scarred in and has formed a cord. It does drain intermittently, so it likely has some degree of indolent infection.  Given her worry and concern and difficulty with antibiotics (C dif), it is reasonable to excise this area to limit further infections.   -Excision of skin of right armpit/ hidradenitis  -Plan for Friday excision to allow for a few days off of work   All questions were answered to the satisfaction of the patient.  The risk and benefits of excision of right armpit skin/ hidradenitis  were discussed including but not limited to bleeding, infection, risk of needing to pack the area, risk of recurrence.  After careful consideration, Deborah Riddle has decided to proceed.   Deborah Riddle 05/19/2018, 10:27 AM

## 2018-05-19 NOTE — Progress Notes (Addendum)
[facility] History and Physical  Reason for Referral: Right armpit infection Referring Physician:  Dr. Colan Neptune is a 32 y.o. female.  HPI: Ms. Deborah Riddle is a very pleasant 32 yo who has a history of a right armpit infection that has been going on since about August 2019. She reports having a history of doing fertility treatments, and that she was taking a break from the hormone treatments when she noticed a swelling/ draining area in the right armpit. She denies ever having anything like this prior, and has not had infections of the other armpits/ breast/ groins.  She denies any family history of breast cancer or personal history of any lumps or bumps.    The area is currently not draining, but has been on and off when pressed. She took clindamycin for the infection and got C dif which she has had treated.  She has a history of Paroxysmal A fib that was thought to be secondary to caffeine, weight, and stress, and she was prescribed a pill in pocket plan. She is not currently in A fib and does not take any medications.   Past Medical History:  Diagnosis Date  . Body mass index 40.0-44.9, adult (HCC)   . Chest pain, unspecified   . Morbid obesity (HCC)   . Paroxysmal atrial fibrillation (HCC)   . Polycystic disease, ovaries   . Tobacco use disorder     Past Surgical History:  Procedure Laterality Date  . none    . WISDOM TOOTH EXTRACTION      Family History  Problem Relation Age of Onset  . Arrhythmia Mother 84       History of Cardiac Stents  . Hypertension Mother   . Diabetes Mother   . COPD Mother   . Colon polyps Mother   . Hypertension Father   . Diabetes Unknown        Family History  . Hypertension Unknown        Family History  . Diabetes Sister   . Hypertension Sister   . Colon cancer Maternal Uncle   . Crohn's disease Cousin     Social History   Tobacco Use  . Smoking status: Former Smoker    Packs/day: 1.00    Years:  12.00    Pack years: 12.00    Types: Cigarettes    Start date: 01/21/2004    Last attempt to quit: 09/10/2011    Years since quitting: 6.6  . Smokeless tobacco: Never Used  Substance Use Topics  . Alcohol use: Never    Alcohol/week: 0.0 standard drinks    Frequency: Never  . Drug use: No    Medications: I have reviewed the patient's current medications. Allergies as of 05/19/2018      Reactions   Amoxicillin Swelling   Clindamycin/lincomycin    "caused c diff"   Dificid [fidaxomicin]    SOB   Heparin Other (See Comments)   Lowers BP Lowers blood pressure      Medication List        Accurate as of 05/19/18 10:27 AM. Always use your most recent med list.          pantoprazole 40 MG tablet Commonly known as:  PROTONIX 1 po 30 mins prior to first meal   Vitamin D (Ergocalciferol) 50000 units Caps capsule Commonly known as:  DRISDOL Take 1 capsule by mouth once a week. Takes on Thursday  ROS:  A comprehensive review of systems was negative except for: Cardiovascular: positive for paroxysmal A fib managed with pill in pocket plan Gastrointestinal: positive for C dif, treated  Neurological: positive for dizziness right armpit swelling/ infection  Blood pressure (!) 132/91, pulse 72, temperature (!) 97.5 F (36.4 C), temperature source Temporal, resp. rate 16, weight 284 lb 12.8 oz (129.2 kg). Physical Exam  Constitutional: She is oriented to person, place, and time. She appears well-developed and well-nourished.  HENT:  Head: Normocephalic and atraumatic.  Eyes: Pupils are equal, round, and reactive to light. EOM are normal.  Neck: Normal range of motion.  Cardiovascular: Normal rate and regular rhythm.  Pulmonary/Chest: Effort normal and breath sounds normal.  Right axilla with cord like scarring, no drainage or erythema, superficial in nature, measuring about 1X4cm   Abdominal: Soft. She exhibits no distension. There is no tenderness.  Musculoskeletal:  Normal range of motion. She exhibits no edema.  Neurological: She is alert and oriented to person, place, and time.  Skin: Skin is warm and dry.  Psychiatric: She has a normal mood and affect. Her behavior is normal. Judgment and thought content normal.  Vitals reviewed.   Results: None   Assessment & Plan:  CECILEE ROSNER is a 32 y.o. female with what appears to be hidradenitis type condition in the right arm pit. We discussed the spectrum of disease of hidradenitis and the treatment with antibiotics/ immune suppression therapy in patients with extensive disease. Ms. Lannom has a very limited area that is now scarred in and has formed a cord. It does drain intermittently, so it likely has some degree of indolent infection.  Given her worry and concern and difficulty with antibiotics (C dif), it is reasonable to excise this area to limit further infections.   -Excision of skin of right armpit/ hidradenitis  -Plan for Friday excision to allow for a few days off of work  -Preop appt required for surgery will be on 11/13 -Surgery on 11/15  All questions were answered to the satisfaction of the patient.  The risk and benefits of excision of right armpit skin/ hidradenitis  were discussed including but not limited to bleeding, infection, risk of needing to pack the area, risk of recurrence.  After careful consideration, REN GRASSE has decided to proceed.   Lucretia Roers 05/19/2018, 10:27 AM

## 2018-05-21 NOTE — Patient Instructions (Signed)
Deborah Riddle  05/21/2018     @PREFPERIOPPHARMACY @   Your procedure is scheduled on  05/29/2018.  Report to [facility] at  615   A.M.  Call this number if you have problems the morning of surgery:  [phone]   Remember:  Do not eat or drink after midnight.                          Take these medicines the morning of surgery with A SIP OF WATER  Protonix.    Do not wear jewelry, make-up or nail polish.  Do not wear lotions, powders, or perfumes, or deodorant.  Do not shave 48 hours prior to surgery.  Men may shave face and neck.  Do not bring valuables to the hospital.  [facility] is not responsible for any belongings or valuables.  Contacts, dentures or bridgework may not be worn into surgery.  Leave your suitcase in the car.  After surgery it may be brought to your room.  For patients admitted to the hospital, discharge time will be determined by your treatment team.  Patients discharged the day of surgery will not be allowed to drive home.   Name and phone number of your driver:   family Special instructions:  None  Please read over the following fact sheets that you were given. Anesthesia Post-op Instructions and Care and Recovery After Surgery       Incision Care, Adult An incision is a cut that a doctor makes in your skin for surgery (for a procedure). Most times, these cuts are closed after surgery. Your cut from surgery may be closed with stitches (sutures), staples, skin glue, or skin tape (adhesive strips). You may need to return to your doctor to have stitches or staples taken out. This may happen many days or many weeks after your surgery. The cut needs to be well cared for so it does not get infected. How to care for your cut Cut care  Follow instructions from your doctor about how to take care of your cut. Make sure you: ? Wash your hands with soap and water before you change your bandage (dressing). If you cannot use soap and  water, use hand sanitizer. ? Change your bandage as told by your doctor. ? Leave stitches, skin glue, or skin tape in place. They may need to stay in place for 2 weeks or longer. If tape strips get loose and curl up, you may trim the loose edges. Do not remove tape strips completely unless your doctor says it is okay.  Check your cut area every day for signs of infection. Check for: ? More redness, swelling, or pain. ? More fluid or blood. ? Warmth. ? Pus or a bad smell.  Ask your doctor how to clean the cut. This may include: ? Using mild soap and water. ? Using a clean towel to pat the cut dry after you clean it. ? Putting a cream or ointment on the cut. Do this only as told by your doctor. ? Covering the cut with a clean bandage.  Ask your doctor when you can leave the cut uncovered.  Do not take baths, swim, or use a hot tub until your doctor says it is okay. Ask your doctor if you can take showers. You may only be allowed to take sponge baths for bathing. Medicines  If you were prescribed an antibiotic  medicine, cream, or ointment, take the antibiotic or put it on the cut as told by your doctor. Do not stop taking or putting on the antibiotic even if your condition gets better.  Take over-the-counter and prescription medicines only as told by your doctor. General instructions  Limit movement around your cut. This helps healing. ? Avoid straining, lifting, or exercise for the first month, or for as long as told by your doctor. ? Follow instructions from your doctor about going back to your normal activities. ? Ask your doctor what activities are safe.  Protect your cut from the sun when you are outside for the first 6 months, or for as long as told by your doctor. Put on sunscreen around the scar or cover up the scar.  Keep all follow-up visits as told by your doctor. This is important. Contact a doctor if:  Your have more redness, swelling, or pain around the cut.  You have  more fluid or blood coming from the cut.  Your cut feels warm to the touch.  You have pus or a bad smell coming from the cut.  You have a fever or shaking chills.  You feel sick to your stomach (nauseous) or you throw up (vomit).  You are dizzy.  Your stitches or staples come undone. Get help right away if:  You have a red streak coming from your cut.  Your cut bleeds through the bandage and the bleeding does not stop with gentle pressure.  The edges of your cut open up and separate.  You have very bad (severe) pain.  You have a rash.  You are confused.  You pass out (faint).  You have trouble breathing and you have a fast heartbeat. This information is not intended to replace advice given to you by your health care provider. Make sure you discuss any questions you have with your health care provider. Document Released: 09/23/2011 Document Revised: 03/08/2016 Document Reviewed: 03/08/2016 Elsevier Interactive Patient Education  2017 [employer] Hidradenitis Suppurativa Hidradenitis suppurativa is a long-term (chronic) skin disease that starts with blocked sweat glands or hair follicles. Bacteria may grow in these blocked openings of your skin. Hidradenitis suppurativa is like a severe form of acne that develops in areas of your body where acne would be unusual. It is most likely to affect the areas of your body where skin rubs against skin and becomes moist. This includes your:  Underarms.  Groin.  Genital areas.  Buttocks.  Upper thighs.  Breasts.  Hidradenitis suppurativa may start out with small pimples. The pimples can develop into deep sores that break open (rupture) and drain pus. Over time your skin may thicken and become scarred. Hidradenitis suppurativa cannot be passed from person to person. What are the causes? The exact cause of hidradenitis suppurativa is not known. This condition may be due to:  Female and female hormones. The condition is rare  before and after puberty.  An overactive body defense system (immune system). Your immune system may overreact to the blocked hair follicles or sweat glands and cause swelling and pus-filled sores.  What increases the risk? You may have a higher risk of hidradenitis suppurativa if you:  Are a woman.  Are between ages 18 and 35.  Have a family history of hidradenitis suppurativa.  Have a personal history of acne.  Are overweight.  Smoke.  Take the drug lithium.  What are the signs or symptoms? The first signs of an outbreak are usually painful skin bumps  that look like pimples. As the condition progresses:  Skin bumps may get bigger and grow deeper into the skin.  Bumps under the skin may rupture and drain smelly pus.  Skin may become itchy and infected.  Skin may thicken and scar.  Drainage may continue through tunnels under the skin (fistulas).  Walking and moving your arms can become painful.  How is this diagnosed? Your health care provider may diagnose hidradenitis suppurativa based on your medical history and your signs and symptoms. A physical exam will also be done. You may need to see a health care provider who specializes in skin diseases (dermatologist). You may also have tests done to confirm the diagnosis. These can include:  Swabbing a sample of pus or drainage from your skin so it can be sent to the lab and tested for infection.  Blood tests to check for infection.  How is this treated? The same treatment will not work for everybody with hidradenitis suppurativa. Your treatment will depend on how severe your symptoms are. You may need to try several treatments to find what works best for you. Part of your treatment may include cleaning and bandaging (dressing) your wounds. You may also have to take medicines, such as the following:  Antibiotics.  Acne medicines.  Medicines to block or suppress the immune system.  A diabetes medicine (metformin) is  sometimes used to treat this condition.  For women, birth control pills can sometimes help relieve symptoms.  You may need surgery if you have a severe case of hidradenitis suppurativa that does not respond to medicine. Surgery may involve:  Using a laser to clear the skin and remove hair follicles.  Opening and draining deep sores.  Removing the areas of skin that are diseased and scarred.  Follow these instructions at home:  Learn as much as you can about your disease, and work closely with your health care providers.  Take medicines only as directed by your health care provider.  If you were prescribed an antibiotic medicine, finish it all even if you start to feel better.  If you are overweight, losing weight may be very helpful. Try to reach and maintain a healthy weight.  Do not use any tobacco products, including cigarettes, chewing tobacco, or electronic cigarettes. If you need help quitting, ask your health care provider.  Do not shave the areas where you get hidradenitis suppurativa.  Do not wear deodorant.  Wear loose-fitting clothes.  Try not to overheat and get sweaty.  Take a daily bleach bath as directed by your health care provider. ? Fill your bathtub halfway with water. ? Pour in  cup of unscented household bleach. ? Soak for 5-10 minutes.  Cover sore areas with a warm, clean washcloth (compress) for 5-10 minutes. Contact a health care provider if:  You have a flare-up of hidradenitis suppurativa.  You have chills or a fever.  You are having trouble controlling your symptoms at home. This information is not intended to replace advice given to you by your health care provider. Make sure you discuss any questions you have with your health care provider. Document Released: 02/13/2004 Document Revised: 12/07/2015 Document Reviewed: 10/01/2013 Elsevier Interactive Patient Education  2018 [employer].  General Anesthesia, Adult General anesthesia is  the use of medicines to make a person "go to sleep" (be unconscious) for a medical procedure. General anesthesia is often recommended when a procedure:  Is long.  Requires you to be still or in an unusual position.  Is major and can cause you to lose blood.  Is impossible to do without general anesthesia.  The medicines used for general anesthesia are called general anesthetics. In addition to making you sleep, the medicines:  Prevent pain.  Control your blood pressure.  Relax your muscles.  Tell a health care provider about:  Any allergies you have.  All medicines you are taking, including vitamins, herbs, eye drops, creams, and over-the-counter medicines.  Any problems you or family members have had with anesthetic medicines.  Types of anesthetics you have had in the past.  Any bleeding disorders you have.  Any surgeries you have had.  Any medical conditions you have.  Any history of heart or lung conditions, such as heart failure, sleep apnea, or chronic obstructive pulmonary disease (COPD).  Whether you are pregnant or may be pregnant.  Whether you use tobacco, alcohol, marijuana, or street drugs.  Any history of Financial planner.  Any history of depression or anxiety. What are the risks? Generally, this is a safe procedure. However, problems may occur, including:  Allergic reaction to anesthetics.  Lung and heart problems.  Inhaling food or liquids from your stomach into your lungs (aspiration).  Injury to nerves.  Waking up during your procedure and being unable to move (rare).  Extreme agitation or a state of mental confusion (delirium) when you wake up from the anesthetic.  Air in the bloodstream, which can lead to stroke.  These problems are more likely to develop if you are having a major surgery or if you have an advanced medical condition. You can prevent some of these complications by answering all of your health care provider's questions  thoroughly and by following all pre-procedure instructions. General anesthesia can cause side effects, including:  Nausea or vomiting  A sore throat from the breathing tube.  Feeling cold or shivery.  Feeling tired, washed out, or achy.  Sleepiness or drowsiness.  Confusion or agitation.  What happens before the procedure? Staying hydrated Follow instructions from your health care provider about hydration, which may include:  Up to 2 hours before the procedure - you may continue to drink clear liquids, such as water, clear fruit juice, black coffee, and plain tea.  Eating and drinking restrictions Follow instructions from your health care provider about eating and drinking, which may include:  8 hours before the procedure - stop eating heavy meals or foods such as meat, fried foods, or fatty foods.  6 hours before the procedure - stop eating light meals or foods, such as toast or cereal.  6 hours before the procedure - stop drinking milk or drinks that contain milk.  2 hours before the procedure - stop drinking clear liquids.  Medicines  Ask your health care provider about: ? Changing or stopping your regular medicines. This is especially important if you are taking diabetes medicines or blood thinners. ? Taking medicines such as aspirin and ibuprofen. These medicines can thin your blood. Do not take these medicines before your procedure if your health care provider instructs you not to. ? Taking new dietary supplements or medicines. Do not take these during the week before your procedure unless your health care provider approves them.  If you are told to take a medicine or to continue taking a medicine on the day of the procedure, take the medicine with sips of water. General instructions   Ask if you will be going home the same day, the following day, or after a longer hospital stay. ?  Plan to have someone take you home. ? Plan to have someone stay with you for the  first 24 hours after you leave the hospital or clinic.  For 3-6 weeks before the procedure, try not to use any tobacco products, such as cigarettes, chewing tobacco, and e-cigarettes.  You may brush your teeth on the morning of the procedure, but make sure to spit out the toothpaste. What happens during the procedure?  You will be given anesthetics through a mask and through an IV tube in one of your veins.  You may receive medicine to help you relax (sedative).  As soon as you are asleep, a breathing tube may be used to help you breathe.  An anesthesia specialist will stay with you throughout the procedure. He or she will help keep you comfortable and safe by continuing to give you medicines and adjusting the amount of medicine that you get. He or she will also watch your blood pressure, pulse, and oxygen levels to make sure that the anesthetics do not cause any problems.  If a breathing tube was used to help you breathe, it will be removed before you wake up. The procedure may vary among health care providers and hospitals. What happens after the procedure?  You will wake up, often slowly, after the procedure is complete, usually in a recovery area.  Your blood pressure, heart rate, breathing rate, and blood oxygen level will be monitored until the medicines you were given have worn off.  You may be given medicine to help you calm down if you feel anxious or agitated.  If you will be going home the same day, your health care provider may check to make sure you can stand, drink, and urinate.  Your health care providers will treat your pain and side effects before you go home.  Do not drive for 24 hours if you received a sedative.  You may: ? Feel nauseous and vomit. ? Have a sore throat. ? Have mental slowness. ? Feel cold or shivery. ? Feel sleepy. ? Feel tired. ? Feel sore or achy, even in parts of your body where you did not have surgery. This information is not intended to  replace advice given to you by your health care provider. Make sure you discuss any questions you have with your health care provider. Document Released: 10/08/2007 Document Revised: 12/12/2015 Document Reviewed: 06/15/2015 Elsevier Interactive Patient Education  2018 [employer]. General Anesthesia, Adult, Care After These instructions provide you with information about caring for yourself after your procedure. Your health care provider may also give you more specific instructions. Your treatment has been planned according to current medical practices, but problems sometimes occur. Call your health care provider if you have any problems or questions after your procedure. What can I expect after the procedure? After the procedure, it is common to have:  Vomiting.  A sore throat.  Mental slowness.  It is common to feel:  Nauseous.  Cold or shivery.  Sleepy.  Tired.  Sore or achy, even in parts of your body where you did not have surgery.  Follow these instructions at home: For at least 24 hours after the procedure:  Do not: ? Participate in activities where you could fall or become injured. ? Drive. ? Use heavy machinery. ? Drink alcohol. ? Take sleeping pills or medicines that cause drowsiness. ? Make important decisions or sign legal documents. ? Take care of children on your own.  Rest. Eating and drinking  If  you vomit, drink water, juice, or soup when you can drink without vomiting.  Drink enough fluid to keep your urine clear or pale yellow.  Make sure you have little or no nausea before eating solid foods.  Follow the diet recommended by your health care provider. General instructions  Have a responsible adult stay with you until you are awake and alert.  Return to your normal activities as told by your health care provider. Ask your health care provider what activities are safe for you.  Take over-the-counter and prescription medicines only as told by  your health care provider.  If you smoke, do not smoke without supervision.  Keep all follow-up visits as told by your health care provider. This is important. Contact a health care provider if:  You continue to have nausea or vomiting at home, and medicines are not helpful.  You cannot drink fluids or start eating again.  You cannot urinate after 8-12 hours.  You develop a skin rash.  You have fever.  You have increasing redness at the site of your procedure. Get help right away if:  You have difficulty breathing.  You have chest pain.  You have unexpected bleeding.  You feel that you are having a life-threatening or urgent problem. This information is not intended to replace advice given to you by your health care provider. Make sure you discuss any questions you have with your health care provider. Document Released: 10/07/2000 Document Revised: 12/04/2015 Document Reviewed: 06/15/2015 Elsevier Interactive Patient Education  [employer].

## 2018-05-26 ENCOUNTER — Telehealth: Payer: Self-pay | Admitting: Gastroenterology

## 2018-05-26 NOTE — Telephone Encounter (Signed)
Pt said the previous GI doctor did FMLA for her because of her ongoing C-Diff.  She just needs to be covered by her job for appts due to her GI problems. This needs to be done by each physician she has for her job.

## 2018-05-26 NOTE — Telephone Encounter (Signed)
PLEASE CALL PT. Ask her why she needs FMLA.

## 2018-05-26 NOTE — Telephone Encounter (Signed)
Pt faxed over her FMLA papers and I have placed them in [facility] office. (orange folder)

## 2018-05-27 ENCOUNTER — Other Ambulatory Visit: Payer: Self-pay

## 2018-05-27 ENCOUNTER — Encounter (HOSPITAL_COMMUNITY)
Admission: RE | Admit: 2018-05-27 | Discharge: 2018-05-27 | Disposition: A | Discharge status: Home / Self Care | Payer: BLUE CROSS/BLUE SHIELD | Source: Ambulatory Visit | Attending: General Surgery | Admitting: General Surgery

## 2018-05-27 ENCOUNTER — Encounter (HOSPITAL_COMMUNITY): Payer: Self-pay

## 2018-05-27 DIAGNOSIS — Z01818 Encounter for other preprocedural examination: Secondary | ICD-10-CM | POA: Diagnosis not present

## 2018-05-27 HISTORY — DX: Gastro-esophageal reflux disease without esophagitis: K21.9

## 2018-05-27 HISTORY — DX: Cardiac arrhythmia, unspecified: I49.9

## 2018-05-27 HISTORY — DX: Sickle-cell trait: D57.3

## 2018-05-27 LAB — CBC WITH DIFFERENTIAL/PLATELET
ABS IMMATURE GRANULOCYTES: 0.03 10*3/uL (ref 0.00–0.07)
BASOS PCT: 1 %
Basophils Absolute: 0.1 10*3/uL (ref 0.0–0.1)
Eosinophils Absolute: 0.1 10*3/uL (ref 0.0–0.5)
Eosinophils Relative: 1 %
HEMATOCRIT: 39.4 % (ref 36.0–46.0)
HEMOGLOBIN: 12.9 g/dL (ref 12.0–15.0)
IMMATURE GRANULOCYTES: 0 %
LYMPHS ABS: 2.7 10*3/uL (ref 0.7–4.0)
LYMPHS PCT: 32 %
MCH: 24.7 pg — ABNORMAL LOW (ref 26.0–34.0)
MCHC: 32.7 g/dL (ref 30.0–36.0)
MCV: 75.3 fL — AB (ref 80.0–100.0)
MONOS PCT: 6 %
Monocytes Absolute: 0.5 10*3/uL (ref 0.1–1.0)
NEUTROS ABS: 5.1 10*3/uL (ref 1.7–7.7)
Neutrophils Relative %: 60 %
PLATELETS: 199 10*3/uL (ref 150–400)
RBC: 5.23 MIL/uL — ABNORMAL HIGH (ref 3.87–5.11)
RDW: 15.6 % — ABNORMAL HIGH (ref 11.5–15.5)
WBC: 8.6 10*3/uL (ref 4.0–10.5)
nRBC: 0 % (ref 0.0–0.2)

## 2018-05-27 LAB — BASIC METABOLIC PANEL
ANION GAP: 5 (ref 5–15)
BUN: 11 mg/dL (ref 6–20)
CHLORIDE: 106 mmol/L (ref 98–111)
CO2: 27 mmol/L (ref 22–32)
Calcium: 8.9 mg/dL (ref 8.9–10.3)
Creatinine, Ser: 0.91 mg/dL (ref 0.44–1.00)
GFR calc Af Amer: >60 mL/min (ref >60)
GLUCOSE: 95 mg/dL (ref 70–99)
POTASSIUM: 3.3 mmol/L — AB (ref 3.5–5.1)
Sodium: 138 mmol/L (ref 135–145)

## 2018-05-27 LAB — HCG, SERUM, QUALITATIVE: Preg, Serum: POSITIVE — AB

## 2018-05-27 NOTE — Telephone Encounter (Signed)
Code entered, copies made and I will call patient in the morning to let her know they are ready and there's a $29 fee

## 2018-05-27 NOTE — Telephone Encounter (Signed)
Deborah PikesSusan has paperwork. She will notify pt so pt can come by here and pay the fees.

## 2018-05-27 NOTE — Telephone Encounter (Signed)
FMLA PAPERWORK COMPLETE. 

## 2018-05-28 ENCOUNTER — Encounter (HOSPITAL_COMMUNITY): Payer: Self-pay

## 2018-05-28 NOTE — Pre-Procedure Instructions (Signed)
Lab reports a weak positive pregnancy test. They suggest a repeat on 05/29/2018. Dr Henreitta LeberBridges aware.

## 2018-05-29 ENCOUNTER — Encounter (HOSPITAL_COMMUNITY): Payer: Self-pay

## 2018-05-29 ENCOUNTER — Ambulatory Visit (HOSPITAL_COMMUNITY): Payer: BLUE CROSS/BLUE SHIELD | Admitting: Anesthesiology

## 2018-05-29 ENCOUNTER — Encounter (HOSPITAL_COMMUNITY)
Admission: RE | Disposition: A | Discharge status: Home / Self Care | Payer: Self-pay | Source: Ambulatory Visit | Attending: General Surgery

## 2018-05-29 ENCOUNTER — Ambulatory Visit (HOSPITAL_COMMUNITY)
Admission: RE | Admit: 2018-05-29 | Discharge: 2018-05-29 | Disposition: A | Discharge status: Home / Self Care | Payer: BLUE CROSS/BLUE SHIELD | Source: Ambulatory Visit | Attending: General Surgery | Admitting: General Surgery

## 2018-05-29 DIAGNOSIS — Z6841 Body Mass Index (BMI) 40.0 and over, adult: Secondary | ICD-10-CM | POA: Insufficient documentation

## 2018-05-29 DIAGNOSIS — E282 Polycystic ovarian syndrome: Secondary | ICD-10-CM | POA: Diagnosis not present

## 2018-05-29 DIAGNOSIS — Z87891 Personal history of nicotine dependence: Secondary | ICD-10-CM | POA: Diagnosis not present

## 2018-05-29 DIAGNOSIS — I480 Paroxysmal atrial fibrillation: Secondary | ICD-10-CM | POA: Insufficient documentation

## 2018-05-29 DIAGNOSIS — L732 Hidradenitis suppurativa: Secondary | ICD-10-CM | POA: Insufficient documentation

## 2018-05-29 DIAGNOSIS — Z79899 Other long term (current) drug therapy: Secondary | ICD-10-CM | POA: Insufficient documentation

## 2018-05-29 DIAGNOSIS — Z881 Allergy status to other antibiotic agents status: Secondary | ICD-10-CM | POA: Insufficient documentation

## 2018-05-29 HISTORY — PX: HYDRADENITIS EXCISION: SHX5243

## 2018-05-29 LAB — HCG, QUANTITATIVE, PREGNANCY

## 2018-05-29 SURGERY — EXCISION, HIDRADENITIS, AXILLA
Anesthesia: General | Site: Axilla | Laterality: Right

## 2018-05-29 MED ORDER — VANCOMYCIN HCL IN DEXTROSE 1-5 GM/200ML-% IV SOLN
1000.0000 mg | INTRAVENOUS | Status: AC
  Administered 2018-05-29: 1000 mg via INTRAVENOUS
  Filled 2018-05-29: qty 200

## 2018-05-29 MED ORDER — PROPOFOL 10 MG/ML IV BOLUS
INTRAVENOUS | Status: AC
  Filled 2018-05-29: qty 40

## 2018-05-29 MED ORDER — HYDROMORPHONE HCL 1 MG/ML IJ SOLN: 0.2500 mg | INTRAMUSCULAR | Status: DC | PRN

## 2018-05-29 MED ORDER — FENTANYL CITRATE (PF) 100 MCG/2ML IJ SOLN
INTRAMUSCULAR | Status: AC
  Filled 2018-05-29: qty 4

## 2018-05-29 MED ORDER — OXYCODONE HCL 5 MG PO TABS: 5.0000 mg | ORAL_TABLET | ORAL | 0 refills | Status: DC | PRN

## 2018-05-29 MED ORDER — LACTATED RINGERS IV SOLN
INTRAVENOUS | Status: DC
  Administered 2018-05-29: 07:00:00 via INTRAVENOUS

## 2018-05-29 MED ORDER — HYDROCODONE-ACETAMINOPHEN 7.5-325 MG PO TABS
1.0000 | ORAL_TABLET | Freq: Once | ORAL | Status: AC | PRN
  Administered 2018-05-29: 1 via ORAL
  Filled 2018-05-29: qty 1

## 2018-05-29 MED ORDER — BUPIVACAINE HCL (PF) 0.5 % IJ SOLN
INTRAMUSCULAR | Status: DC | PRN
  Administered 2018-05-29: 20 mL

## 2018-05-29 MED ORDER — MIDAZOLAM HCL 5 MG/5ML IJ SOLN
INTRAMUSCULAR | Status: DC | PRN
  Administered 2018-05-29: 2 mg via INTRAVENOUS

## 2018-05-29 MED ORDER — PROPOFOL 10 MG/ML IV BOLUS
INTRAVENOUS | Status: DC | PRN
  Administered 2018-05-29: 200 mg via INTRAVENOUS

## 2018-05-29 MED ORDER — BUPIVACAINE HCL (PF) 0.5 % IJ SOLN
INTRAMUSCULAR | Status: AC
  Filled 2018-05-29: qty 30

## 2018-05-29 MED ORDER — 0.9 % SODIUM CHLORIDE (POUR BTL) OPTIME
TOPICAL | Status: DC | PRN
  Administered 2018-05-29: 1000 mL

## 2018-05-29 MED ORDER — PROMETHAZINE HCL 25 MG/ML IJ SOLN: 6.2500 mg | INTRAMUSCULAR | Status: DC | PRN

## 2018-05-29 MED ORDER — DOCUSATE SODIUM 100 MG PO CAPS: 100.0000 mg | ORAL_CAPSULE | Freq: Two times a day (BID) | ORAL | 2 refills | Status: DC

## 2018-05-29 MED ORDER — FENTANYL CITRATE (PF) 100 MCG/2ML IJ SOLN
INTRAMUSCULAR | Status: DC | PRN
  Administered 2018-05-29 (×2): 50 ug via INTRAVENOUS

## 2018-05-29 MED ORDER — DEXTROSE 5 % IV SOLN
INTRAVENOUS | Status: DC | PRN
  Administered 2018-05-29: 07:00:00 via INTRAVENOUS

## 2018-05-29 MED ORDER — MEPERIDINE HCL 50 MG/ML IJ SOLN: 6.2500 mg | INTRAMUSCULAR | Status: DC | PRN

## 2018-05-29 MED ORDER — ONDANSETRON HCL 4 MG/2ML IJ SOLN
INTRAMUSCULAR | Status: AC
  Filled 2018-05-29: qty 2

## 2018-05-29 MED ORDER — MIDAZOLAM HCL 2 MG/2ML IJ SOLN
INTRAMUSCULAR | Status: AC
  Filled 2018-05-29: qty 2

## 2018-05-29 MED ORDER — CHLORHEXIDINE GLUCONATE CLOTH 2 % EX PADS: 6.0000 | MEDICATED_PAD | Freq: Once | CUTANEOUS | Status: DC

## 2018-05-29 MED ORDER — ONDANSETRON HCL 4 MG/2ML IJ SOLN
INTRAMUSCULAR | Status: DC | PRN
  Administered 2018-05-29: 4 mg via INTRAVENOUS

## 2018-05-29 MED ORDER — LACTATED RINGERS IV SOLN: INTRAVENOUS | Status: DC

## 2018-05-29 SURGICAL SUPPLY — 32 items
CHLORAPREP W/TINT 26ML (MISCELLANEOUS) ×3 IMPLANT
CLOTH BEACON ORANGE TIMEOUT ST (SAFETY) ×3 IMPLANT
COVER LIGHT HANDLE STERIS (MISCELLANEOUS) ×6 IMPLANT
DECANTER SPIKE VIAL GLASS SM (MISCELLANEOUS) ×3 IMPLANT
ELECT REM PT RETURN 9FT ADLT (ELECTROSURGICAL) ×3
ELECTRODE REM PT RTRN 9FT ADLT (ELECTROSURGICAL) ×1 IMPLANT
GLOVE BIO SURGEON STRL SZ 6.5 (GLOVE) ×2 IMPLANT
GLOVE BIO SURGEON STRL SZ7 (GLOVE) ×3 IMPLANT
GLOVE BIO SURGEONS STRL SZ 6.5 (GLOVE) ×1
GLOVE BIOGEL PI IND STRL 6.5 (GLOVE) ×1 IMPLANT
GLOVE BIOGEL PI IND STRL 7.0 (GLOVE) ×2 IMPLANT
GLOVE BIOGEL PI INDICATOR 6.5 (GLOVE) ×2
GLOVE BIOGEL PI INDICATOR 7.0 (GLOVE) ×4
GLOVE SURG SS PI 7.5 STRL IVOR (GLOVE) ×6 IMPLANT
GOWN STRL REUS W/ TWL LRG LVL3 (GOWN DISPOSABLE) ×1 IMPLANT
GOWN STRL REUS W/ TWL XL LVL3 (GOWN DISPOSABLE) ×1 IMPLANT
GOWN STRL REUS W/TWL LRG LVL3 (GOWN DISPOSABLE) ×2
GOWN STRL REUS W/TWL XL LVL3 (GOWN DISPOSABLE) ×2
INST SET MINOR GENERAL (KITS) ×3 IMPLANT
KIT TURNOVER KIT A (KITS) ×3 IMPLANT
MANIFOLD NEPTUNE II (INSTRUMENTS) ×3 IMPLANT
NEEDLE HYPO 25X1 1.5 SAFETY (NEEDLE) ×3 IMPLANT
NS IRRIG 1000ML POUR BTL (IV SOLUTION) ×3 IMPLANT
PACK MINOR (CUSTOM PROCEDURE TRAY) ×3 IMPLANT
PAD ABD 5X9 TENDERSORB (GAUZE/BANDAGES/DRESSINGS) ×3 IMPLANT
PAD ARMBOARD 7.5X6 YLW CONV (MISCELLANEOUS) ×3 IMPLANT
SET BASIN LINEN APH (SET/KITS/TRAYS/PACK) ×3 IMPLANT
SUT ETHILON 3 0 FSL (SUTURE) ×6 IMPLANT
SUT VIC AB 3-0 SH 27 (SUTURE) ×4
SUT VIC AB 3-0 SH 27X BRD (SUTURE) ×2 IMPLANT
SYR CONTROL 10ML LL (SYRINGE) ×3 IMPLANT
TAPE PAPER 3X10 WHT MICROPORE (GAUZE/BANDAGES/DRESSINGS) ×3 IMPLANT

## 2018-05-29 NOTE — Discharge Instructions (Signed)
Discharge Instructions:  Shower per your regular routine. Replace dry dressing under the armpit daily or as needed if saturated. Expect some drainage.  Take tylenol and ibuprofen as needed for pain control, alternating every 4-6 hours.  Take Roxicodone for breakthrough pain. Take colace for constipation related to narcotic pain medication. By 24-48 hours start to elevate your arm in order to open up the armpit and prevent contraction down.  You will have to slowly start to stretch this area out.  Take your probiotic or eat yogurt with probiotics to continue to help prevent C dif.      Incision Care, Adult An incision is a cut that a doctor makes in your skin for surgery (for a procedure). Most times, these cuts are closed after surgery. Your cut from surgery may be closed with stitches (sutures), staples, skin glue, or skin tape (adhesive strips). You may need to return to your doctor to have stitches or staples taken out. This may happen many days or many weeks after your surgery. The cut needs to be well cared for so it does not get infected. How to care for your cut Cut care  Follow instructions from your doctor about how to take care of your cut. Make sure you: ? Wash your hands with soap and water before you change your bandage (dressing). If you cannot use soap and water, use hand sanitizer. ? Change your bandage as told by your doctor. ? Leave stitches, skin glue, or skin tape in place. They may need to stay in place for 2 weeks or longer. If tape strips get loose and curl up, you may trim the loose edges. Do not remove tape strips completely unless your doctor says it is okay.  Check your cut area every day for signs of infection. Check for: ? More redness, swelling, or pain. ? More fluid or blood. ? Warmth. ? Pus or a bad smell.  Ask your doctor how to clean the cut. This may include: ? Using mild soap and water. ? Using a clean towel to pat the cut dry after you clean  it. ? Putting a cream or ointment on the cut. Do this only as told by your doctor. ? Covering the cut with a clean bandage.  Ask your doctor when you can leave the cut uncovered.  Do not take baths, swim, or use a hot tub until your doctor says it is okay. Ask your doctor if you can take showers. You may only be allowed to take sponge baths for bathing. Medicines  If you were prescribed an antibiotic medicine, cream, or ointment, take the antibiotic or put it on the cut as told by your doctor. Do not stop taking or putting on the antibiotic even if your condition gets better.  Take over-the-counter and prescription medicines only as told by your doctor. General instructions  Limit movement around your cut. This helps healing. ? Avoid straining, lifting, or exercise for the first month, or for as long as told by your doctor. ? Follow instructions from your doctor about going back to your normal activities. ? Ask your doctor what activities are safe.  Protect your cut from the sun when you are outside for the first 6 months, or for as long as told by your doctor. Put on sunscreen around the scar or cover up the scar.  Keep all follow-up visits as told by your doctor. This is important. Contact a doctor if:  Your have more redness, swelling, or pain  around the cut.  You have more fluid or blood coming from the cut.  Your cut feels warm to the touch.  You have pus or a bad smell coming from the cut.  You have a fever or shaking chills.  You feel sick to your stomach (nauseous) or you throw up (vomit).  You are dizzy.  Your stitches or staples come undone. Get help right away if:  You have a red streak coming from your cut.  Your cut bleeds through the bandage and the bleeding does not stop with gentle pressure.  The edges of your cut open up and separate.  You have very bad (severe) pain.  You have a rash.  You are confused.  You pass out (faint).  You have trouble  breathing and you have a fast heartbeat. This information is not intended to replace advice given to you by your health care provider. Make sure you discuss any questions you have with your health care provider. Document Released: 09/23/2011 Document Revised: 03/08/2016 Document Reviewed: 03/08/2016 Elsevier Interactive Patient Education  2017 [employer]  PATIENT INSTRUCTIONS POST-ANESTHESIA  IMMEDIATELY FOLLOWING SURGERY:  Do not drive or operate machinery for the first twenty four hours after surgery.  Do not make any important decisions for twenty four hours after surgery or while taking narcotic pain medications or sedatives.  If you develop intractable nausea and vomiting or a severe headache please notify your doctor immediately.  FOLLOW-UP:  Please make an appointment with your surgeon as instructed. You do not need to follow up with anesthesia unless specifically instructed to do so.  WOUND CARE INSTRUCTIONS (if applicable):  Keep a dry clean dressing on the anesthesia/puncture wound site if there is drainage.  Once the wound has quit draining you may leave it open to air.  Generally you should leave the bandage intact for twenty four hours unless there is drainage.  If the epidural site drains for more than 36-48 hours please call the anesthesia department.  QUESTIONS?:  Please feel free to call your physician or the hospital operator if you have any questions, and they will be happy to assist you.

## 2018-05-29 NOTE — Transfer of Care (Signed)
Immediate Anesthesia Transfer of Care Note  Patient: Deborah Riddle  Procedure(s) Performed: EXCISION HIDRADENITIS AXILLA (Right Axilla)  Patient Location: PACU  Anesthesia Type:General  Level of Consciousness: awake and patient cooperative  Airway & Oxygen Therapy: Patient Spontanous Breathing  Post-op Assessment: Report given to RN, Post -op Vital signs reviewed and stable and Patient moving all extremities  Post vital signs: Reviewed and stable  Last Vitals:  Vitals Value Taken Time  BP    Temp    Pulse    Resp    SpO2      Last Pain:  Vitals:   05/29/18 0651  TempSrc: Oral  PainSc: 0-No pain         Complications: No apparent anesthesia complications

## 2018-05-29 NOTE — Anesthesia Preprocedure Evaluation (Signed)
Anesthesia Evaluation    Airway Mallampati: II       Dental  (+) Teeth Intact   Pulmonary former smoker,    breath sounds clear to auscultation       Cardiovascular + dysrhythmias  Rhythm:regular     Neuro/Psych    GI/Hepatic GERD  ,  Endo/Other    Renal/GU      Musculoskeletal   Abdominal   Peds  Hematology   Anesthesia Other Findings NSR 70 w/h/o Paroxysmal AFIB Morbid obesity Sickle cell PCOS Decreased excursion due to obesity  Reproductive/Obstetrics                             Anesthesia Physical Anesthesia Plan  ASA: III  Anesthesia Plan: General   Post-op Pain Management:    Induction:   PONV Risk Score and Plan:   Airway Management Planned:   Additional Equipment:   Intra-op Plan:   Post-operative Plan:   Informed Consent:   Plan Discussed with: Anesthesiologist  Anesthesia Plan Comments:         Anesthesia Quick Evaluation

## 2018-05-29 NOTE — Anesthesia Procedure Notes (Signed)
Procedure Name: LMA Insertion Date/Time: 05/29/2018 7:48 AM Performed by: Despina HiddenIdacavage, Lorece Keach J, CRNA Pre-anesthesia Checklist: Patient identified, Patient being monitored, Emergency Drugs available, Timeout performed and Suction available Patient Re-evaluated:Patient Re-evaluated prior to induction Oxygen Delivery Method: Circle System Utilized Preoxygenation: Pre-oxygenation with 100% oxygen Induction Type: IV induction Ventilation: Mask ventilation without difficulty LMA: LMA inserted LMA Size: 4.0 Number of attempts: 1 Placement Confirmation: positive ETCO2 and breath sounds checked- equal and bilateral Tube secured with: Tape Dental Injury: Teeth and Oropharynx as per pre-operative assessment

## 2018-05-29 NOTE — Anesthesia Postprocedure Evaluation (Signed)
Anesthesia Post Note  Patient: Deborah Riddle  Procedure(s) Performed: EXCISION HIDRADENITIS AXILLA (Right Axilla)  Patient location during evaluation: PACU Anesthesia Type: General Level of consciousness: awake and alert and patient cooperative Pain management: satisfactory to patient Vital Signs Assessment: post-procedure vital signs reviewed and stable Respiratory status: spontaneous breathing Cardiovascular status: stable Postop Assessment: no apparent nausea or vomiting Anesthetic complications: no     Last Vitals:  Vitals:   05/29/18 0915 05/29/18 0934  BP: (!) 135/96 121/78  Pulse: 86 64  Resp: 20 18  Temp:  37.1 C  SpO2: 95% 95%    Last Pain:  Vitals:   05/29/18 0934  TempSrc: Oral  PainSc: 5                  Brit Wernette

## 2018-05-29 NOTE — Interval H&P Note (Signed)
History and Physical Interval Note:  05/29/2018 7:28 AM  Deborah Riddle  has presented today for surgery, with the diagnosis of hidradenitis  The various methods of treatment have been discussed with the patient and family. After consideration of risks, benefits and other options for treatment, the patient has consented to  Procedure(s): EXCISION HIDRADENITIS AXILLA (Right) as a surgical intervention .  The patient's history has been reviewed, patient examined, no change in status, stable for surgery.  I have reviewed the patient's chart and labs.  Questions were answered to the patient's satisfaction.    No changes. LMA being recommended by anesthesia. Questions answered.   Lucretia RoersLindsay C Caitlen Worth

## 2018-05-29 NOTE — Op Note (Signed)
[facility] Operative Note  05/29/18  Preoperative Diagnosis:  Right axillary hidradenitis    Postoperative Diagnosis: Same   Procedure(s) Performed: Excision of hidradenitis (4cm sq tissue total)   Surgeon: Leatrice JewelsLindsay C. Henreitta LeberBridges, MD   Assistants: No qualified resident was available    Anesthesia: General endotracheal   Anesthesiologist: Arbie Cookeyameransi, Benjamin G, MD    Specimens: Right axilla hidradenitis    Estimated Blood Loss: Minimal   Blood Replacement: None    Complications: None   Wound Class:Contaminated (inflamed tissue)    Operative Indications: Ms. Deborah Riddle is a 32 yo with a history of recurrent infections and drainage in the right axilla and palpable cord that is consistent with a minor case of hidradenitis.  She has had infections since the summer and the area has continued to drain. She had issues with antibiotics causing her C dif, and would like to not have to take recurrent antibiotics. Given this issue and her recurrent infection, we opted to excise that area to prevent future infections / abscesses.   Findings: Right axilla induration / inflamed tissue, no active purulence    Procedure: The patient was taken to the operating room and placed supine. General endotracheal anesthesia was induced. Intravenous antibiotics were administered per protocol.  The right axilla was prepped and draped in the normal sterile fashion.  The area of induration had been marked in the preoperative area.    An elliptical incision was made around the areas of induration and brought down through the subcutaneous tissue, excising all hardened and inflamed appearing tissue with electrocautery.  The remaining subcutaneous fat was healthy. No active purulence was identified.  The wound was made hemostatic and the deep subcutaneous pocket was closed with interrupted 3-0 Vicryl as well as the dermal layer. The skin was closed with spaced 3-0 Nylon to allow for drainage. A Sterile  ABD and paper tape was applied.   All counts were correct at the end of the case. The patient was awakened from anesthesia and extubate without complication.  The patient went to the PACU in stable condition.   Algis GreenhouseLindsay Bridges, MD [facility] [address] [place], [place] [phone] [phone] (office)

## 2018-06-01 ENCOUNTER — Encounter (HOSPITAL_COMMUNITY): Payer: Self-pay | Admitting: General Surgery

## 2018-06-09 ENCOUNTER — Ambulatory Visit (INDEPENDENT_AMBULATORY_CARE_PROVIDER_SITE_OTHER): Payer: Self-pay | Admitting: General Surgery

## 2018-06-09 ENCOUNTER — Encounter: Payer: Self-pay | Admitting: General Surgery

## 2018-06-09 VITALS — BP 143/79 | HR 70 | Temp 97.1°F | Resp 18 | Wt 291.0 lb

## 2018-06-09 DIAGNOSIS — L732 Hidradenitis suppurativa: Secondary | ICD-10-CM

## 2018-06-09 NOTE — Patient Instructions (Signed)
Keep area clean. Keep gauze in area. Expect some minor yellow drainage. Watch out for white drainage/ heat/ swelling from the area which could be a concern for infection. The hardness will improve with time and healing.  Stretch daily.

## 2018-06-09 NOTE — Progress Notes (Signed)
[facility] Note   HPI:  32 y.o. Female presents to clinic for post-op follow-up evaluation after hidradenitis excision from the right armpit. Patient reports she is doing well. Having some minor drainage. Feels a pulling/ tight sensation.  Review of Systems:  No fever or chills No erythema or swelling in the area All other review of systems: otherwise negative    Pathology: Diagnosis Hidradenitis, right axilla - HIDRADENITIS SUPPURATIVA  Vital Signs:  BP (!) 143/79 (BP Location: Left Arm, Patient Position: Sitting, Cuff Size: Large)   Pulse 70   Temp (!) 97.1 F (36.2 C) (Temporal)   Resp 18   Wt 291 lb (132 kg)   BMI 42.97 kg/m    Physical Exam:  Physical Exam  Cardiovascular: Normal rate.  Pulmonary/Chest: Effort normal.  Musculoskeletal:  Right axilla sutures removed, some maceration of the skin superiorly, healed inferiorly, indurated, no drainage, no erythema   Vitals reviewed.   Assessment:  32 y.o. yo Female with hidradenitis of the right armpit. Doing well.  Plan:  Keep area clean. Keep gauze in area. Expect some minor yellow drainage. Watch out for white drainage/ heat/ swelling from the area which could be a concern for infection. The hardness will improve with time and healing.  Stretch daily.   All of the above recommendations were discussed with the patient, and all of patient's questions were answered to her expressed satisfaction.  Algis GreenhouseLindsay Bridges, MD [facility] [address] [place], [place] [phone] (office)

## 2018-06-13 ENCOUNTER — Encounter: Payer: Self-pay | Admitting: General Surgery

## 2018-06-18 ENCOUNTER — Ambulatory Visit (INDEPENDENT_AMBULATORY_CARE_PROVIDER_SITE_OTHER): Payer: Self-pay | Admitting: General Surgery

## 2018-06-18 ENCOUNTER — Encounter: Payer: Self-pay | Admitting: General Surgery

## 2018-06-18 VITALS — BP 144/88 | HR 78 | Temp 98.4°F | Resp 20 | Wt 285.8 lb

## 2018-06-18 DIAGNOSIS — T8130XA Disruption of wound, unspecified, initial encounter: Secondary | ICD-10-CM | POA: Insufficient documentation

## 2018-06-18 NOTE — Patient Instructions (Addendum)
Wound dehiscence.  Dry gauze to area daily. Expect some drainage. Should be healed / almost healed in 2 weeks. Stretch arm. Warm compress. Ibuprofen every 4-6 hours daily for about 1 week to see if this can help with inflammation.

## 2018-06-18 NOTE — Progress Notes (Signed)
[facility] Note   HPI:  32 y.o. Female presents to clinic for follow-up evaluation of her wound. Patient reports some drainage and opening of the wound.  Review of Systems:  No fevers or chills No redness All other review of systems: otherwise negative   Vital Signs:  BP (!) 144/88 (BP Location: Left Arm, Patient Position: Sitting, Cuff Size: Large)   Pulse 78   Temp 98.4 F (36.9 C) (Temporal)   Resp 20   Wt 285 lb 12.8 oz (129.6 kg)   BMI 42.21 kg/m    Physical Exam:  Physical Exam  Pulmonary/Chest: Effort normal.  Right axilla with superficial wound dehiscence, no redness or drainage, qtip probed to <0.5cm depth, silver nitrate applied  Musculoskeletal:  Superficial cord like area on the right arm, felt best at the inner Antecubital fossa area medially, tender  Vitals reviewed.   Assessment:  32 y.o. yo Female with a superficial wound dehiscence and what feels like a very superficial clotted vein under the skin versus inflammation to the lymph gland / or muscle fibers. No redness or signs of infection.  Plan:  Dry gauze to area daily. Expect some drainage. Should be healed / almost healed in 2 weeks. Stretch arm. Warm compress. Ibuprofen every 4-6 hours daily for about 1 week to see if this can help with inflammation. If the area in the arm does not improve, may need to do an US but again this is very superficial and likely a clotted vein or some tendonous part of muscle that is aggravated from surgery and lack of use Follow up in 2 weeks if needed   All of the above recommendations were discussed with the patient, and all of patient's questions were answered to her expressed satisfaction.  Algis GreenhouseLindsay Bridges, MD [facility] [address] [place], [place] [phone] (office)

## 2018-06-24 ENCOUNTER — Encounter: Payer: Self-pay | Admitting: *Deleted

## 2018-06-24 ENCOUNTER — Encounter: Payer: Self-pay | Admitting: Gastroenterology

## 2018-06-24 ENCOUNTER — Ambulatory Visit (INDEPENDENT_AMBULATORY_CARE_PROVIDER_SITE_OTHER): Payer: BLUE CROSS/BLUE SHIELD | Admitting: Gastroenterology

## 2018-06-24 ENCOUNTER — Encounter

## 2018-06-24 ENCOUNTER — Other Ambulatory Visit: Payer: Self-pay | Admitting: *Deleted

## 2018-06-24 DIAGNOSIS — R1010 Upper abdominal pain, unspecified: Secondary | ICD-10-CM

## 2018-06-24 DIAGNOSIS — R1310 Dysphagia, unspecified: Secondary | ICD-10-CM

## 2018-06-24 DIAGNOSIS — R1013 Epigastric pain: Secondary | ICD-10-CM

## 2018-06-24 MED ORDER — ONDANSETRON HCL 4 MG PO TABS: ORAL_TABLET | ORAL | 1 refills | Status: DC

## 2018-06-24 NOTE — Assessment & Plan Note (Addendum)
BMS IMPROVED.  Upper abdominal pain CONTINUEs. DIFFERENTIAL DIAGNOSIS INCLUDES: H PYLORI GASTRITIS, LESS LIKELY CELIAC SPRUE, or PUD.  DRINK WATER TO KEEP YOUR URINE LIGHT YELLOW. FOLLOW LOW FODMAP DIET.   HANDOUT GIVEN. CONTINUE PROTONIX. TAKE 30 MINUTES PRIOR TO BREAKFAST. CONTINUE FIBER GUMMIES AND CUT BACK ON STOOL SOFTENER TO HAVE 2-3 BMs A WEEK. CONTINUE PROBIOTIC DAILY. COMPLETE UPPER ENDOSCOPY WITH POSSIBLE DILATION IN JAN 2020. DISCUSSED PROCEDURE, BENEFITS, & RISKS: < 1% chance of medication reaction, bleeding, perforation, or rupture of spleen/liver.  FOLLOW UP IN 4 MOS.

## 2018-06-24 NOTE — Progress Notes (Signed)
Subjective:    Patient ID: Deborah Riddle, female    DOB: Oct 10, 1985, 32 y.o.   MRN: [id number]  Ignatius Specking, MD  HPI FIBER GUMMIES HELPED. STILL HAVING UPPER/EPIGASTRIC PAIN(DULL, ACHY CRAMPY) STILL. OCCURS 3X/WEEK. THINKS MAY BE WITH BMs. NOTICES BM MAY HELP RELIEVE IT. BMs: WILL MESS HER WHOLE DAY UP. MAY LAST ALL DAY. STOPPED TAKING IBUPROFEN AND UPPER ABDOMINAL PAIN SINCE SAW ME LAST AND BEFORE. TAKING STOOL SOFTENERS WITH FIBER GUMMIES. NEVER HAD EGD BEFORE.  NAUSEA OFF AND ON. BMs: AT LEAST ONCE A DAY. IF SHE SKIPS ONCE A DAY NO PROBLEMS AT ALL. WHEN SHE GOES OR IT'S TIME TO GO THEN SHE STARTS HAVING PROBLEMS. MAY FEEL LIKE SOMETHING STUCK IN HER MID ABDOMEN. BEEN EATING LIGHT LUNCH: SOUP/SALAD(HELPS). Problems with sedation: HAD BAD LEG PAIN AFTER HER SURGERY.  PT DENIES FEVER, CHILLS, HEMATOCHEZIA, HEMATEMESIS, vomiting, melena, diarrhea, CHEST PAIN, SHORTNESS OF BREATH, CHANGE IN BOWEL IN HABITS, constipation, OR heartburn or indigestion.  Past Medical History:  Diagnosis Date  . Body mass index 40.0-44.9, adult (HCC)   . Chest pain, unspecified   . Dysrhythmia    AFib  . GERD (gastroesophageal reflux disease)   . Morbid obesity (HCC)   . Paroxysmal atrial fibrillation (HCC)   . Polycystic disease, ovaries   . Sickle cell trait (HCC)   . Tobacco use disorder     Past Surgical History:  Procedure Laterality Date  . HYDRADENITIS EXCISION Right 05/29/2018   Procedure: EXCISION HIDRADENITIS AXILLA;  Surgeon: Lucretia Roers, MD;  Location: [facility];  Service: General;  Laterality: Right;  . none    . WISDOM TOOTH EXTRACTION     Allergies  Allergen Reactions  . Dificid [Fidaxomicin] Shortness Of Breath  . Amoxicillin Swelling  . Clindamycin/Lincomycin     "caused c diff"  . Heparin Other (See Comments)    Lowers BP Lowers blood pressure     Current Outpatient Medications  Medication Sig    . FIBER ADULT GUMMIES PO Take 3 tablets by mouth See admin  instructions.     Marland Kitchen ibuprofen (ADVIL,MOTRIN) 200 MG tablet Take 400 mg by mouth daily.    . pantoprazole (PROTONIX) 40 MG tablet  Take 40 mg by mouth daily before breakfast. 1 po 30 mins prior to first meal)    . Probiotic Product (PROBIOTIC DAILY PO) Take 1 tablet by mouth daily.    . Vitamin D, Ergocalciferol, (DRISDOL) 50000 UNITS CAPS capsule Take 50,000 Units by mouth once a week.     Marland Kitchen acetaminophen (TYLENOL) 325 MG tablet Take 650 mg by mouth every 6 (six) hours as needed for moderate pain.    .       Review of Systems PER HPI OTHERWISE ALL SYSTEMS ARE NEGATIVE.    Objective:   Physical Exam  Constitutional: She is oriented to person, place, and time. She appears well-developed and well-nourished. No distress.  HENT:  Head: Normocephalic and atraumatic.  Mouth/Throat: Oropharynx is clear and moist. No oropharyngeal exudate.  Eyes: Pupils are equal, round, and reactive to light. No scleral icterus.  Neck: Normal range of motion. Neck supple.  Cardiovascular: Normal rate, regular rhythm and normal heart sounds.  Pulmonary/Chest: Effort normal and breath sounds normal. No respiratory distress.  Abdominal: Soft. Bowel sounds are normal. She exhibits no distension. There is no tenderness.  Musculoskeletal: She exhibits no edema.  Lymphadenopathy:    She has no cervical adenopathy.  Neurological: She is alert and oriented to person,  place, and time.  Psychiatric: She has a normal mood and affect.  Vitals reviewed.      Assessment & Plan:

## 2018-06-24 NOTE — Patient Instructions (Addendum)
DRINK WATER TO KEEP YOUR URINE LIGHT YELLOW.  FOLLOW LOW FODMAP DIET.  SEE HANDOUT.  CONTINUE PROTONIX. TAKE 30 MINUTES PRIOR TO BREAKFAST.  CONTINUE FIBER GUMMIES AND CUT BACK ON STOOL SOFTENER TO HAVE 2-3 BMs A WEEK.  CONTINUE PROBIOTIC DAILY.  COMPLETE UPPER ENDOSCOPY WITH POSSIBLE DILATION IN JAN 2020.  FOLLOW UP IN 4 MOS.

## 2018-06-25 NOTE — Progress Notes (Signed)
ON RECALL  °

## 2018-06-25 NOTE — Progress Notes (Signed)
CC'D TO PCP °

## 2018-07-15 ENCOUNTER — Emergency Department (HOSPITAL_COMMUNITY): Payer: BLUE CROSS/BLUE SHIELD

## 2018-07-15 ENCOUNTER — Encounter (HOSPITAL_COMMUNITY): Payer: Self-pay | Admitting: Emergency Medicine

## 2018-07-15 ENCOUNTER — Other Ambulatory Visit: Payer: Self-pay

## 2018-07-15 ENCOUNTER — Emergency Department (HOSPITAL_COMMUNITY)
Admission: EM | Admit: 2018-07-15 | Discharge: 2018-07-15 | Disposition: A | Discharge status: Home / Self Care | Payer: BLUE CROSS/BLUE SHIELD | Attending: Emergency Medicine | Admitting: Emergency Medicine

## 2018-07-15 ENCOUNTER — Emergency Department (HOSPITAL_COMMUNITY)
Admission: EM | Admit: 2018-07-15 | Discharge: 2018-07-15 | Discharge status: Absent without leave | Payer: BLUE CROSS/BLUE SHIELD

## 2018-07-15 DIAGNOSIS — Z79899 Other long term (current) drug therapy: Secondary | ICD-10-CM | POA: Insufficient documentation

## 2018-07-15 DIAGNOSIS — R1011 Right upper quadrant pain: Secondary | ICD-10-CM | POA: Diagnosis not present

## 2018-07-15 DIAGNOSIS — K5900 Constipation, unspecified: Secondary | ICD-10-CM | POA: Diagnosis not present

## 2018-07-15 DIAGNOSIS — Z87891 Personal history of nicotine dependence: Secondary | ICD-10-CM | POA: Diagnosis not present

## 2018-07-15 LAB — COMPREHENSIVE METABOLIC PANEL
ALBUMIN: 3.6 g/dL (ref 3.5–5.0)
ALK PHOS: 76 U/L (ref 38–126)
ALT: 19 U/L (ref 0–44)
ANION GAP: 6 (ref 5–15)
AST: 19 U/L (ref 15–41)
BUN: 12 mg/dL (ref 6–20)
CALCIUM: 9.1 mg/dL (ref 8.9–10.3)
CHLORIDE: 108 mmol/L (ref 98–111)
CO2: 25 mmol/L (ref 22–32)
Creatinine, Ser: 0.82 mg/dL (ref 0.44–1.00)
GFR calc Af Amer: >60 mL/min (ref >60)
GFR calc non Af Amer: >60 mL/min (ref >60)
GLUCOSE: 94 mg/dL (ref 70–99)
Potassium: 3.8 mmol/L (ref 3.5–5.1)
SODIUM: 139 mmol/L (ref 135–145)
Total Bilirubin: 0.3 mg/dL (ref 0.3–1.2)
Total Protein: 7.3 g/dL (ref 6.5–8.1)

## 2018-07-15 LAB — CBC
HEMATOCRIT: 39.5 % (ref 36.0–46.0)
Hemoglobin: 12.7 g/dL (ref 12.0–15.0)
MCH: 24.1 pg — ABNORMAL LOW (ref 26.0–34.0)
MCHC: 32.2 g/dL (ref 30.0–36.0)
MCV: 75.1 fL — AB (ref 80.0–100.0)
PLATELETS: 191 10*3/uL (ref 150–400)
RBC: 5.26 MIL/uL — AB (ref 3.87–5.11)
RDW: 15.9 % — ABNORMAL HIGH (ref 11.5–15.5)
WBC: 9.5 10*3/uL (ref 4.0–10.5)
nRBC: 0 % (ref 0.0–0.2)

## 2018-07-15 LAB — URINALYSIS, ROUTINE W REFLEX MICROSCOPIC
Bilirubin Urine: NEGATIVE
GLUCOSE, UA: NEGATIVE mg/dL
HGB URINE DIPSTICK: NEGATIVE
Ketones, ur: NEGATIVE mg/dL
Leukocytes, UA: NEGATIVE
Nitrite: NEGATIVE
PH: 6 (ref 5.0–8.0)
PROTEIN: NEGATIVE mg/dL
Specific Gravity, Urine: 1.021 (ref 1.005–1.030)

## 2018-07-15 LAB — I-STAT BETA HCG BLOOD, ED (MC, WL, AP ONLY): I-stat hCG, quantitative: <5 m[IU]/mL (ref <5)

## 2018-07-15 LAB — LIPASE, BLOOD: Lipase: 42 U/L (ref 11–51)

## 2018-07-15 MED ORDER — ACETAMINOPHEN 325 MG PO TABS
650.0000 mg | ORAL_TABLET | Freq: Once | ORAL | Status: AC
  Administered 2018-07-15: 650 mg via ORAL
  Filled 2018-07-15: qty 2

## 2018-07-15 MED ORDER — POLYETHYLENE GLYCOL 3350 17 G PO PACK: 17.0000 g | PACK | Freq: Every day | ORAL | 0 refills | Status: DC | PRN

## 2018-07-15 MED ORDER — DICYCLOMINE HCL 20 MG PO TABS: 20.0000 mg | ORAL_TABLET | Freq: Two times a day (BID) | ORAL | 0 refills | Status: DC | PRN

## 2018-07-15 NOTE — ED Triage Notes (Addendum)
Patient reports abdominal pain that spreads across her stomach and into her back on the right. Has been seeing GI and is scheduled for an EGD. Onset of worsening symptoms 2 days ago.

## 2018-07-15 NOTE — ED Notes (Signed)
Patient ambulated to restroom without any assistance to obtain urine specimen at this time. Patient asking for Tylenol at this time. RN Casimiro Needle made aware.

## 2018-07-15 NOTE — ED Provider Notes (Signed)
[facility] EMERGENCY DEPARTMENT Provider Note   CSN: [id number] Arrival date & time: 07/15/18  1848     History   Chief Complaint Chief Complaint  Patient presents with  . Abdominal Pain    HPI Deborah Riddle is a 33 y.o. female.  HPI Patient has chronic abdominal pain is followed by gastroenterologist.  Over the last 2 days she is had worsening right upper quadrant pain that radiates through to her back.  States this pain has been constant.  She has had nausea with no vomiting.  Pain does not seem to be related to food.  She is having soft bowel movements.  Denies urinary symptoms including dysuria, hematuria, frequency or urgency.  No fever. Past Medical History:  Diagnosis Date  . Body mass index 40.0-44.9, adult (HCC)   . Chest pain, unspecified   . Dysrhythmia    AFib  . GERD (gastroesophageal reflux disease)   . Morbid obesity (HCC)   . Paroxysmal atrial fibrillation (HCC)   . Polycystic disease, ovaries   . Sickle cell trait (HCC)   . Tobacco use disorder     Patient Active Problem List   Diagnosis Date Noted  . Wound dehiscence 06/18/2018  . Hidradenitis 05/19/2018  . Abdominal pain 05/13/2018  . Morbid obesity (HCC) 12/22/2014  . Chest pain 09/23/2011  . Paroxysmal atrial fibrillation (HCC)   . Palpitations 03/23/2009    Past Surgical History:  Procedure Laterality Date  . HYDRADENITIS EXCISION Right 05/29/2018   Procedure: EXCISION HIDRADENITIS AXILLA;  Surgeon: Lucretia RoersBridges, Lindsay C, MD;  Location: [facility];  Service: General;  Laterality: Right;  . none    . WISDOM TOOTH EXTRACTION       OB History    Gravida      Para      Term      Preterm      AB      Living  0     SAB      TAB      Ectopic      Multiple      Live Births               Home Medications    Prior to Admission medications   Medication Sig Start Date End Date Taking? Authorizing Provider  acetaminophen (TYLENOL) 500 MG tablet Take 500 mg by mouth every  6 (six) hours as needed for mild pain or moderate pain.   Yes [provider]  docusate sodium (COLACE) 100 MG capsule Take 1 capsule (100 mg total) by mouth 2 (two) times daily. Patient taking differently: Take 100 mg by mouth daily as needed for mild constipation or moderate constipation.  05/29/18 05/29/19 Yes Lucretia RoersBridges, Lindsay C, MD  FIBER ADULT GUMMIES PO Take 2 tablets by mouth at bedtime.    Yes [provider]  ibuprofen (ADVIL,MOTRIN) 200 MG tablet Take 400 mg by mouth daily.   Yes [provider]  pantoprazole (PROTONIX) 40 MG tablet 1 po 30 mins prior to first meal Patient taking differently: Take 40 mg by mouth daily before breakfast. 1 po 30 mins prior to first meal 05/13/18  Yes Fields, Sandi L, MD  Probiotic Product (PROBIOTIC DAILY PO) Take 1 tablet by mouth at bedtime.    Yes [provider]  Vitamin D, Ergocalciferol, (DRISDOL) 50000 UNITS CAPS capsule Take 50,000 Units by mouth every Saturday.  04/22/14  Yes [provider]  dicyclomine (BENTYL) 20 MG tablet Take 1 tablet (20 mg  total) by mouth 2 (two) times daily as needed for spasms. 07/15/18   Loren Racer, MD  ondansetron (ZOFRAN) 4 MG tablet 1 PO 30 MINUTES PRIOR TO MEALS TID AND AT BEDTIME Patient not taking: Reported on 07/15/2018 06/24/18   West Bali, MD  polyethylene glycol (MIRALAX / GLYCOLAX) packet Take 17 g by mouth daily as needed for mild constipation or moderate constipation. 07/15/18   Loren Racer, MD  metoprolol tartrate (LOPRESSOR) 25 MG tablet Take 25 mg by mouth daily as needed. For racing heart  09/30/11  [provider]    Family History Family History  Problem Relation Age of Onset  . Arrhythmia Mother 58       History of Cardiac Stents  . Hypertension Mother   . Diabetes Mother   . COPD Mother   . Colon polyps Mother   . Hypertension Father   . Diabetes Other        Family History  . Hypertension Other        Family History  .  Diabetes Sister   . Hypertension Sister   . Colon cancer Maternal Uncle   . Crohn's disease Cousin     Social History Social History   Tobacco Use  . Smoking status: Former Smoker    Packs/day: 1.00    Years: 12.00    Pack years: 12.00    Types: Cigarettes    Start date: 01/21/2004    Last attempt to quit: 09/10/2011    Years since quitting: 6.8  . Smokeless tobacco: Never Used  Substance Use Topics  . Alcohol use: Never    Alcohol/week: 0.0 standard drinks    Frequency: Never  . Drug use: No     Allergies   Dificid [fidaxomicin]; Amoxicillin; Clindamycin/lincomycin; and Heparin   Review of Systems Review of Systems  Constitutional: Negative for fatigue and fever.  HENT: Negative for sinus pain, sore throat and trouble swallowing.   Eyes: Negative for visual disturbance.  Respiratory: Negative for cough and shortness of breath.   Cardiovascular: Negative for chest pain and leg swelling.  Gastrointestinal: Positive for abdominal pain and nausea. Negative for constipation, diarrhea and vomiting.  Genitourinary: Negative for dysuria, flank pain, frequency, hematuria and pelvic pain.  Musculoskeletal: Positive for back pain and myalgias. Negative for neck pain and neck stiffness.  Skin: Negative for rash and wound.  Neurological: Negative for dizziness, weakness, light-headedness, numbness and headaches.  All other systems reviewed and are negative.    Physical Exam Updated Vital Signs BP 104/86 (BP Location: Left Arm)   Pulse 73   Temp 98.6 F (37 C) (Oral)   Resp 20   Ht 5\' 7"  (1.702 m)   Wt 131.5 kg   LMP 07/15/2016   SpO2 96%   BMI 45.42 kg/m   Physical Exam Vitals signs and nursing note reviewed.  Constitutional:      Appearance: Normal appearance. She is well-developed.  HENT:     Head: Normocephalic and atraumatic.     Nose: Nose normal.     Mouth/Throat:     Mouth: Mucous membranes are moist.     Pharynx: No oropharyngeal exudate or posterior  oropharyngeal erythema.  Eyes:     Extraocular Movements: Extraocular movements intact.     Pupils: Pupils are equal, round, and reactive to light.  Neck:     Musculoskeletal: Normal range of motion and neck supple. No neck rigidity or muscular tenderness.  Cardiovascular:     Rate and Rhythm: Normal  rate and regular rhythm.     Heart sounds: No murmur. No friction rub. No gallop.   Pulmonary:     Effort: Pulmonary effort is normal. No respiratory distress.     Breath sounds: Normal breath sounds. No stridor. No wheezing, rhonchi or rales.  Chest:     Chest wall: No tenderness.  Abdominal:     General: Bowel sounds are normal.     Palpations: Abdomen is soft.     Tenderness: There is abdominal tenderness. There is no guarding or rebound.     Comments: Mild left upper quadrant, epigastric, right upper quadrant, right lower quadrant tenderness to palpation.  No rebound or guarding.  Musculoskeletal: Normal range of motion.        General: No tenderness.     Comments: No CVA tenderness bilaterally.  Midline thoracic no midline thoracic or lumbar tenderness.  Lymphadenopathy:     Cervical: No cervical adenopathy.  Skin:    General: Skin is warm and dry.     Findings: No erythema or rash.  Neurological:     General: No focal deficit present.     Mental Status: She is alert and oriented to person, place, and time.  Psychiatric:        Behavior: Behavior normal.      ED Treatments / Results  Labs (all labs ordered are listed, but only abnormal results are displayed) Labs Reviewed  CBC - Abnormal; Notable for the following components:      Result Value   RBC 5.26 (*)    MCV 75.1 (*)    MCH 24.1 (*)    RDW 15.9 (*)    All other components within normal limits  LIPASE, BLOOD  COMPREHENSIVE METABOLIC PANEL  URINALYSIS, ROUTINE W REFLEX MICROSCOPIC  I-STAT BETA HCG BLOOD, ED ([facility], [facility], [facility] ONLY)    EKG None  Radiology Dg Abdomen 1 View  Result Date: 07/15/2018 CLINICAL  DATA:  Abdominal pain, nausea EXAM: ABDOMEN - 1 VIEW COMPARISON:  05/19/2014 abdominal radiograph FINDINGS: No dilated small bowel loops. No evidence of pneumatosis or pneumoperitoneum. Moderate colorectal stool volume, most prominent in the proximal colon. No radiopaque nephrolithiasis. IMPRESSION: 1. Nonobstructive bowel gas pattern. 2. Moderate colorectal stool volume, most prominent in the proximal colon, suggesting constipation. Electronically Signed   By: Delbert PhenixJason A Poff M.D.   On: 07/15/2018 21:08    Procedures Procedures (including critical care time)  Medications Ordered in ED Medications  acetaminophen (TYLENOL) tablet 650 mg (650 mg Oral Given 07/15/18 2014)     Initial Impression / Assessment and Plan / ED Course  I have reviewed the triage vital signs and the nursing notes.  Pertinent labs & imaging results that were available during my care of the patient were reviewed by me and considered in my medical decision making (see chart for details).    Laboratory work-up is within normal limits.  X-ray with prominent stool in the proximal colon.  This is likely contributing to the patient's symptoms.  Will place on MiraLAX and Bentyl.  She is advised to follow-up with her gastroenterologist and return precautions given.   Final Clinical Impressions(s) / ED Diagnoses   Final diagnoses:  Right upper quadrant abdominal pain  Constipation, unspecified constipation type    ED Discharge Orders         Ordered    polyethylene glycol (MIRALAX / GLYCOLAX) packet  Daily PRN     07/15/18 2140    dicyclomine (BENTYL) 20 MG tablet  2 times  daily PRN     07/15/18 2140           Loren Racer, MD 07/15/18 2156

## 2018-07-15 NOTE — ED Notes (Signed)
Pt ambulatory to waiting room. Pt verbalized understanding of discharge instructions.   

## 2018-07-27 ENCOUNTER — Telehealth: Payer: Self-pay

## 2018-07-27 NOTE — Telephone Encounter (Signed)
Called pt to see if she can arrive at 7:30am for EGD-/+DIL 08/07/18. Pt stated she may need to reschedule procedure. She will call her supervisor and call our office back.

## 2018-07-28 NOTE — Telephone Encounter (Signed)
Pt called office, EGD/-/+DIL moved up to 08/07/18 at 8:30am. New instructions mailed. She is aware to be NPO after midnight prior to procedure. Endo scheduler informed.

## 2018-07-28 NOTE — Telephone Encounter (Signed)
Tried to call pt, no answer, LMOVM for return call.  

## 2018-08-06 ENCOUNTER — Other Ambulatory Visit: Payer: Self-pay | Admitting: Gastroenterology

## 2018-08-07 ENCOUNTER — Ambulatory Visit (HOSPITAL_COMMUNITY)
Admission: RE | Admit: 2018-08-07 | Discharge: 2018-08-07 | Disposition: A | Discharge status: Home / Self Care | Payer: BLUE CROSS/BLUE SHIELD | Attending: Gastroenterology | Admitting: Gastroenterology

## 2018-08-07 ENCOUNTER — Other Ambulatory Visit: Payer: Self-pay

## 2018-08-07 ENCOUNTER — Encounter (HOSPITAL_COMMUNITY): Payer: Self-pay | Admitting: *Deleted

## 2018-08-07 ENCOUNTER — Encounter (HOSPITAL_COMMUNITY)
Admission: RE | Disposition: A | Discharge status: Home / Self Care | Payer: Self-pay | Source: Home / Self Care | Attending: Gastroenterology

## 2018-08-07 DIAGNOSIS — K2950 Unspecified chronic gastritis without bleeding: Secondary | ICD-10-CM | POA: Insufficient documentation

## 2018-08-07 DIAGNOSIS — Z87891 Personal history of nicotine dependence: Secondary | ICD-10-CM | POA: Diagnosis not present

## 2018-08-07 DIAGNOSIS — R1310 Dysphagia, unspecified: Secondary | ICD-10-CM | POA: Insufficient documentation

## 2018-08-07 DIAGNOSIS — Z79899 Other long term (current) drug therapy: Secondary | ICD-10-CM | POA: Insufficient documentation

## 2018-08-07 DIAGNOSIS — R1013 Epigastric pain: Secondary | ICD-10-CM

## 2018-08-07 DIAGNOSIS — K219 Gastro-esophageal reflux disease without esophagitis: Secondary | ICD-10-CM | POA: Insufficient documentation

## 2018-08-07 DIAGNOSIS — Z791 Long term (current) use of non-steroidal anti-inflammatories (NSAID): Secondary | ICD-10-CM | POA: Insufficient documentation

## 2018-08-07 DIAGNOSIS — K2970 Gastritis, unspecified, without bleeding: Secondary | ICD-10-CM

## 2018-08-07 HISTORY — PX: SAVORY DILATION: SHX5439

## 2018-08-07 HISTORY — PX: BIOPSY: SHX5522

## 2018-08-07 HISTORY — PX: MALONEY DILATION: SHX5535

## 2018-08-07 HISTORY — PX: ESOPHAGOGASTRODUODENOSCOPY: SHX5428

## 2018-08-07 SURGERY — EGD (ESOPHAGOGASTRODUODENOSCOPY)
Anesthesia: Moderate Sedation

## 2018-08-07 MED ORDER — STERILE WATER FOR IRRIGATION IR SOLN
Status: DC | PRN
  Administered 2018-08-07: 100 mL

## 2018-08-07 MED ORDER — PROMETHAZINE HCL 25 MG/ML IJ SOLN
INTRAMUSCULAR | Status: DC | PRN
  Administered 2018-08-07: 12.5 mg via INTRAVENOUS

## 2018-08-07 MED ORDER — MEPERIDINE HCL 100 MG/ML IJ SOLN
INTRAMUSCULAR | Status: AC
  Filled 2018-08-07: qty 2

## 2018-08-07 MED ORDER — MIDAZOLAM HCL 5 MG/5ML IJ SOLN
INTRAMUSCULAR | Status: AC
  Filled 2018-08-07: qty 10

## 2018-08-07 MED ORDER — PROMETHAZINE HCL 25 MG/ML IJ SOLN
INTRAMUSCULAR | Status: AC
  Filled 2018-08-07: qty 1

## 2018-08-07 MED ORDER — MINERAL OIL PO OIL
TOPICAL_OIL | ORAL | Status: AC
  Filled 2018-08-07: qty 30

## 2018-08-07 MED ORDER — SODIUM CHLORIDE 0.9 % IV SOLN
INTRAVENOUS | Status: DC
  Administered 2018-08-07: 08:00:00 via INTRAVENOUS

## 2018-08-07 MED ORDER — MIDAZOLAM HCL 5 MG/5ML IJ SOLN
INTRAMUSCULAR | Status: DC | PRN
  Administered 2018-08-07 (×4): 2 mg via INTRAVENOUS

## 2018-08-07 MED ORDER — LIDOCAINE VISCOUS HCL 2 % MT SOLN
OROMUCOSAL | Status: DC | PRN
  Administered 2018-08-07: 4 mL via OROMUCOSAL

## 2018-08-07 MED ORDER — MEPERIDINE HCL 100 MG/ML IJ SOLN
INTRAMUSCULAR | Status: DC | PRN
  Administered 2018-08-07: 25 mg via INTRAVENOUS
  Administered 2018-08-07: 50 mg via INTRAVENOUS
  Administered 2018-08-07 (×2): 25 mg via INTRAVENOUS

## 2018-08-07 MED ORDER — LIDOCAINE VISCOUS HCL 2 % MT SOLN
OROMUCOSAL | Status: AC
  Filled 2018-08-07: qty 15

## 2018-08-07 NOTE — Discharge Instructions (Signed)
You have mild gastritis due to IBUPROFEN. YOUR SMALL BOWEL LOOKED NORMAL. I biopsied your stomach.   DRINK WATER TO KEEP YOUR URINE LIGHT YELLOW.  CONTINUE LOW FODMAP/VEGETARIAN DIET.  SEE HANDOUT.  CONTINUE FIBER GUMMIES.   USE STOOL SOFTENER IF NEEDED TO HAVE 2-3 BMs A WEEK.  CONTINUE PROBIOTIC DAILY.  YOUR BIOPSY RESULTS WILL BE BACK IN 5 BUSINESS DAYS.  FOLLOW UP IN 4 MOS.  UPPER ENDOSCOPY AFTER CARE Read the instructions outlined below and refer to this sheet in the next week. These discharge instructions provide you with general information on caring for yourself after you leave the hospital. While your treatment has been planned according to the most current medical practices available, unavoidable complications occasionally occur. If you have any problems or questions after discharge, call DR. Ayerim Berquist, [phone].  ACTIVITY  You may resume your regular activity, but move at a slower pace for the next 24 hours.   Take frequent rest periods for the next 24 hours.   Walking will help get rid of the air and reduce the bloated feeling in your belly (abdomen).   No driving for 24 hours (because of the medicine (anesthesia) used during the test).   You may shower.   Do not sign any important legal documents or operate any machinery for 24 hours (because of the anesthesia used during the test).    NUTRITION  Drink plenty of fluids.   You may resume your normal diet as instructed by your doctor.   Begin with a light meal and progress to your normal diet. Heavy or fried foods are harder to digest and may make you feel sick to your stomach (nauseated).   Avoid alcoholic beverages for 24 hours or as instructed.    MEDICATIONS  You may resume your normal medications.   WHAT YOU CAN EXPECT TODAY  Some feelings of bloating in the abdomen.   Passage of more gas than usual.    IF YOU HAD A BIOPSY TAKEN DURING THE UPPER ENDOSCOPY:  Eat a soft diet IF YOU HAVE  NAUSEA, BLOATING, ABDOMINAL PAIN, OR VOMITING.    FINDING OUT THE RESULTS OF YOUR TEST Not all test results are available during your visit. DR. Darrick Penna WILL CALL YOU WITHIN 14 DAYS OF YOUR PROCEDUE WITH YOUR RESULTS. Do not assume everything is normal if you have not heard from DR. Lachrisha Ziebarth, CALL HER OFFICE AT [phone].  SEEK IMMEDIATE MEDICAL ATTENTION AND CALL THE OFFICE: [phone] IF:  You have more than a spotting of blood in your stool.   Your belly is swollen (abdominal distention).   You are nauseated or vomiting.   You have a temperature over 101F.   You have abdominal pain or discomfort that is severe or gets worse throughout the day.   Gastritis  Gastritis is an inflammation (the body's way of reacting to injury and/or infection) of the stomach. It is often caused by viral or bacterial (germ) infections. It can also be caused BY ASPIRIN, BC/GOODY POWDER'S, (IBUPROFEN) MOTRIN, OR ALEVE (NAPROXEN), chemicals (including alcohol), SPICY FOODS, and medications. This illness may be associated with generalized malaise (feeling tired, not well), UPPER ABDOMINAL STOMACH cramps, and fever. One common bacterial cause of gastritis is an organism known as H. Pylori. This can be treated with antibiotics.

## 2018-08-07 NOTE — Op Note (Addendum)
[facility] Patient Name: Deborah Riddle Procedure Date: 08/07/2018 7:23 AM MRN: [id number] Date of Birth: 06/30/1986 Attending MD: Jonette EvaSandi Carolee Channell MD, MD CSN: [id number] Age: [age] Admit Type: Outpatient Procedure:                Upper GI endoscopy WITH COLD FORCEPS BIOPSY Indications:              Dyspepsia, Dysphagia. USEUS IBUPROFEN. Providers:                Jonette EvaSandi Kastiel Simonian MD, MD, Jannett CelestineAnitra Bell, RN, Edrick Kinsammy Vaught,                            RN Referring MD:             Ignatius Speckinghruv B. Vyas Medicines:                Meperidine 125 mg IV, Midazolam 8 mg IV,                            Promethazine 12.5 mg IV Complications:            No immediate complications. Estimated Blood Loss:     Estimated blood loss was minimal. Procedure:                Pre-Anesthesia Assessment:                           - Prior to the procedure, a History and Physical                            was performed, and patient medications and                            allergies were reviewed. The patient's tolerance of                            previous anesthesia was also reviewed. The risks                            and benefits of the procedure and the sedation                            options and risks were discussed with the patient.                            All questions were answered, and informed consent                            was obtained. Prior Anticoagulants: The patient has                            taken ibuprofen, last dose was 7 days prior to                            procedure. ASA Grade Assessment: II - A patient  with mild systemic disease. After reviewing the                            risks and benefits, the patient was deemed in                            satisfactory condition to undergo the procedure.                            After obtaining informed consent, the endoscope was                            passed under direct vision. Throughout the                 procedure, the patient's blood pressure, pulse, and                            oxygen saturations were monitored continuously. The                            [id number]-[id number] ([id number] scope was introduced through the                            mouth, and advanced to the duodenal bulb. The upper                            GI endoscopy was accomplished without difficulty.                            The patient tolerated the procedure well. Scope In: 9:02:26 AM Scope Out: 9:09:35 AM Total Procedure Duration: 0 hours 7 minutes 9 seconds  Findings:      The examined esophagus was normal.      Segmental mild inflammation characterized by congestion (edema) and       erythema was found in the gastric antrum. Biopsies were taken with a       cold forceps for Helicobacter pylori testing.      The examined duodenum was normal. Impression:               .                           - MILD Gastritis DUE TO IBUPROFEN Moderate Sedation:      Moderate (conscious) sedation was administered by the endoscopy nurse       and supervised by the endoscopist. The following parameters were       monitored: oxygen saturation, heart rate, blood pressure, and response       to care. Total physician intraservice time was 28 minutes. Recommendation:           - Patient has a contact number available for                            emergencies. The signs and symptoms of potential  delayed complications were discussed with the                            patient. Return to normal activities tomorrow.                            Written discharge instructions were provided to the                            patient.                           - FOLLOW A FODMAP/VEGETARIAN DIET.                           - Continue present medications.                           - Return to my office in 3 months. NEXT ENDOSCOPY                            WITH PROPFOFOL. Procedure Code(s):        ---  Professional ---                           [phone], Esophagogastroduodenoscopy, flexible,                            transoral; with biopsy, single or multiple                           99153, Moderate sedation; each additional 15                            minutes intraservice time                           G0500, Moderate sedation services provided by the                            same physician or other qualified health care                            professional performing a gastrointestinal                            endoscopic service that sedation supports,                            requiring the presence of an independent trained                            observer to assist in the monitoring of the                            patient's level of consciousness and physiological  status; initial 15 minutes of intra-service time;                            patient age 91 years or older (additional time may                            be reported with 07371, as appropriate) Diagnosis Code(s):        --- Professional ---                           K29.70, Gastritis, unspecified, without bleeding                           R10.13, Epigastric pain                           R13.10, Dysphagia, unspecified CPT copyright 2018 [employer]. All rights reserved. The codes documented in this report are preliminary and upon coder review may  be revised to meet current compliance requirements. Jonette Eva, MD Jonette Eva MD, MD 08/07/2018 9:23:19 AM This report has been signed electronically. Number of Addenda: 0

## 2018-08-07 NOTE — H&P (Signed)
Primary Care Physician:  Ignatius SpeckingVyas, Dhruv B, MD Primary Gastroenterologist:  Dr. Darrick PennaFields  Pre-Procedure History & Physical: HPI:  Deborah Riddle is a 33 y.o. [facility]  Past Medical History:  Diagnosis Date  . Body mass index 40.0-44.9, adult (HCC)   . Chest pain, unspecified   . Dysrhythmia    AFib  . GERD (gastroesophageal reflux disease)   . Morbid obesity (HCC)   . Paroxysmal atrial fibrillation (HCC)   . Polycystic disease, ovaries   . Sickle cell trait (HCC)   . Tobacco use disorder     Past Surgical History:  Procedure Laterality Date  . HYDRADENITIS EXCISION Right 05/29/2018   Procedure: EXCISION HIDRADENITIS AXILLA;  Surgeon: Lucretia RoersBridges, Lindsay C, MD;  Location: [facility];  Service: General;  Laterality: Right;  . none    . WISDOM TOOTH EXTRACTION      Prior to Admission medications   Medication Sig Start Date End Date Taking? Authorizing Provider  acetaminophen (TYLENOL) 500 MG tablet Take 500 mg by mouth every 6 (six) hours as needed for mild pain or moderate pain.   Yes [provider]  docusate sodium (COLACE) 100 MG capsule Take 1 capsule (100 mg total) by mouth 2 (two) times daily. Patient taking differently: Take 100 mg by mouth daily as needed for mild constipation or moderate constipation.  05/29/18 05/29/19 Yes Lucretia RoersBridges, Lindsay C, MD  FIBER ADULT GUMMIES PO Take 2 tablets by mouth at bedtime.    Yes [provider]  ibuprofen (ADVIL,MOTRIN) 200 MG tablet Take 400 mg by mouth daily.   Yes [provider]  pantoprazole (PROTONIX) 40 MG tablet TAKE 1 TABLET BY MOUTH 30 MINS PRIOR TO FIRST MEAL 08/06/18  Yes Tiffany KocherLewis, Leslie S, PA-C  polyethylene glycol (MIRALAX / GLYCOLAX) packet Take 17 g by mouth daily as needed for mild constipation or moderate constipation. 07/15/18  Yes Loren RacerYelverton, David, MD  Probiotic Product (PROBIOTIC DAILY PO) Take 1 tablet by mouth at bedtime.    Yes [provider]  Vitamin D,  Ergocalciferol, (DRISDOL) 50000 UNITS CAPS capsule Take 50,000 Units by mouth every Saturday.  04/22/14  Yes [provider]  dicyclomine (BENTYL) 20 MG tablet Take 1 tablet (20 mg total) by mouth 2 (two) times daily as needed for spasms. 07/15/18   Loren RacerYelverton, David, MD  ondansetron (ZOFRAN) 4 MG tablet 1 PO 30 MINUTES PRIOR TO MEALS TID AND AT BEDTIME Patient not taking: Reported on 07/15/2018 06/24/18   West BaliFields, Teshaun Olarte L, MD  metoprolol tartrate (LOPRESSOR) 25 MG tablet Take 25 mg by mouth daily as needed. For racing heart  09/30/11  [provider]    Allergies as of 06/24/2018 - Review Complete 06/24/2018  Allergen Reaction Noted  . Dificid [fidaxomicin] Shortness Of Breath 05/13/2018  . Amoxicillin Swelling 01/25/2018  . Clindamycin/lincomycin  05/13/2018  . Heparin Other (See Comments) 09/21/2011    Family History  Problem Relation Age of Onset  . Arrhythmia Mother 4350       History of Cardiac Stents  . Hypertension Mother   . Diabetes Mother   . COPD Mother   . Colon polyps Mother   . Hypertension Father   . Diabetes Other        Family History  . Hypertension Other        Family History  . Diabetes Sister   . Hypertension Sister   . Colon cancer Maternal Uncle   . Crohn's disease Cousin     Social History  Socioeconomic History  . Marital status: Married    Spouse name: CHRISTOPHER   . Number of children: Not on file  . Years of education: Not on file  . Highest education level: Not on file  Occupational History  . Occupation: Research scientist (life sciences): [employer]  Social Needs  . Financial resource strain: Not on file  . Food insecurity:    Worry: Not on file    Inability: Not on file  . Transportation needs:    Medical: Not on file    Non-medical: Not on file  Tobacco Use  . Smoking status: Former Smoker    Packs/day: 1.00    Years: 12.00    Pack years: 12.00    Types: Cigarettes    Start date: 01/21/2004    Last attempt to quit:  09/10/2011    Years since quitting: 6.9  . Smokeless tobacco: Never Used  Substance and Sexual Activity  . Alcohol use: Never    Alcohol/week: 0.0 standard drinks    Frequency: Never  . Drug use: No  . Sexual activity: Yes  Lifestyle  . Physical activity:    Days per week: Not on file    Minutes per session: Not on file  . Stress: Not on file  Relationships  . Social connections:    Talks on phone: Not on file    Gets together: Not on file    Attends religious service: Not on file    Active member of club or organization: Not on file    Attends meetings of clubs or organizations: Not on file    Relationship status: Not on file  . Intimate partner violence:    Fear of current or ex partner: Not on file    Emotionally abused: Not on file    Physically abused: Not on file    Forced sexual activity: Not on file  Other Topics Concern  . Not on file  Social History Narrative   Lives in [place], [place], [place] with husband.  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    Review of Systems: See HPI, otherwise negative ROS   Physical Exam: BP 134/90   Pulse 92   Temp 99.1 F (37.3 C) (Oral)   Resp 16   Ht 5\' 8"  (1.727 m)   Wt 127.9 kg   SpO2 96%   BMI 42.88 kg/m  General:   Alert,  pleasant and cooperative in NAD Head:  Normocephalic and atraumatic. Neck:  Supple; Lungs:  Clear throughout to auscultation.    Heart:  Regular rate and rhythm. Abdomen:  Soft, nontender and nondistended. Normal bowel sounds, without guarding, and without rebound.   Neurologic:  Alert and  oriented x4;  grossly normal neurologically.  Impression/Plan:    DYSPHAGIA/dyspepsia  PLAN:  EGD/?DIL TODAY. DISCUSSED PROCEDURE, BENEFITS, & RISKS: < 1% chance of medication reaction, bleeding, OR perforation.

## 2018-08-12 ENCOUNTER — Encounter (HOSPITAL_COMMUNITY): Payer: Self-pay | Admitting: Gastroenterology

## 2018-08-12 NOTE — Progress Notes (Signed)
PT is aware.

## 2018-11-11 ENCOUNTER — Ambulatory Visit: Payer: BLUE CROSS/BLUE SHIELD | Admitting: Gastroenterology

## 2018-11-13 ENCOUNTER — Ambulatory Visit: Payer: BLUE CROSS/BLUE SHIELD | Admitting: Gastroenterology

## 2018-11-27 ENCOUNTER — Emergency Department (HOSPITAL_COMMUNITY): Payer: BLUE CROSS/BLUE SHIELD

## 2018-11-27 ENCOUNTER — Encounter (HOSPITAL_COMMUNITY): Payer: Self-pay | Admitting: Emergency Medicine

## 2018-11-27 ENCOUNTER — Other Ambulatory Visit: Payer: Self-pay

## 2018-11-27 ENCOUNTER — Emergency Department (HOSPITAL_COMMUNITY)
Admission: EM | Admit: 2018-11-27 | Discharge: 2018-11-27 | Disposition: A | Discharge status: Home / Self Care | Payer: BLUE CROSS/BLUE SHIELD | Attending: Emergency Medicine | Admitting: Emergency Medicine

## 2018-11-27 DIAGNOSIS — R05 Cough: Secondary | ICD-10-CM | POA: Diagnosis not present

## 2018-11-27 DIAGNOSIS — Z20828 Contact with and (suspected) exposure to other viral communicable diseases: Secondary | ICD-10-CM | POA: Diagnosis not present

## 2018-11-27 DIAGNOSIS — Z87891 Personal history of nicotine dependence: Secondary | ICD-10-CM | POA: Insufficient documentation

## 2018-11-27 DIAGNOSIS — R091 Pleurisy: Secondary | ICD-10-CM

## 2018-11-27 DIAGNOSIS — Z79899 Other long term (current) drug therapy: Secondary | ICD-10-CM | POA: Diagnosis not present

## 2018-11-27 DIAGNOSIS — R059 Cough, unspecified: Secondary | ICD-10-CM

## 2018-11-27 DIAGNOSIS — R079 Chest pain, unspecified: Secondary | ICD-10-CM | POA: Diagnosis present

## 2018-11-27 MED ORDER — BENZONATATE 100 MG PO CAPS: 100.0000 mg | ORAL_CAPSULE | Freq: Three times a day (TID) | ORAL | 0 refills | Status: DC

## 2018-11-27 MED ORDER — AEROCHAMBER PLUS FLO-VU MEDIUM MISC
1.0000 | Freq: Once | Status: AC
  Administered 2018-11-27: 18:00:00 1
  Filled 2018-11-27: qty 1

## 2018-11-27 MED ORDER — PROMETHAZINE-DM 6.25-15 MG/5ML PO SYRP: 5.0000 mL | ORAL_SOLUTION | Freq: Four times a day (QID) | ORAL | 0 refills | Status: DC | PRN

## 2018-11-27 MED ORDER — ALBUTEROL SULFATE HFA 108 (90 BASE) MCG/ACT IN AERS
2.0000 | INHALATION_SPRAY | RESPIRATORY_TRACT | Status: AC
  Administered 2018-11-27: 2 via RESPIRATORY_TRACT
  Filled 2018-11-27: qty 6.7

## 2018-11-27 NOTE — Discharge Instructions (Signed)
Your x-ray today shows no signs of infection, pneumonia or other abnormalities.  Your EKG is normal showing no signs of heart problems and your coronavirus test has been sent.  You will find out within the next 2 days if it was positive or negative.  You will need to stay out of work until you are feeling better, coughing less and having no fevers or shortness of breath.  Please continue to take Tessalon, 100 mg every 8 hours as needed for coughing, alternatively you may try promethazine dextromethorphan, 5 mL by mouth every 8 hours as needed however this medication is much more sedating and should not be used if you are driving or working.  Take albuterol inhaler, 2 puffs every 4 hours as needed for coughing wheezing or shortness of breath.  Seek medical exam for severe or worsening symptoms.

## 2018-11-27 NOTE — ED Provider Notes (Signed)
[facility] EMERGENCY DEPARTMENT Provider Note   CSN: [id number] Arrival date & time: 11/27/18  1534    History   Chief Complaint Chief Complaint  Patient presents with  . Chest Pain    HPI Deborah Riddle is a 33 y.o. female.     HPI  33 year old female, she has no significant past medical history other than smoking cigarettes, obesity, acid reflux and paroxysmal atrial fibrillation.  She presents to the hospital today complaining of chest pain with a cough.  She reports that she started coughing approximately 1 month ago, she was treated with a course of prednisone and then [employer] and improved for the last couple of weeks however 1 week ago she started having symptoms again with increasing coughing and now having a central chest discomfort which seems to be both in the front and the back.  She has no fevers, no swelling of the legs, no other risk factors for pulmonary embolism.  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  She denies having any fevers, denies any abdominal discomfort, states that she was seen by her physician by telemedicine appointment a week ago and ultimately at this point comes in for further evaluation because of ongoing coughing.  The chest pain is only when she coughs.  Past Medical History:  Diagnosis Date  . Body mass index 40.0-44.9, adult (HCC)   . Chest pain, unspecified   . Dysrhythmia    AFib  . GERD (gastroesophageal reflux disease)   . Morbid obesity (HCC)   . Paroxysmal atrial fibrillation (HCC)   . Polycystic disease, ovaries   . Sickle cell trait (HCC)   . Tobacco use disorder     Patient Active Problem List   Diagnosis Date Noted  . Dyspepsia   . Wound dehiscence 06/18/2018  . Hidradenitis 05/19/2018  . Abdominal pain 05/13/2018  . Morbid obesity (HCC) 12/22/2014  . Chest pain 09/23/2011  . Paroxysmal atrial fibrillation (HCC)   . Palpitations 03/23/2009    Past Surgical  History:  Procedure Laterality Date  . BIOPSY  08/07/2018   Procedure: BIOPSY;  Surgeon: West Bali, MD;  Location: [facility];  Service: Endoscopy;;  gastric bx's  . ESOPHAGOGASTRODUODENOSCOPY N/A 08/07/2018   Procedure: ESOPHAGOGASTRODUODENOSCOPY (EGD);  Surgeon: West Bali, MD;  Location: [facility];  Service: Endoscopy;  Laterality: N/A;  3:00pm  . HYDRADENITIS EXCISION Right 05/29/2018   Procedure: EXCISION HIDRADENITIS AXILLA;  Surgeon: Lucretia Roers, MD;  Location: [facility];  Service: General;  Laterality: Right;  . MALONEY DILATION N/A 08/07/2018   Procedure: Elease Hashimoto DILATION;  Surgeon: West Bali, MD;  Location: [facility];  Service: Endoscopy;  Laterality: N/A;  . none    . SAVORY DILATION N/A 08/07/2018   Procedure: SAVORY DILATION;  Surgeon: West Bali, MD;  Location: [facility];  Service: Endoscopy;  Laterality: N/A;  . WISDOM TOOTH EXTRACTION       OB History    Gravida      Para      Term      Preterm      AB      Living  0     SAB      TAB      Ectopic      Multiple      Live Births               Home Medications  Prior to Admission medications   Medication Sig Start Date End Date Taking? Authorizing Provider  acetaminophen (TYLENOL) 500 MG tablet Take 500 mg by mouth every 6 (six) hours as needed for mild pain or moderate pain.    [provider]  benzonatate (TESSALON) 100 MG capsule Take 1 capsule (100 mg total) by mouth every 8 (eight) hours. 11/27/18   Eber Hong, MD  FIBER ADULT GUMMIES PO Take 2 tablets by mouth at bedtime.     [provider]  Probiotic Product (PROBIOTIC DAILY PO) Take 1 tablet by mouth at bedtime.     [provider]  promethazine-dextromethorphan (PROMETHAZINE-DM) 6.25-15 MG/5ML syrup Take 5 mLs by mouth 4 (four) times daily as needed for cough. 11/27/18   Eber Hong, MD  Vitamin D, Ergocalciferol, (DRISDOL) 50000 UNITS CAPS capsule Take 50,000 Units by  mouth every Saturday.  04/22/14   [provider]  metoprolol tartrate (LOPRESSOR) 25 MG tablet Take 25 mg by mouth daily as needed. For racing heart  09/30/11  [provider]    Family History Family History  Problem Relation Age of Onset  . Arrhythmia Mother 53       History of Cardiac Stents  . Hypertension Mother   . Diabetes Mother   . COPD Mother   . Colon polyps Mother   . Hypertension Father   . Diabetes Other        Family History  . Hypertension Other        Family History  . Diabetes Sister   . Hypertension Sister   . Colon cancer Maternal Uncle   . Crohn's disease Cousin     Social History Social History   Tobacco Use  . Smoking status: Former Smoker    Packs/day: 1.00    Years: 12.00    Pack years: 12.00    Types: Cigarettes    Start date: 01/21/2004    Last attempt to quit: 09/10/2011    Years since quitting: 7.2  . Smokeless tobacco: Never Used  Substance Use Topics  . Alcohol use: Never    Alcohol/week: 0.0 standard drinks    Frequency: Never  . Drug use: No     Allergies   Dificid [fidaxomicin]; Amoxicillin; Clindamycin/lincomycin; and Heparin   Review of Systems Review of Systems  All other systems reviewed and are negative.    Physical Exam Updated Vital Signs BP (!) 134/117   Pulse 85   Temp 98.1 F (36.7 C) (Oral)   Resp 15   Ht 1.727 m ( )   Wt 131.5 kg   LMP 11/06/2018 (Exact Date)   SpO2 99%   BMI 44.09 kg/m   Physical Exam Vitals signs and nursing note reviewed.  Constitutional:      General: She is not in acute distress.    Appearance: She is well-developed.  HENT:     Head: Normocephalic and atraumatic.     Mouth/Throat:     Pharynx: No oropharyngeal exudate.  Eyes:     General: No scleral icterus.       Right eye: No discharge.        Left eye: No discharge.     Conjunctiva/sclera: Conjunctivae normal.     Pupils: Pupils are equal, round, and reactive to light.  Neck:      Musculoskeletal: Normal range of motion and neck supple.     Thyroid: No thyromegaly.     Vascular: No JVD.  Cardiovascular:     Rate and Rhythm:  Normal rate and regular rhythm.     Heart sounds: Normal heart sounds. No murmur. No friction rub. No gallop.   Pulmonary:     Effort: Pulmonary effort is normal. No respiratory distress.     Breath sounds: Normal breath sounds. No wheezing or rales.  Abdominal:     General: Bowel sounds are normal. There is no distension.     Palpations: Abdomen is soft. There is no mass.     Tenderness: There is no abdominal tenderness.  Musculoskeletal: Normal range of motion.        General: No tenderness.  Lymphadenopathy:     Cervical: No cervical adenopathy.  Skin:    General: Skin is warm and dry.     Findings: No erythema or rash.  Neurological:     Mental Status: She is alert.     Coordination: Coordination normal.  Psychiatric:        Behavior: Behavior normal.      ED Treatments / Results  Labs (all labs ordered are listed, but only abnormal results are displayed) Labs Reviewed  NOVEL CORONAVIRUS, NAA (HOSPITAL ORDER, SEND-OUT TO REF LAB)    EKG EKG Interpretation  Date/Time:  Friday Nov 27 2018 15:54:11 EDT Ventricular Rate:  76 PR Interval:  160 QRS Duration: 86 QT Interval:  378 QTC Calculation: 425 R Axis:   4 Text Interpretation:  Normal sinus rhythm Cannot rule out Anterior infarct , age undetermined Abnormal ECG Since last tracing no significant changes seen Confirmed by Eber HongMiller, Korde Jeppsen ([id number]) on 11/27/2018 4:51:13 PM   Radiology Dg Chest Port 1 View  Result Date: 11/27/2018 CLINICAL DATA:  Intermittent chest pain and dry cough for 2 weeks. EXAM: PORTABLE CHEST 1 VIEW COMPARISON:  Single-view of the chest 02/17/2014. PA and lateral chest 09/23/2013. FINDINGS: Lungs clear. Heart size normal. No pneumothorax or pleural fluid. No acute or focal bony abnormality. IMPRESSION: Negative chest. Electronically Signed   By: Drusilla Kannerhomas   Dalessio M.D.   On: 11/27/2018 17:35    Procedures Procedures (including critical care time)  Medications Ordered in ED Medications  albuterol (VENTOLIN HFA) 108 (90 Base) MCG/ACT inhaler 2 puff (has no administration in time range)  AeroChamber Plus Flo-Vu Medium MISC 1 each (has no administration in time range)     Initial Impression / Assessment and Plan / ED Course  I have reviewed the triage vital signs and the nursing notes.  Pertinent labs & imaging results that were available during my care of the patient were reviewed by me and considered in my medical decision making (see chart for details).  Clinical Course as of Nov 26 1798  Fri Nov 27, 2018  1756 The patient's coronavirus test has been sent, her x-ray is totally normal without any signs of infiltrate or pneumothorax.  Her heart rate is 85, oxygen is 100% and she is afebrile.  Her EKG shows no signs of acute ischemia, her blood pressure is slightly elevated however on arrival it was 132/80.  I suspect that this is partly situational and she can follow-up in the outpatient setting to have this rechecked.  She will be treated with a combination of albuterol, prescription cough medications and I will have her follow-up in the outpatient setting, she is agreeable.  The chest pain is likely from some pleurisy   [BM]    Clinical Course User Index [BM] Eber HongMiller, Rhyleigh Grassel, MD       The patient is actually well appearing, she speaks in full sentences without any difficulty  with normal lung sounds, she has no tenderness over the chest, no murmurs, no tachycardia and no edema of her legs.  Pulmonary embolism seems unlikely especially given oxygen level of 100% without any tachycardia and afebrile.  This could be related to bronchitis, could be a slight pneumothorax, would also get her chest wall pain as well.  Will test for coronavirus with the 48-hour turnaround test.  Anticipate discharge, no indication for further lab work as her EKG is  reassuring, and shows no signs of ischemia  Final Clinical Impressions(s) / ED Diagnoses   Final diagnoses:  Cough  Pleurisy    ED Discharge Orders         Ordered    benzonatate (TESSALON) 100 MG capsule  Every 8 hours     11/27/18 1758    promethazine-dextromethorphan (PROMETHAZINE-DM) 6.25-15 MG/5ML syrup  4 times daily PRN     11/27/18 Debera Lat, MD 11/27/18 1800

## 2018-11-27 NOTE — ED Triage Notes (Signed)
Chest pain last (intermittent) and "feels like something in my throat).  Non-productive cough for last 2 weeks and treated with steroids and it cleared up.  Having SOB at times today.  Denies any other issues.

## 2018-11-28 LAB — NOVEL CORONAVIRUS, NAA (HOSP ORDER, SEND-OUT TO REF LAB; TAT 18-24 HRS): SARS-CoV-2, NAA: NOT DETECTED

## 2018-12-04 ENCOUNTER — Telehealth: Payer: Self-pay | Admitting: Gastroenterology

## 2018-12-04 NOTE — Telephone Encounter (Addendum)
ERROR

## 2018-12-08 NOTE — Telephone Encounter (Signed)
TC WAS MEANT FOR PT'S MOTHER [name].

## 2019-07-22 IMAGING — DX DG FOOT COMPLETE 3+V*L*
3 series · 3 of 3 positions shown · non-contrast
Comparison: Left 2nd toe images today

CLINICAL DATA: Left 2nd toe injury, pain

EXAM:
LEFT FOOT - COMPLETE 3+ VIEW

[foot ap]
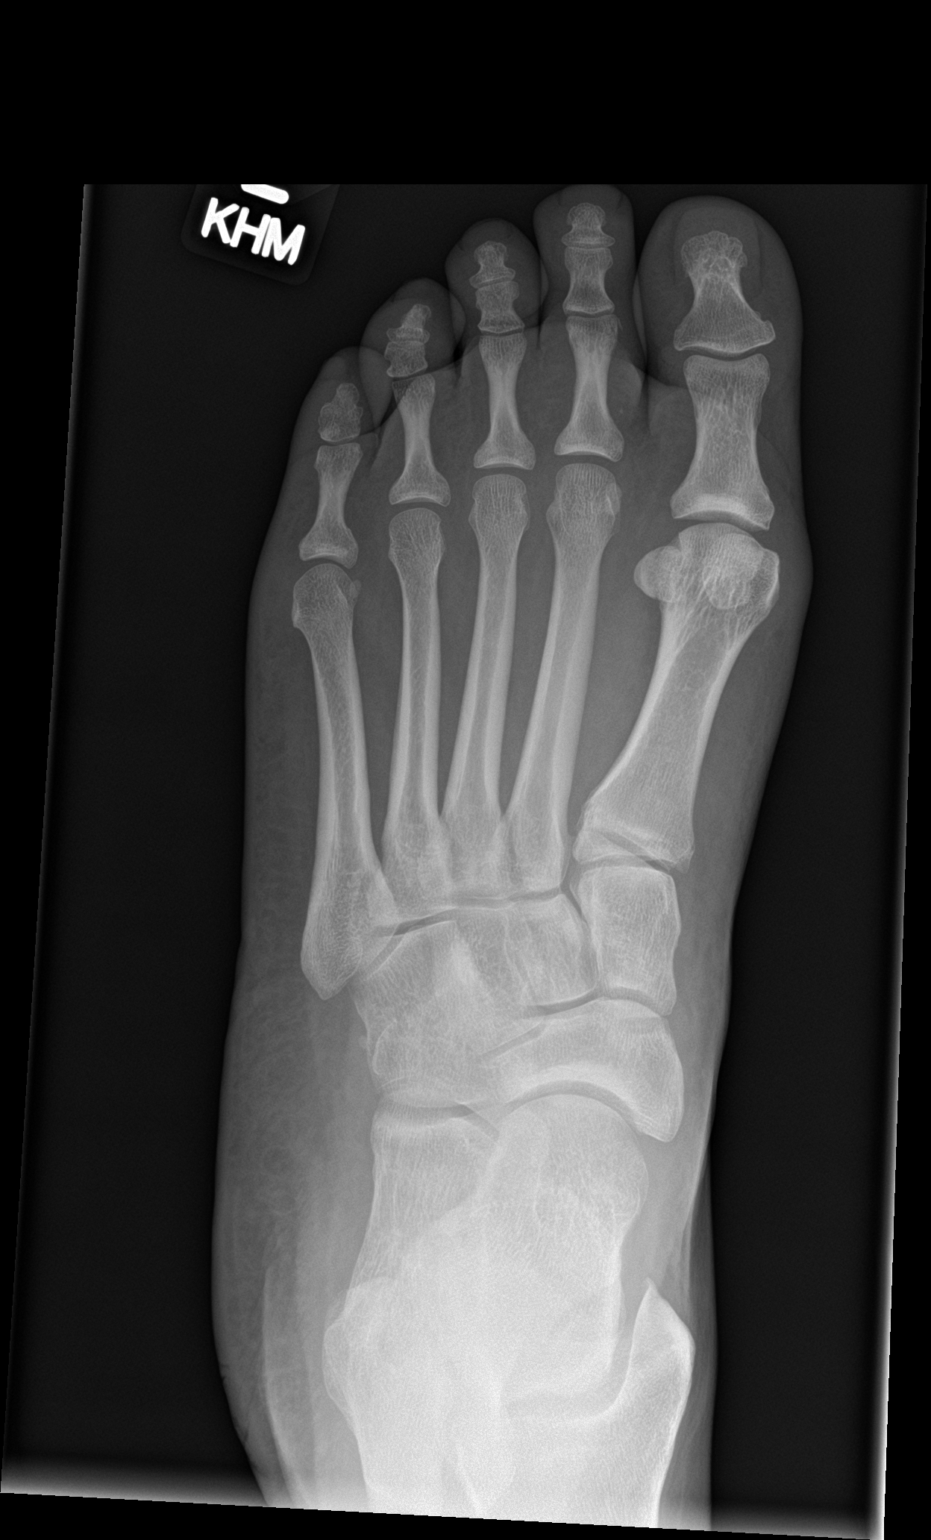

[foot obl]
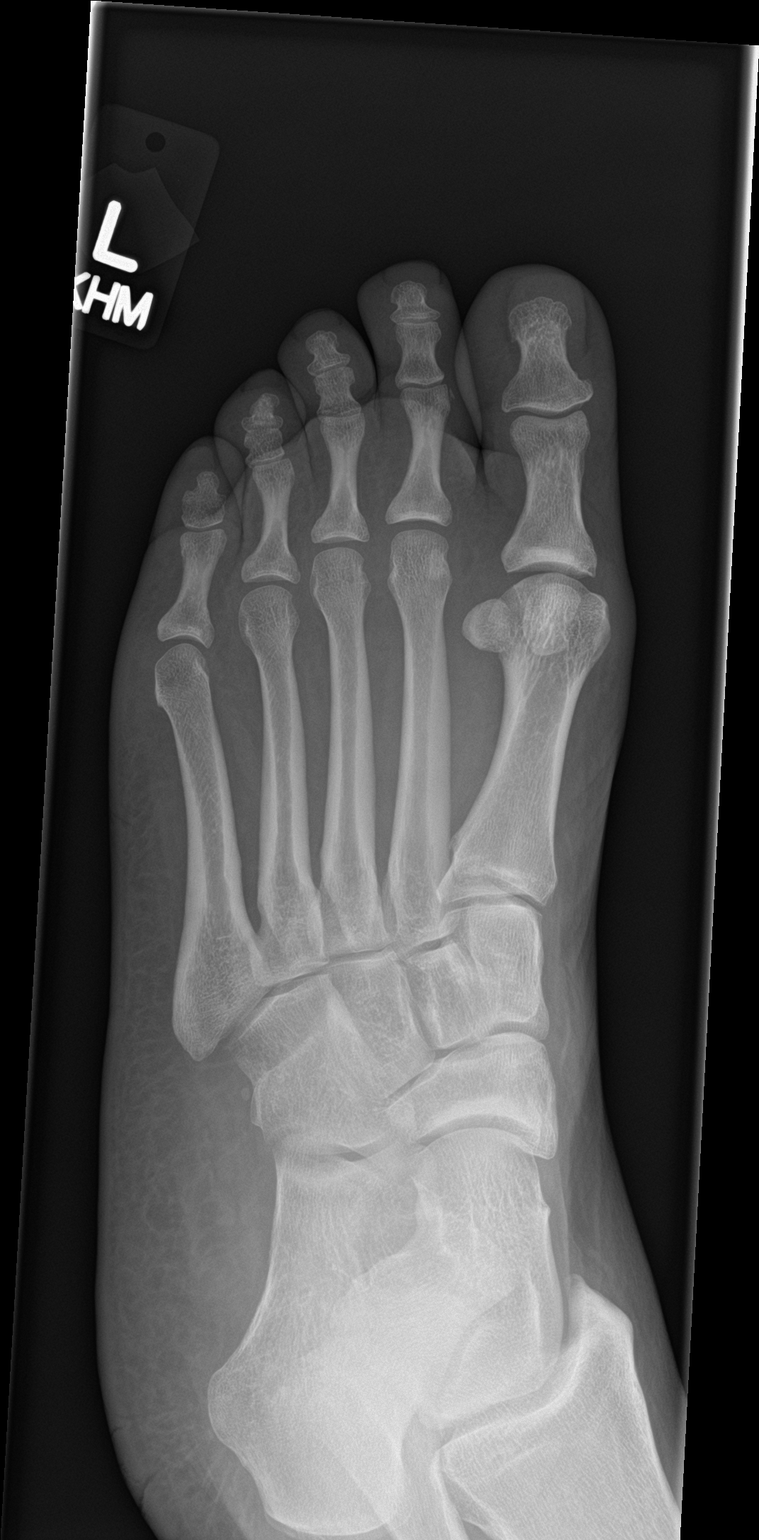

[foot lat]
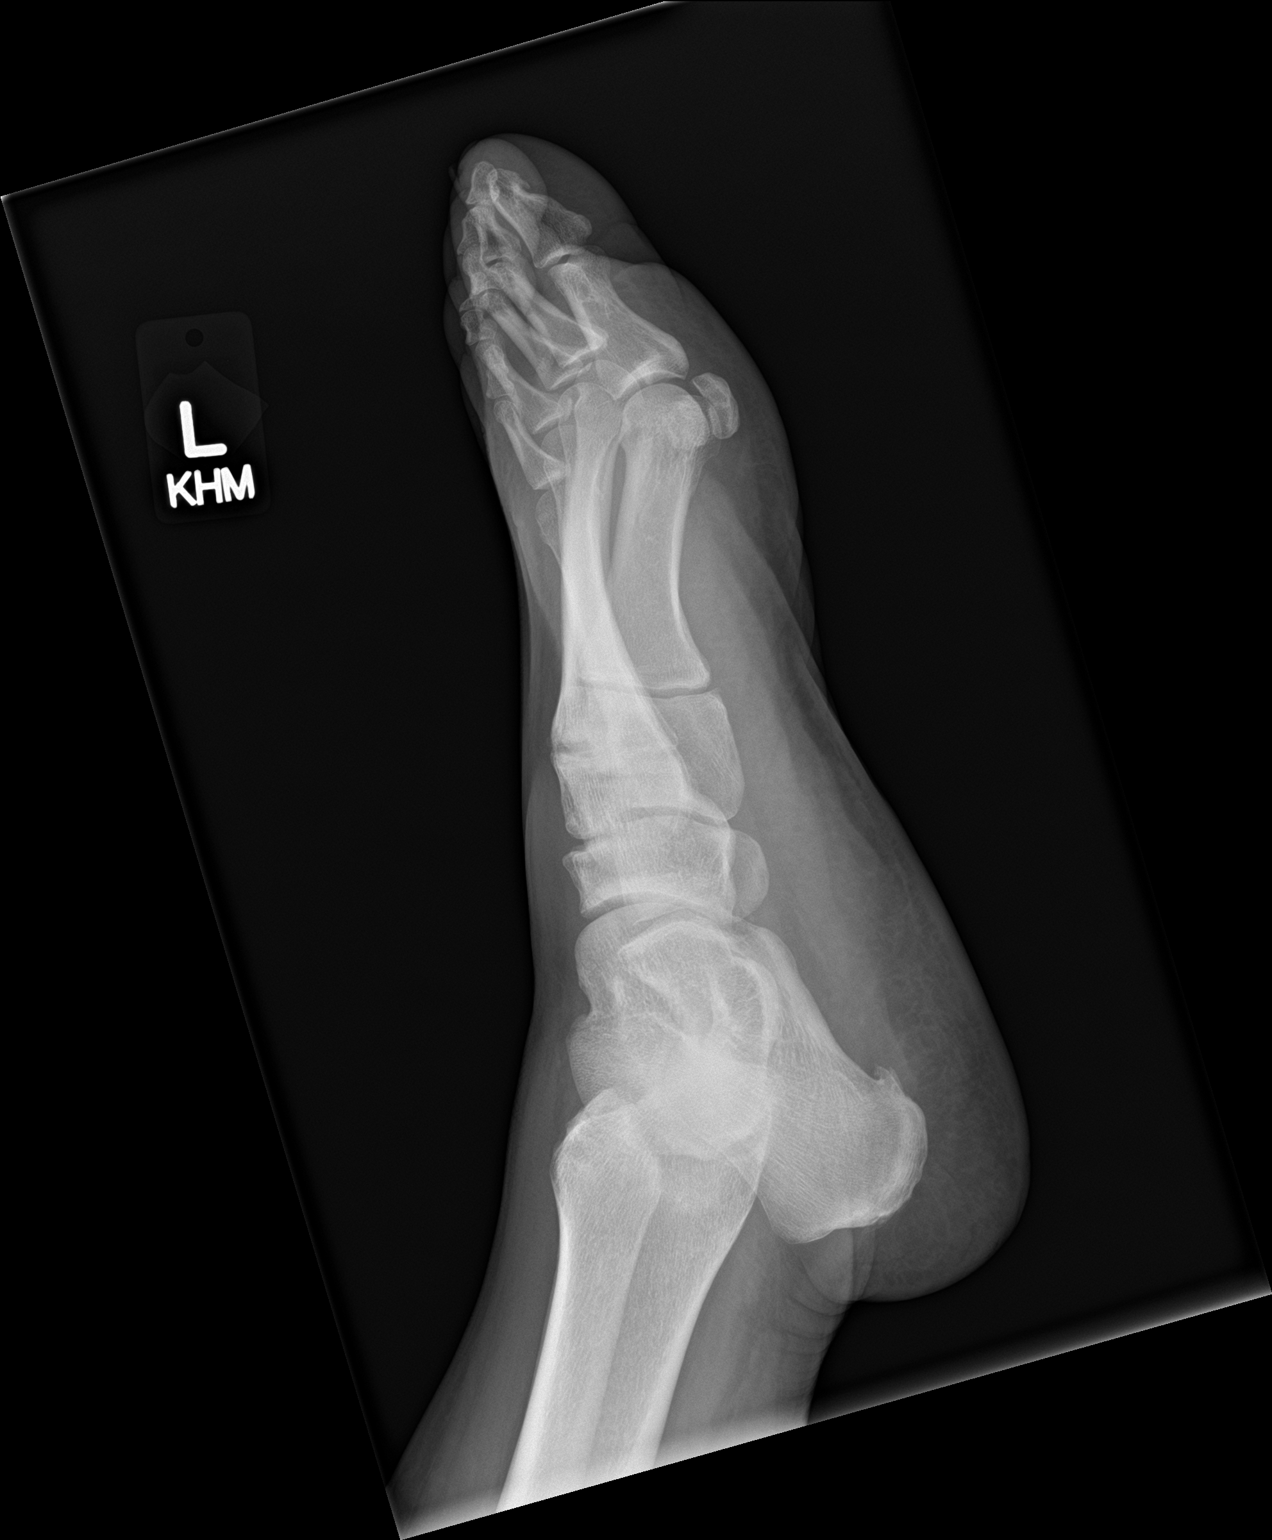

[3 of 3 positions shown; findings below may reference images not displayed]

FINDINGS: Small avulsed fragment likely along the medial surface of the left
2nd toe proximal phalanx along its distal surface near the PIP
joint. No additional acute bony abnormality. No subluxation or
dislocation.
IMPRESSION: Probable small avulsed fragment off the medial distal aspect of the
left 2nd toe proximal phalanx.

## 2019-12-01 ENCOUNTER — Emergency Department (HOSPITAL_COMMUNITY)
Admission: EM | Admit: 2019-12-01 | Discharge: 2019-12-02 | Disposition: A | Discharge status: Home / Self Care | Payer: BC Managed Care – PPO | Attending: Emergency Medicine | Admitting: Emergency Medicine

## 2019-12-01 ENCOUNTER — Emergency Department (HOSPITAL_COMMUNITY): Payer: BC Managed Care – PPO

## 2019-12-01 ENCOUNTER — Other Ambulatory Visit: Payer: Self-pay

## 2019-12-01 ENCOUNTER — Encounter (HOSPITAL_COMMUNITY): Payer: Self-pay | Admitting: Emergency Medicine

## 2019-12-01 DIAGNOSIS — R0789 Other chest pain: Secondary | ICD-10-CM | POA: Diagnosis not present

## 2019-12-01 DIAGNOSIS — R079 Chest pain, unspecified: Secondary | ICD-10-CM

## 2019-12-01 DIAGNOSIS — Z79899 Other long term (current) drug therapy: Secondary | ICD-10-CM | POA: Insufficient documentation

## 2019-12-01 DIAGNOSIS — R5383 Other fatigue: Secondary | ICD-10-CM | POA: Insufficient documentation

## 2019-12-01 DIAGNOSIS — I480 Paroxysmal atrial fibrillation: Secondary | ICD-10-CM | POA: Diagnosis not present

## 2019-12-01 DIAGNOSIS — R0602 Shortness of breath: Secondary | ICD-10-CM | POA: Diagnosis not present

## 2019-12-01 DIAGNOSIS — R002 Palpitations: Secondary | ICD-10-CM | POA: Insufficient documentation

## 2019-12-01 LAB — CBC
HCT: 39.8 % (ref 36.0–46.0)
Hemoglobin: 13.3 g/dL (ref 12.0–15.0)
MCH: 24.9 pg — ABNORMAL LOW (ref 26.0–34.0)
MCHC: 33.4 g/dL (ref 30.0–36.0)
MCV: 74.4 fL — ABNORMAL LOW (ref 80.0–100.0)
Platelets: 240 10*3/uL (ref 150–400)
RBC: 5.35 MIL/uL — ABNORMAL HIGH (ref 3.87–5.11)
RDW: 16.4 % — ABNORMAL HIGH (ref 11.5–15.5)
WBC: 9.7 10*3/uL (ref 4.0–10.5)
nRBC: 0 % (ref 0.0–0.2)

## 2019-12-01 LAB — BASIC METABOLIC PANEL
Anion gap: 8 (ref 5–15)
BUN: 12 mg/dL (ref 6–20)
CO2: 26 mmol/L (ref 22–32)
Calcium: 8.8 mg/dL — ABNORMAL LOW (ref 8.9–10.3)
Chloride: 104 mmol/L (ref 98–111)
Creatinine, Ser: 0.79 mg/dL (ref 0.44–1.00)
GFR calc Af Amer: >60 mL/min (ref >60)
GFR calc non Af Amer: >60 mL/min (ref >60)
Glucose, Bld: 99 mg/dL (ref 70–99)
Potassium: 3.5 mmol/L (ref 3.5–5.1)
Sodium: 138 mmol/L (ref 135–145)

## 2019-12-01 LAB — TROPONIN I (HIGH SENSITIVITY): Troponin I (High Sensitivity): <2 ng/L (ref <18)

## 2019-12-01 MED ORDER — SODIUM CHLORIDE 0.9% FLUSH: 3.0000 mL | Freq: Once | INTRAVENOUS | Status: DC

## 2019-12-01 NOTE — ED Triage Notes (Signed)
Pt c/o intermittent chest tightness since Monday.

## 2019-12-01 NOTE — ED Provider Notes (Signed)
[facility] EMERGENCY DEPARTMENT Provider Note   CSN: [id number] Arrival date & time: 12/01/19  2231   Time seen 11:35 PM  History Chief Complaint  Patient presents with  . Chest Pain    Deborah Riddle is a 34 y.o. female.  HPI Patient states on May 14 she felt faint.  She was standing watching a video and she felt like her heart started racing and it lasted about 3 minutes.  She denies chest pain or diaphoresis but it did make her feel short of breath.  She states it was similar to when she had had episodes of atrial fib in the past but it did not last as long as her atrial fib.  She then felt fine until the 17th when the episode happened again off and on throughout the day about 5 times.  She states each time it only lasted a few minutes.  However this time she did not have chest pain or shortness of breath.  She did have nausea off and on all day.  She also had a feeling that her heart was fluttering her heart was racing.  She states she had a very busy day today and about 10 PM tonight she was sitting on the couch and her chest felt tight like a muscle cramp in the lower central part and slightly on the right side.  It sort of came and went and is currently gone.  She denies having the feeling her heart was racing or feeling short of breath.  She states she had a discomfort in her neck that she described as an ache.  She denies diaphoresis.  She states when she has had this before she would stretch and it go away.  She states she did have dizziness today and felt like things were moving around not like feeling she is got pass out.  She states she did notice movement made the chest pain worse and sitting still made it feel better.  She denies any family history of coronary artery disease but states her mother has irregular heartbeat and and enlarged heart.  She does not smoke.  Patient states she has been feeling tired.  PCP Glenda Chroman, MD     Past Medical History:  Diagnosis Date    . Body mass index 40.0-44.9, adult ([place])   . Chest pain, unspecified   . Dysrhythmia    AFib  . GERD (gastroesophageal reflux disease)   . Morbid obesity ([place])   . Paroxysmal atrial fibrillation (HCC)   . Polycystic disease, ovaries   . Sickle cell trait ([place])   . Tobacco use disorder     Patient Active Problem List   Diagnosis Date Noted  . Dyspepsia   . Wound dehiscence 06/18/2018  . Hidradenitis 05/19/2018  . Abdominal pain 05/13/2018  . Morbid obesity ([place]) 12/22/2014  . Chest pain 09/23/2011  . Paroxysmal atrial fibrillation (HCC)   . Palpitations 03/23/2009    Past Surgical History:  Procedure Laterality Date  . BIOPSY  08/07/2018   Procedure: BIOPSY;  Surgeon: Danie Binder, MD;  Location: [facility] END[facility];  Service: Endoscopy;;  gastric bx's  . ESOPHAGOGASTRODUODENOSCOPY N/A 08/07/2018   Procedure: ESOPHAGOGASTRODUODENOSCOPY (EGD);  Surgeon: Danie Binder, MD;  Location: [facility] ENDO SUITE;  Service: Endoscopy;  Laterality: N/A;  3:00pm  . HYDRADENITIS EXCISION Right 05/29/2018   Procedure: EXCISION HIDRADENITIS AXILLA;  Surgeon: Virl Cagey, MD;  Location: [facility] ORS;  Service: General;  Laterality: Right;  . MALONEY  DILATION N/A 08/07/2018   Procedure: Elease Hashimoto DILATION;  Surgeon: West Bali, MD;  Location: [facility] ENDO SU[facility];  Service: Endoscopy;  Laterality: N/A;  . none    . SAVORY DILATION N/A 08/07/2018   Procedure: SAVORY DILATION;  Surgeon: West Bali, MD;  Location: [facility] END[facility] SU[facility];  Service: Endoscopy;  Laterality: N/A;  . WISDOM TOOTH EXTRACTION       OB History    Gravida      Para      Term      Preterm      AB      Living  0     SAB      TAB      Ectopic      Multiple      Live Births              Family History  Problem Relation Age of Onset  . Arrhythmia Mother 61       History of Cardiac Stents  . Hypertension Mother   . Diabetes Mother   . COPD Mother   . Colon polyps Mother   . Hypertension Father   . Diabetes  Other        Family History  . Hypertension Other        Family History  . Diabetes Sister   . Hypertension Sister   . Colon cancer Maternal Uncle   . Crohn's disease Cousin     Social History   Tobacco Use  . Smoking status: Former Smoker    Packs/day: 1.00    Years: 12.00    Pack years: 12.00    Types: Cigarettes    Start date: 01/21/2004    Quit date: 09/10/2011    Years since quitting: 8.2  . Smokeless tobacco: Never Used  Substance Use Topics  . Alcohol use: Never    Alcohol/week: 0.0 standard drinks  . Drug use: No  Employed Lives with spouse  Home Medications Prior to Admission medications   Medication Sig Start Date End Date Taking? Authorizing Provider  acetaminophen (TYLENOL) 500 MG tablet Take 500 mg by mouth every 6 (six) hours as needed for mild pain or moderate pain.    [provider]  benzonatate (TESSALON) 100 MG capsule Take 1 capsule (100 mg total) by mouth every 8 (eight) hours. 11/27/18   Eber Hong, MD  FIBER ADULT GUMMIES PO Take 2 tablets by mouth at bedtime.     [provider]  metoprolol tartrate (LOPRESSOR) 25 MG tablet Take 1 as needed feeling your heart is racing, may repeat once in 30 minutes if needed 12/02/19   Devoria Albe, MD  Probiotic Product (PROBIOTIC DAILY PO) Take 1 tablet by mouth at bedtime.     [provider]  promethazine-dextromethorphan (PROMETHAZINE-DM) 6.25-15 MG/5ML syrup Take 5 mLs by mouth 4 (four) times daily as needed for cough. 11/27/18   Eber Hong, MD  Vitamin D, Ergocalciferol, (DRISDOL) 50000 UNITS CAPS capsule Take 50,000 Units by mouth every Saturday.  04/22/14   [provider]  Patient states she takes Metformin, Claritin, and vitamin D  Allergies    Dificid [fidaxomicin], Amoxicillin, Clindamycin/lincomycin, and Heparin  Review of Systems   Review of Systems  All other systems reviewed and are negative.   Physical Exam Updated Vital Signs BP (!) 114/53   Pulse 77    Temp 98.9 F (37.2 C)   Resp 15   Ht 5\' 9"  (1.753 m)   Wt 127.5 kg  SpO2 99%   BMI 41.50 kg/m   Physical Exam Vitals and nursing note reviewed.  Constitutional:      Appearance: Normal appearance. She is obese.  HENT:     Head: Normocephalic and atraumatic.     Left Ear: External ear normal.     Nose: Nose normal.     Mouth/Throat:     Mouth: Mucous membranes are dry.  Eyes:     Extraocular Movements: Extraocular movements intact.     Conjunctiva/sclera: Conjunctivae normal.     Pupils: Pupils are equal, round, and reactive to light.  Cardiovascular:     Rate and Rhythm: Normal rate and regular rhythm.     Pulses: Normal pulses.     Heart sounds: Normal heart sounds. No murmur.  Pulmonary:     Effort: Pulmonary effort is normal. No respiratory distress.     Breath sounds: Normal breath sounds.  Chest:     Chest wall: No tenderness.       Comments: Area of pain noted Musculoskeletal:        General: No swelling. Normal range of motion.     Cervical back: Normal range of motion.     Right lower leg: No edema.     Left lower leg: No edema.  Skin:    General: Skin is warm and dry.     Findings: No erythema or rash.  Neurological:     General: No focal deficit present.     Mental Status: She is alert and oriented to person, place, and time.     Cranial Nerves: No cranial nerve deficit.  Psychiatric:        Mood and Affect: Mood normal.        Behavior: Behavior normal.        Thought Content: Thought content normal.     ED Results / Procedures / Treatments   Labs (all labs ordered are listed, but only abnormal results are displayed) Results for orders placed or performed during the hospital encounter of 12/01/19  Basic metabolic panel  Result Value Ref Range   Sodium 138 135 - 145 mmol/L   Potassium 3.5 3.5 - 5.1 mmol/L   Chloride 104 98 - 111 mmol/L   CO2 26 22 - 32 mmol/L   Glucose, Bld 99 70 - 99 mg/dL   BUN 12 6 - 20 mg/dL   Creatinine, Ser 9.52 0.44  - 1.00 mg/dL   Calcium 8.8 (L) 8.9 - 10.3 mg/dL   GFR calc non Af Amer >60 >60 mL/min   GFR calc Af Amer >60 >60 mL/min   Anion gap 8 5 - 15  CBC  Result Value Ref Range   WBC 9.7 4.0 - 10.5 K/uL   RBC 5.35 (H) 3.87 - 5.11 MIL/uL   Hemoglobin 13.3 12.0 - 15.0 g/dL   HCT 84.1 32.4 - 40.1 %   MCV 74.4 (L) 80.0 - 100.0 fL   MCH 24.9 (L) 26.0 - 34.0 pg   MCHC 33.4 30.0 - 36.0 g/dL   RDW 02.7 (H) 25.3 - 66.4 %   Platelets 240 150 - 400 K/uL   nRBC 0.0 0.0 - 0.2 %  TSH  Result Value Ref Range   TSH 3.775 0.350 - 4.500 uIU/mL  Troponin I (High Sensitivity)  Result Value Ref Range   Troponin I (High Sensitivity) <2 <18 ng/L  Troponin I (High Sensitivity)  Result Value Ref Range   Troponin I (High Sensitivity) <2 <18 ng/L   Laboratory interpretation  all normal except low indices consistent with low iron although she is not anemic    EKG EKG Interpretation  Date/Time:  Wednesday Dec 01 2019 22:38:07 EDT Ventricular Rate:  77 PR Interval:  172 QRS Duration: 84 QT Interval:  374 QTC Calculation: 423 R Axis:   99 Text Interpretation: Normal sinus rhythm Rightward axis Nonspecific T wave abnormality Abnormal ECG No significant change since last tracing Confirmed by Linwood Dibbles [phone]) on 12/01/2019 10:45:17 PM   Radiology DG Chest Port 1 View  Result Date: 12/01/2019 CLINICAL DATA:  Intermittent chest tightness. EXAM: PORTABLE CHEST 1 VIEW COMPARISON:  04/30/2019 FINDINGS: The heart size and mediastinal contours are within normal limits. Both lungs are clear. The visualized skeletal structures are unremarkable. IMPRESSION: No active disease. Electronically Signed   By: Katherine Mantle M.D.   On: 12/01/2019 23:16    Procedures Procedures (including critical care time)  Medications Ordered in ED Medications  sodium chloride flush (NS) 0.9 % injection 3 mL (has no administration in time range)    ED Course  I have reviewed the triage vital signs and the nursing  notes.  Pertinent labs & imaging results that were available during my care of the patient were reviewed by me and considered in my medical decision making (see chart for details).    MDM Rules/Calculators/A&P                      Patient states she has seen Dr. Diona Browner, cardiology, before.  We discussed her test results that were back and they were all basically normal except for some low indices on her CBC consistent with a menstruating female.  She is not anemic.  We discussed the role of the delta troponin.  I added a TSH to her evaluation because of her complaints of fatigue.  Recheck at time of discharge patient's delta troponin is negative and her TSH is normal.  She has had no more episodes of feeling her heart fluttering or racing while in the ED.  We discussed following up with cardiology.  When I look at her chart she was last seen in February 2016 by Dr. Purvis Sheffield when she was having episodes of atrial fibrillation.  She states she used to have some medication she can take when her heart was racing and I will prescribe her some metoprolol to use as needed until she can see the cardiologist.    Final Clinical Impression(s) / ED Diagnoses Final diagnoses:  Palpitations  Atypical chest pain  Fatigue, unspecified type    Rx / DC Orders ED Discharge Orders         Ordered    metoprolol tartrate (LOPRESSOR) 25 MG tablet     12/02/19 0303         Plan discharge  Devoria Albe, MD, Concha Pyo, MD 12/02/19 [phone]

## 2019-12-02 LAB — TSH: TSH: 3.775 u[IU]/mL (ref 0.350–4.500)

## 2019-12-02 LAB — TROPONIN I (HIGH SENSITIVITY): Troponin I (High Sensitivity): <2 ng/L (ref <18)

## 2019-12-02 MED ORDER — METOPROLOL TARTRATE 25 MG PO TABS
ORAL_TABLET | ORAL | 0 refills | Status: DC
Start: 2019-12-02 — End: 2021-04-20

## 2019-12-02 NOTE — ED Notes (Signed)
Pt unable to sign for discharge papers due to computer malfunction. Instructions reviewed with pt and all questions answered. Pt ambulatory to waiting room.

## 2019-12-02 NOTE — Discharge Instructions (Addendum)
When I look in the chart you were last seen in February 2016 by Dr. Purvis Sheffield, Cardiologist.  Please call his office to get an appointment to have him reevaluate you.  Use the metoprolol if you feel like your heart is racing or beating irregularly.  You may repeat it again in 30 minutes if it has not helped.  Please return to the emergency department if you feel like your heart is racing or beating irregularly and it lasts more than 60 minutes.

## 2019-12-25 ENCOUNTER — Other Ambulatory Visit: Payer: Self-pay

## 2019-12-25 ENCOUNTER — Ambulatory Visit
Admission: EM | Admit: 2019-12-25 | Discharge: 2019-12-25 | Disposition: A | Discharge status: Home / Self Care | Payer: BC Managed Care – PPO | Attending: Emergency Medicine | Admitting: Emergency Medicine

## 2019-12-25 DIAGNOSIS — J029 Acute pharyngitis, unspecified: Secondary | ICD-10-CM | POA: Diagnosis present

## 2019-12-25 LAB — POCT RAPID STREP A (OFFICE): Rapid Strep A Screen: NEGATIVE

## 2019-12-25 MED ORDER — LIDOCAINE VISCOUS HCL 2 % MT SOLN
15.0000 mL | OROMUCOSAL | 0 refills | Status: DC | PRN
Start: 2019-12-25 — End: 2020-07-07

## 2019-12-25 MED ORDER — CETIRIZINE HCL 10 MG PO TABS
10.0000 mg | ORAL_TABLET | Freq: Every day | ORAL | 0 refills | Status: DC
Start: 2019-12-25 — End: 2020-07-07

## 2019-12-25 NOTE — Discharge Instructions (Signed)
Strep test negative, will send out for culture and we will call you with results Get plenty of rest and push fluids Zyrtec prescribed. Use daily for symptomatic relief Viscous lidocaine prescribed.  This is an oral solution you can swish, and gargle as needed for symptomatic relief of sore throat.  Do not exceed 8 doses in a 24 hour period.  Do not use prior to eating, as this will numb your entire mouth.   Drink warm or cool liquids, use throat lozenges, or popsicles to help alleviate symptoms Take OTC ibuprofen or tylenol as needed for pain Follow up with PCP if symptoms persists Return or go to ER if patient has any new or worsening symptoms such as fever, chills, nausea, vomiting, worsening sore throat, cough, abdominal pain, chest pain, changes in bowel or bladder habits, etc... 

## 2019-12-25 NOTE — ED Provider Notes (Signed)
[place]   [id number] 12/25/19 Arrival Time: 0803  HL:KTGY THROAT  SUBJECTIVE: History from: patient.  Deborah Riddle is a 34 y.o. female who presents with runny nose, and sore throat x few days.  Denies sick exposure to strep, flu or mono, or precipitating event.  Has NOT tried OTC medications.  Symptoms are made worse with swallowing, but tolerating liquids and own secretions without difficulty.  Reports previous symptoms in the past.  Denies fever, chills, fatigue, ear pain, sinus pain, SOB, wheezing, chest pain, nausea, rash, changes in bowel or bladder habits.       ROS: As per HPI.  All other pertinent ROS negative.     Past Medical History:  Diagnosis Date  . Body mass index 40.0-44.9, adult ([place])   . Chest pain, unspecified   . Dysrhythmia    AFib  . GERD (gastroesophageal reflux disease)   . Morbid obesity ([place])   . Paroxysmal atrial fibrillation (HCC)   . Polycystic disease, ovaries   . Sickle cell trait ([place])   . Tobacco use disorder    Past Surgical History:  Procedure Laterality Date  . BIOPSY  08/07/2018   Procedure: BIOPSY;  Surgeon: Danie Binder, MD;  [facility];  Service: Endoscopy;;  gastric bx's  . ESOPHAGOGASTRODUODENOSCOPY N/A 08/07/2018   Procedure: ESOPHAGOGASTRODUODENOSCOPY (EGD);  Surgeon: Danie Binder, MD;  Location[facility];  Service: Endoscopy;  Laterality: N/A;  3:00pm  . HYDRADENITIS EXCISION Right 05/29/2018   Procedure: EXCISION HIDRADENITIS AXILLA;  Surgeon: Virl Cagey, MD;  Location: [facility] ORS;  Service: General;  Laterality: Right;  . MALONEY DILATION N/A 08/07/2018   Procedure: Venia Minks DILATION;  Surgeon: Danie Binder, MD;  [facility];  Service: Endoscopy;  Laterality: N/A;  . none    . SAVORY DILATION N/A 08/07/2018   Procedure: SAVORY DILATION;  Surgeon: Danie Binder, MD;  [facility];  Service: Endoscopy;  Laterality: N/A;  . WISDOM TOOTH EXTRACTION      Allergies  Allergen Reactions  . Dificid [Fidaxomicin] Shortness Of Breath  . Amoxicillin Swelling  . Clindamycin/Lincomycin     "caused c diff"  . Heparin Other (See Comments)    Lowers BP Lowers blood pressure    No current facility-administered medications on file prior to encounter.   Current Outpatient Medications on File Prior to Encounter  Medication Sig Dispense Refill  . acetaminophen (TYLENOL) 500 MG tablet Take 500 mg by mouth every 6 (six) hours as needed for mild pain or moderate pain.    Marland Kitchen FIBER ADULT GUMMIES PO Take 2 tablets by mouth at bedtime.     . metoprolol tartrate (LOPRESSOR) 25 MG tablet Take 1 as needed feeling your heart is racing, may repeat once in 30 minutes if needed 10 tablet 0  . Probiotic Product (PROBIOTIC DAILY PO) Take 1 tablet by mouth at bedtime.     . promethazine-dextromethorphan (PROMETHAZINE-DM) 6.25-15 MG/5ML syrup Take 5 mLs by mouth 4 (four) times daily as needed for cough. 118 mL 0  . Vitamin D, Ergocalciferol, (DRISDOL) 50000 UNITS CAPS capsule Take 50,000 Units by mouth every Saturday.      Social History   Socioeconomic History  . Marital status: Married    Spouse name: CHRISTOPHER   . Number of children: Not on file  . Years of education: Not on file  . Highest education level: Not on file  Occupational History  . Occupation: Engineer, building services:  [employer]  Tobacco Use  . Smoking status: Former Smoker    Packs/day: 1.00    Years: 12.00    Pack years: 12.00    Types: Cigarettes    Start date: 01/21/2004    Quit date: 09/10/2011    Years since quitting: 8.2  . Smokeless tobacco: Never Used  Vaping Use  . Vaping Use: Never used  Substance and Sexual Activity  . Alcohol use: Never    Alcohol/week: 0.0 standard drinks  . Drug use: No  . Sexual activity: Yes  Other Topics Concern  . Not on file  Social History Narrative   Lives in [place], [place], [place] with husband.  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 HAS ONE ADOPTED SON: 9 YO.   Social Determinants of Health   Financial Resource Strain:   . Difficulty of Paying Living Expenses:   Food Insecurity:   . Worried About Programme researcher, broadcasting/film/video in the Last Year:   . Barista in the Last Year:   Transportation Needs:   . Freight forwarder (Medical):   Marland Kitchen Lack of Transportation (Non-Medical):   Physical Activity:   . Days of Exercise per Week:   . Minutes of Exercise per Session:   Stress:   . Feeling of Stress :   Social Connections:   . Frequency of Communication with Friends and Family:   . Frequency of Social Gatherings with Friends and Family:   . Attends Religious Services:   . Active Member of Clubs or Organizations:   . Attends Banker Meetings:   Marland Kitchen Marital Status:   Intimate Partner Violence:   . Fear of Current or Ex-Partner:   . Emotionally Abused:   Marland Kitchen Physically Abused:   . Sexually Abused:    Family History  Problem Relation Age of Onset  . Arrhythmia Mother 76       History of Cardiac Stents  . Hypertension Mother   . Diabetes Mother   . COPD Mother   . Colon polyps Mother   . Hypertension Father   . Diabetes Other        Family History  . Hypertension Other        Family History  . Diabetes Sister   . Hypertension Sister   . Colon cancer Maternal Uncle   . Crohn's disease Cousin     OBJECTIVE:  Vitals:   12/25/19 0812  BP: 130/88  Pulse: 76  Resp: 20  Temp: 98.9 F (37.2 C)  SpO2: 96%     General appearance: alert; well-appearing, nontoxic; speaking in full sentences and tolerating own secretions HEENT: NCAT; Ears: EACs clear, TMs pearly gray; Eyes: PERRL.  EOM grossly intact. Nose: nares patent without rhinorrhea, Throat: oropharynx clear, tonsils non erythematous or enlarged, uvula midline  Neck: supple without LAD Lungs: unlabored respirations, symmetrical air entry; cough: absent; no respiratory distress; CTAB Heart: regular rate and rhythm.  Skin: warm  and dry Psychological: alert and cooperative; normal mood and affect   LABS: Results for orders placed or performed during the hospital encounter of 12/25/19 (from the past 24 hour(s))  POCT rapid strep A     Status: None   Collection Time: 12/25/19  8:21 AM  Result Value Ref Range   Rapid Strep A Screen Negative Negative     ASSESSMENT & PLAN:  1. Sore throat     Meds ordered this encounter  Medications  . cetirizine (ZYRTEC) 10 MG tablet  Sig: Take 1 tablet (10 mg total) by mouth daily.    Dispense:  30 tablet    Refill:  0    Order Specific Question:   Supervising Provider    Answer:   Eustace Moore [[id number]]  . lidocaine (XYLOCAINE) 2 % solution    Sig: Use as directed 15 mLs in the mouth or throat as needed for mouth pain (Do NOT exceed 8 doses in a 24 hour period).    Dispense:  100 mL    Refill:  0    Order Specific Question:   Supervising Provider    Answer:   Eustace Moore [[id number]]   Strep test negative, will send out for culture and we will call you with results Get plenty of rest and push fluids Zyrtec prescribed. Use daily for symptomatic relief Viscous lidocaine prescribed.  This is an oral solution you can swish, and gargle as needed for symptomatic relief of sore throat.  Do not exceed 8 doses in a 24 hour period.  Do not use prior to eating, as this will numb your entire mouth.   Drink warm or cool liquids, use throat lozenges, or popsicles to help alleviate symptoms Take OTC ibuprofen or tylenol as needed for pain Follow up with PCP if symptoms persists Return or go to ER if patient has any new or worsening symptoms such as fever, chills, nausea, vomiting, worsening sore throat, cough, abdominal pain, chest pain, changes in bowel or bladder habits, etc...  Reviewed expectations re: course of current medical issues. Questions answered. Outlined signs and symptoms indicating need for more acute intervention. Patient verbalized  understanding. After Visit Summary given.        Alvino Chapel [place], PA-C 12/25/19 [phone]

## 2019-12-25 NOTE — ED Triage Notes (Signed)
Pt presents with sore throat that began a couple days ago  And states tonsils feel swollen

## 2019-12-27 LAB — CULTURE, GROUP A STREP (THRC)

## 2020-02-06 IMAGING — DX DG ABDOMEN 1V
2 series · 2 of 2 positions shown · non-contrast
Comparison: 05/19/2014 abdominal radiograph

CLINICAL DATA: Abdominal pain, nausea

EXAM:
ABDOMEN - 1 VIEW

[abdomen kub (1 of 2)]
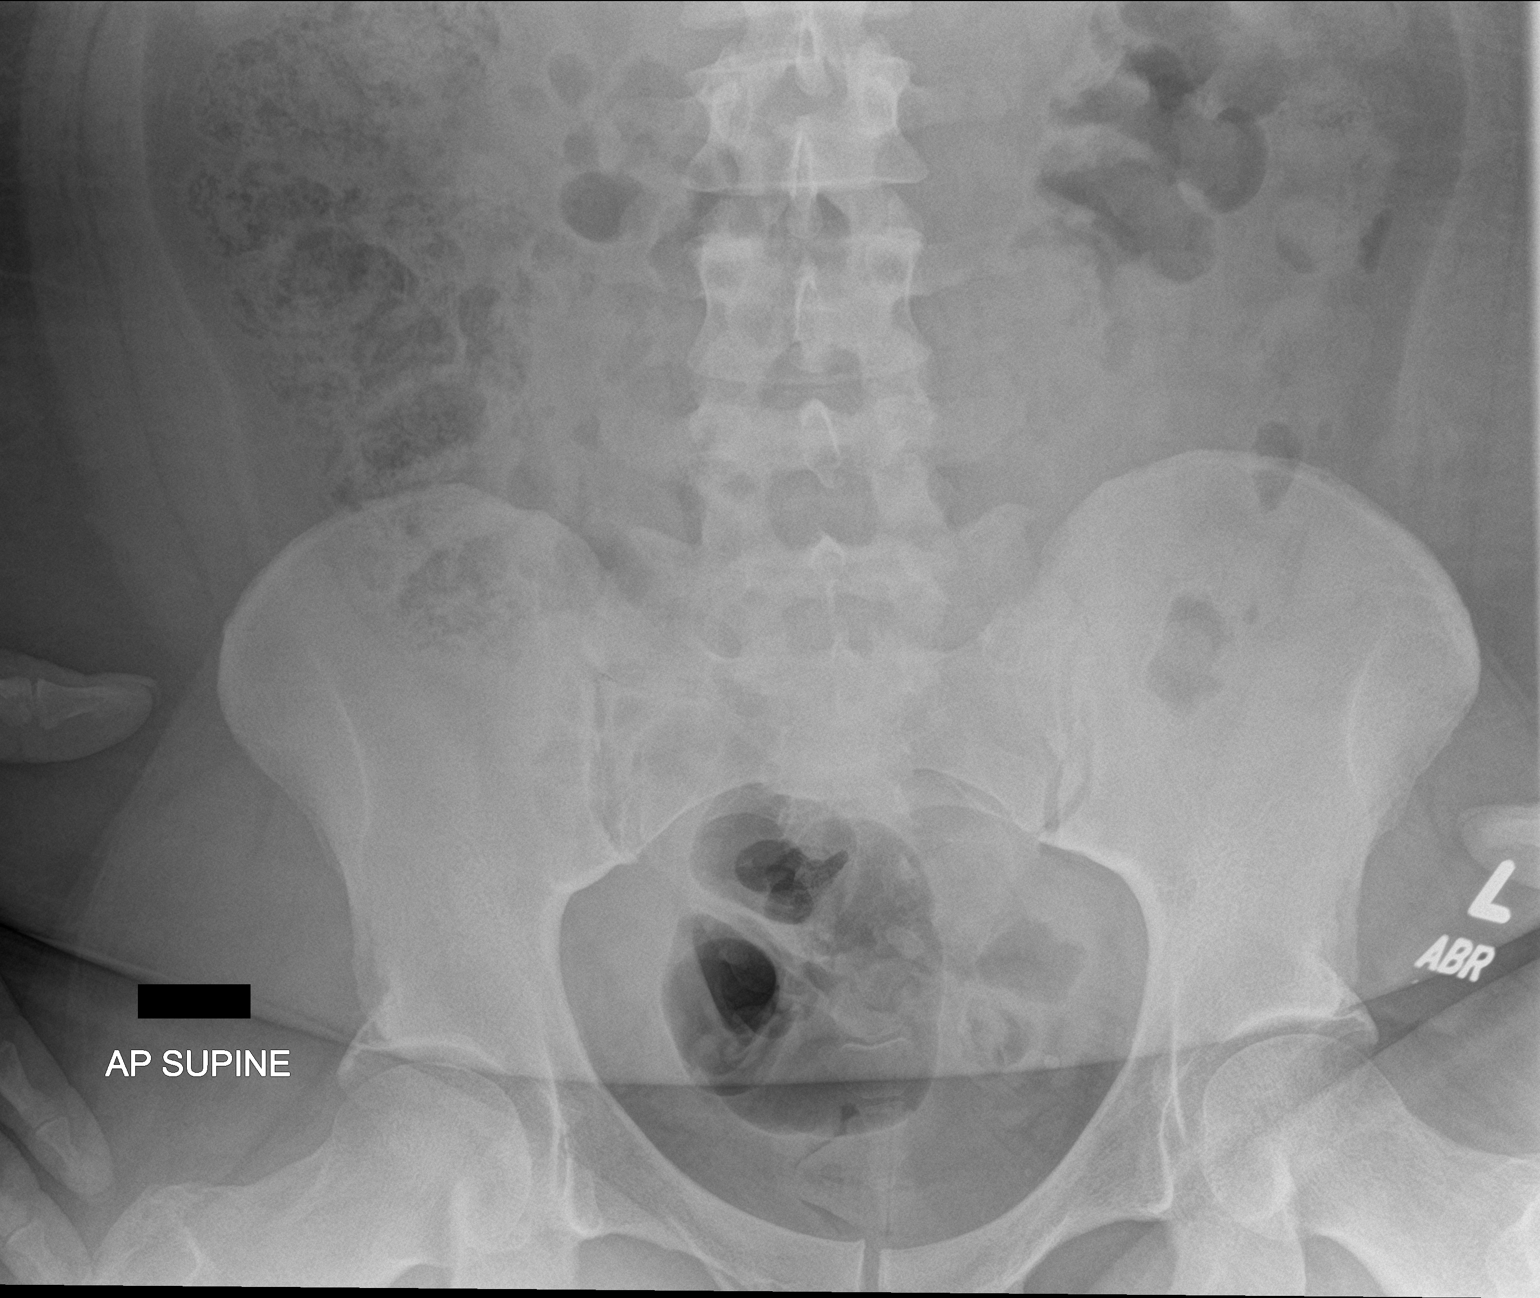

[abdomen kub (2 of 2)]
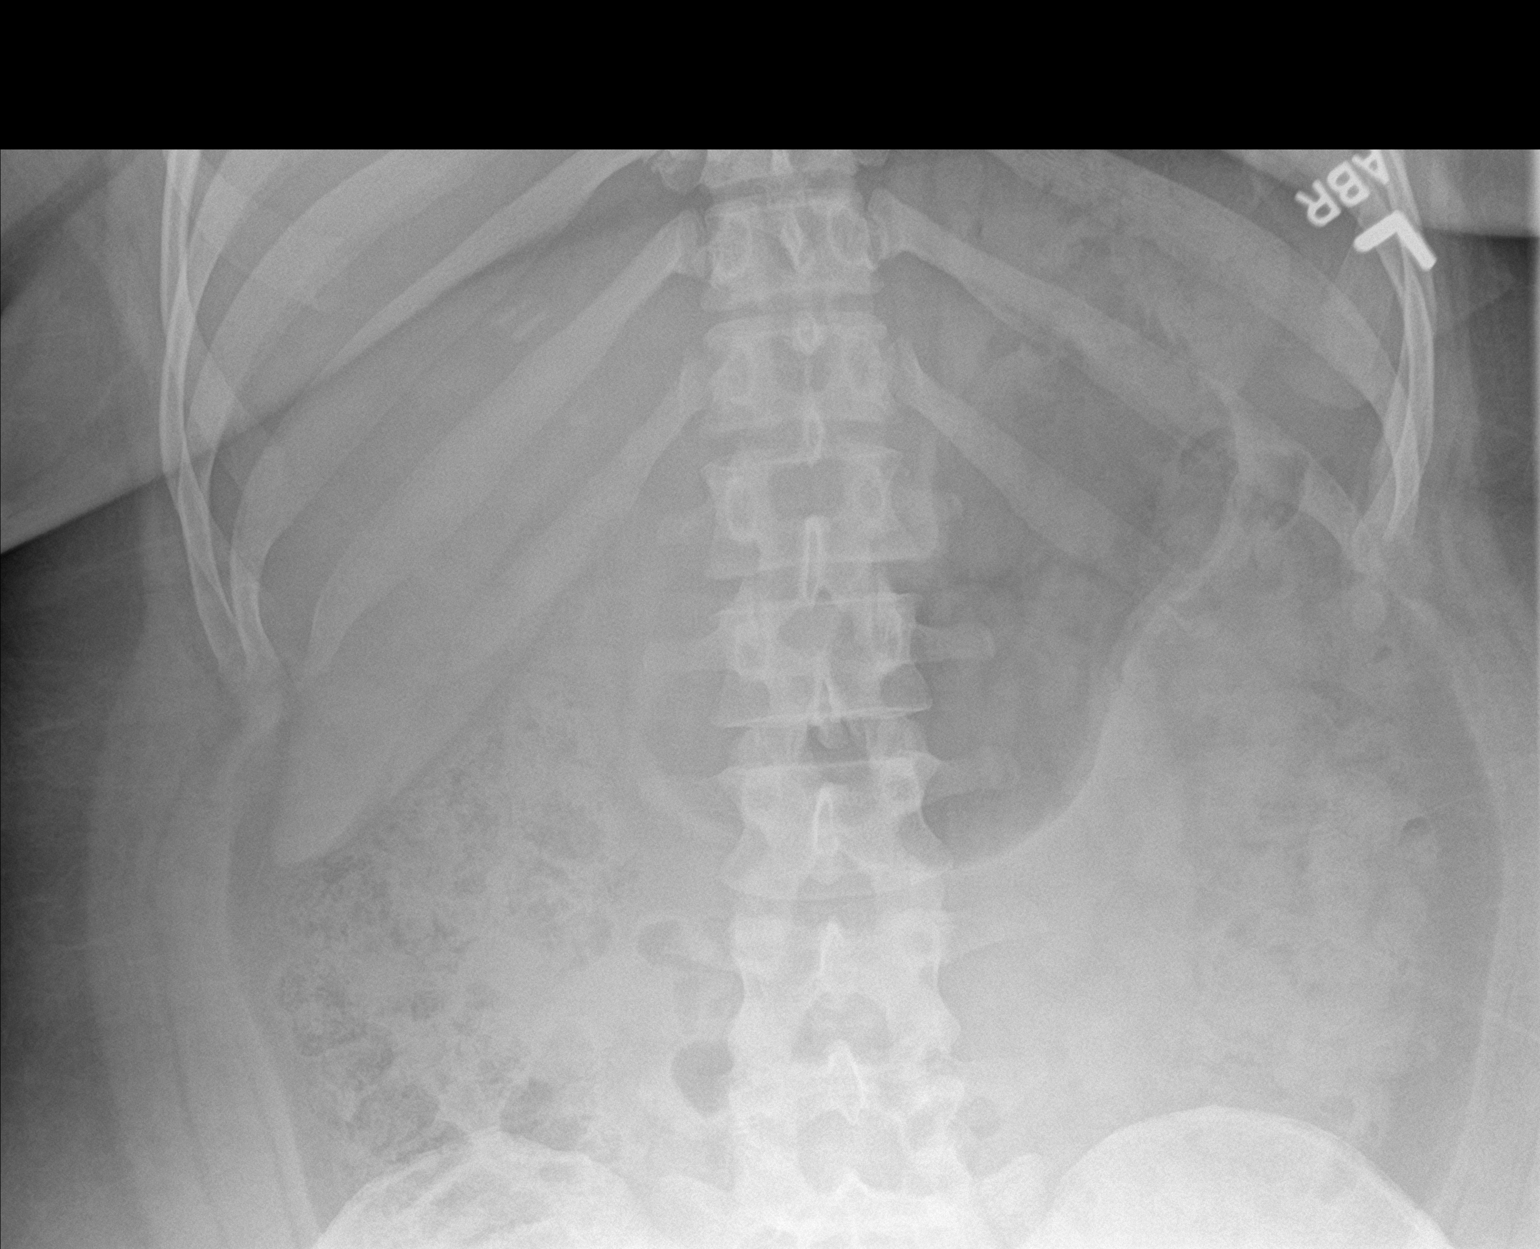

[2 of 2 positions shown; findings below may reference images not displayed]

FINDINGS: No dilated small bowel loops. No evidence of pneumatosis or
pneumoperitoneum. Moderate colorectal stool volume, most prominent
in the proximal colon. No radiopaque nephrolithiasis.
IMPRESSION: 1. Nonobstructive bowel gas pattern.
2. Moderate colorectal stool volume, most prominent in the proximal
colon, suggesting constipation.

## 2020-07-07 ENCOUNTER — Emergency Department (HOSPITAL_COMMUNITY): Payer: BC Managed Care – PPO | Admitting: Certified Registered Nurse Anesthetist

## 2020-07-07 ENCOUNTER — Emergency Department (HOSPITAL_COMMUNITY): Payer: BC Managed Care – PPO

## 2020-07-07 ENCOUNTER — Encounter (HOSPITAL_COMMUNITY): Admission: EM | Disposition: A | Discharge status: Home / Self Care | Payer: Self-pay | Source: Home / Self Care

## 2020-07-07 ENCOUNTER — Encounter (HOSPITAL_COMMUNITY): Payer: Self-pay | Admitting: Emergency Medicine

## 2020-07-07 ENCOUNTER — Ambulatory Visit (HOSPITAL_COMMUNITY)
Admission: EM | Admit: 2020-07-07 | Discharge: 2020-07-07 | Disposition: A | Discharge status: Home / Self Care | Payer: BC Managed Care – PPO | Attending: Emergency Medicine | Admitting: Emergency Medicine

## 2020-07-07 ENCOUNTER — Ambulatory Visit: Admit: 2020-07-07 | Payer: BC Managed Care – PPO | Admitting: Obstetrics and Gynecology

## 2020-07-07 ENCOUNTER — Other Ambulatory Visit: Payer: Self-pay

## 2020-07-07 ENCOUNTER — Encounter: Payer: Self-pay | Admitting: Obstetrics and Gynecology

## 2020-07-07 ENCOUNTER — Other Ambulatory Visit: Payer: Self-pay | Admitting: Obstetrics and Gynecology

## 2020-07-07 DIAGNOSIS — R102 Pelvic and perineal pain: Secondary | ICD-10-CM

## 2020-07-07 DIAGNOSIS — Z419 Encounter for procedure for purposes other than remedying health state, unspecified: Secondary | ICD-10-CM

## 2020-07-07 DIAGNOSIS — O034 Incomplete spontaneous abortion without complication: Secondary | ICD-10-CM | POA: Diagnosis present

## 2020-07-07 DIAGNOSIS — Z7984 Long term (current) use of oral hypoglycemic drugs: Secondary | ICD-10-CM | POA: Insufficient documentation

## 2020-07-07 DIAGNOSIS — Z87891 Personal history of nicotine dependence: Secondary | ICD-10-CM | POA: Diagnosis not present

## 2020-07-07 DIAGNOSIS — Z20822 Contact with and (suspected) exposure to covid-19: Secondary | ICD-10-CM | POA: Diagnosis not present

## 2020-07-07 DIAGNOSIS — Z881 Allergy status to other antibiotic agents status: Secondary | ICD-10-CM | POA: Insufficient documentation

## 2020-07-07 DIAGNOSIS — Z880 Allergy status to penicillin: Secondary | ICD-10-CM | POA: Diagnosis not present

## 2020-07-07 DIAGNOSIS — Z888 Allergy status to other drugs, medicaments and biological substances status: Secondary | ICD-10-CM | POA: Diagnosis not present

## 2020-07-07 HISTORY — PX: DILATION AND EVACUATION: SHX1459

## 2020-07-07 LAB — CBC WITH DIFFERENTIAL/PLATELET
Abs Immature Granulocytes: 0.08 10*3/uL — ABNORMAL HIGH (ref 0.00–0.07)
Basophils Absolute: 0.1 10*3/uL (ref 0.0–0.1)
Basophils Relative: 1 %
Eosinophils Absolute: 0.2 10*3/uL (ref 0.0–0.5)
Eosinophils Relative: 3 %
HCT: 32.6 % — ABNORMAL LOW (ref 36.0–46.0)
Hemoglobin: 10.5 g/dL — ABNORMAL LOW (ref 12.0–15.0)
Immature Granulocytes: 1 %
Lymphocytes Relative: 27 %
Lymphs Abs: 2.2 10*3/uL (ref 0.7–4.0)
MCH: 23.1 pg — ABNORMAL LOW (ref 26.0–34.0)
MCHC: 32.2 g/dL (ref 30.0–36.0)
MCV: 71.6 fL — ABNORMAL LOW (ref 80.0–100.0)
Monocytes Absolute: 0.5 10*3/uL (ref 0.1–1.0)
Monocytes Relative: 7 %
Neutro Abs: 5 10*3/uL (ref 1.7–7.7)
Neutrophils Relative %: 61 %
Platelets: 309 10*3/uL (ref 150–400)
RBC: 4.55 MIL/uL (ref 3.87–5.11)
RDW: 18.5 % — ABNORMAL HIGH (ref 11.5–15.5)
WBC: 8 10*3/uL (ref 4.0–10.5)
nRBC: 0 % (ref 0.0–0.2)

## 2020-07-07 LAB — RESP PANEL BY RT-PCR (FLU A&B, COVID) ARPGX2
Influenza A by PCR: NEGATIVE
Influenza B by PCR: NEGATIVE
SARS Coronavirus 2 by RT PCR: NEGATIVE

## 2020-07-07 LAB — URINALYSIS, ROUTINE W REFLEX MICROSCOPIC
Bilirubin Urine: NEGATIVE
Glucose, UA: NEGATIVE mg/dL
Ketones, ur: NEGATIVE mg/dL
Nitrite: NEGATIVE
Protein, ur: 100 mg/dL — AB
RBC / HPF: >50 RBC/hpf — ABNORMAL HIGH (ref 0–5)
Specific Gravity, Urine: 1.028 (ref 1.005–1.030)
WBC, UA: >50 WBC/hpf — ABNORMAL HIGH (ref 0–5)
pH: 5 (ref 5.0–8.0)

## 2020-07-07 LAB — BASIC METABOLIC PANEL
Anion gap: 8 (ref 5–15)
BUN: 11 mg/dL (ref 6–20)
CO2: 22 mmol/L (ref 22–32)
Calcium: 8.4 mg/dL — ABNORMAL LOW (ref 8.9–10.3)
Chloride: 107 mmol/L (ref 98–111)
Creatinine, Ser: 0.89 mg/dL (ref 0.44–1.00)
GFR, Estimated: >60 mL/min (ref >60)
Glucose, Bld: 97 mg/dL (ref 70–99)
Potassium: 3.8 mmol/L (ref 3.5–5.1)
Sodium: 137 mmol/L (ref 135–145)

## 2020-07-07 LAB — CBG MONITORING, ED
Glucose-Capillary: 82 mg/dL (ref 70–99)
Glucose-Capillary: 85 mg/dL (ref 70–99)

## 2020-07-07 LAB — HCG, QUANTITATIVE, PREGNANCY: hCG, Beta Chain, Quant, S: 199 m[IU]/mL — ABNORMAL HIGH (ref <5)

## 2020-07-07 LAB — WET PREP, GENITAL
Clue Cells Wet Prep HPF POC: NONE SEEN
Sperm: NONE SEEN
Trich, Wet Prep: NONE SEEN
Yeast Wet Prep HPF POC: NONE SEEN

## 2020-07-07 SURGERY — DILATION AND EVACUATION, UTERUS

## 2020-07-07 MED ORDER — PROPOFOL 10 MG/ML IV BOLUS
INTRAVENOUS | Status: AC
  Filled 2020-07-07: qty 20

## 2020-07-07 MED ORDER — CHLOROPROCAINE HCL 1 % IJ SOLN
INTRAMUSCULAR | Status: AC
  Filled 2020-07-07: qty 30

## 2020-07-07 MED ORDER — PROPOFOL 10 MG/ML IV BOLUS
INTRAVENOUS | Status: DC | PRN
  Administered 2020-07-07: 130 mg via INTRAVENOUS

## 2020-07-07 MED ORDER — ROCURONIUM BROMIDE 10 MG/ML (PF) SYRINGE
PREFILLED_SYRINGE | INTRAVENOUS | Status: DC | PRN
  Administered 2020-07-07: 50 mg via INTRAVENOUS

## 2020-07-07 MED ORDER — OXYTOCIN 10 UNIT/ML IJ SOLN
INTRAMUSCULAR | Status: DC | PRN
  Administered 2020-07-07: 10 [IU] via INTRAMUSCULAR

## 2020-07-07 MED ORDER — DOXYCYCLINE MONOHYDRATE 100 MG PO TABS: 100.0000 mg | ORAL_TABLET | Freq: Two times a day (BID) | ORAL | 0 refills | Status: AC

## 2020-07-07 MED ORDER — FENTANYL CITRATE (PF) 100 MCG/2ML IJ SOLN
50.0000 ug | Freq: Once | INTRAMUSCULAR | Status: AC
  Administered 2020-07-07: 50 ug via INTRAVENOUS
  Filled 2020-07-07: qty 2

## 2020-07-07 MED ORDER — 0.9 % SODIUM CHLORIDE (POUR BTL) OPTIME
TOPICAL | Status: DC | PRN
  Administered 2020-07-07: 20:00:00 1000 mL

## 2020-07-07 MED ORDER — KETOROLAC TROMETHAMINE 30 MG/ML IJ SOLN
INTRAMUSCULAR | Status: DC | PRN
  Administered 2020-07-07: 30 mg via INTRAVENOUS

## 2020-07-07 MED ORDER — FENTANYL CITRATE (PF) 250 MCG/5ML IJ SOLN
INTRAMUSCULAR | Status: AC
  Filled 2020-07-07: qty 5

## 2020-07-07 MED ORDER — KETOROLAC TROMETHAMINE 30 MG/ML IJ SOLN
30.0000 mg | Freq: Once | INTRAMUSCULAR | Status: AC
  Administered 2020-07-07: 30 mg via INTRAVENOUS
  Filled 2020-07-07: qty 1

## 2020-07-07 MED ORDER — MIDAZOLAM HCL 2 MG/2ML IJ SOLN
INTRAMUSCULAR | Status: DC | PRN
  Administered 2020-07-07: 2 mg via INTRAVENOUS

## 2020-07-07 MED ORDER — MIDAZOLAM HCL 2 MG/2ML IJ SOLN
INTRAMUSCULAR | Status: AC
  Filled 2020-07-07: qty 2

## 2020-07-07 MED ORDER — SODIUM CHLORIDE 0.9 % IV SOLN
100.0000 mg | Freq: Two times a day (BID) | INTRAVENOUS | Status: DC
  Administered 2020-07-07: 20:00:00 100 mg via INTRAVENOUS
  Filled 2020-07-07 (×2): qty 100

## 2020-07-07 MED ORDER — FENTANYL CITRATE (PF) 100 MCG/2ML IJ SOLN
25.0000 ug | Freq: Once | INTRAMUSCULAR | Status: AC
  Administered 2020-07-07: 25 ug via INTRAVENOUS
  Filled 2020-07-07: qty 2

## 2020-07-07 MED ORDER — DEXAMETHASONE SODIUM PHOSPHATE 10 MG/ML IJ SOLN
INTRAMUSCULAR | Status: DC | PRN
  Administered 2020-07-07: 5 mg via INTRAVENOUS

## 2020-07-07 MED ORDER — LIDOCAINE 2% (20 MG/ML) 5 ML SYRINGE
INTRAMUSCULAR | Status: DC | PRN
  Administered 2020-07-07: 100 mg via INTRAVENOUS

## 2020-07-07 MED ORDER — ONDANSETRON HCL 4 MG/2ML IJ SOLN
INTRAMUSCULAR | Status: DC | PRN
  Administered 2020-07-07: 4 mg via INTRAVENOUS

## 2020-07-07 MED ORDER — LACTATED RINGERS IV SOLN: INTRAVENOUS | Status: DC | PRN

## 2020-07-07 MED ORDER — POVIDONE-IODINE 10 % EX SWAB: 2.0000 "application " | Freq: Once | CUTANEOUS | Status: DC

## 2020-07-07 MED ORDER — CHLOROPROCAINE HCL 1 % IJ SOLN
INTRAMUSCULAR | Status: DC | PRN
  Administered 2020-07-07: 10 mL

## 2020-07-07 MED ORDER — SUGAMMADEX SODIUM 200 MG/2ML IV SOLN
INTRAVENOUS | Status: DC | PRN
  Administered 2020-07-07: 200 mg via INTRAVENOUS

## 2020-07-07 MED ORDER — SUCCINYLCHOLINE CHLORIDE 200 MG/10ML IV SOSY
PREFILLED_SYRINGE | INTRAVENOUS | Status: DC | PRN
  Administered 2020-07-07: 180 mg via INTRAVENOUS

## 2020-07-07 MED ORDER — FENTANYL CITRATE (PF) 250 MCG/5ML IJ SOLN
INTRAMUSCULAR | Status: DC | PRN
  Administered 2020-07-07: 100 ug via INTRAVENOUS
  Administered 2020-07-07: 25 ug via INTRAVENOUS
  Administered 2020-07-07: 50 ug via INTRAVENOUS

## 2020-07-07 SURGICAL SUPPLY — 24 items
CATH ROBINSON RED A/P 16FR (CATHETERS) ×8 IMPLANT
CNTNR URN SCR LID CUP LEK RST (MISCELLANEOUS) ×2 IMPLANT
CONT SPEC 4OZ STRL OR WHT (MISCELLANEOUS) ×4
DECANTER SPIKE VIAL GLASS SM (MISCELLANEOUS) ×8 IMPLANT
FILTER UTR ASPR ASSEMBLY (MISCELLANEOUS) ×4 IMPLANT
GLOVE BIOGEL PI IND STRL 7.0 (GLOVE) ×8 IMPLANT
GLOVE BIOGEL PI INDICATOR 7.0 (GLOVE) ×8
GLOVE ECLIPSE 6.5 STRL STRAW (GLOVE) ×8 IMPLANT
GOWN STRL REUS W/ TWL LRG LVL3 (GOWN DISPOSABLE) ×8 IMPLANT
GOWN STRL REUS W/TWL LRG LVL3 (GOWN DISPOSABLE) ×16
HOSE CONNECTING 18IN BERKELEY (TUBING) ×4 IMPLANT
KIT BERKELEY 1ST TRI 3/8 NO TR (MISCELLANEOUS) ×4 IMPLANT
KIT BERKELEY 1ST TRIMESTER 3/8 (MISCELLANEOUS) ×4 IMPLANT
KIT TURNOVER KIT B (KITS) ×4 IMPLANT
NS IRRIG 1000ML POUR BTL (IV SOLUTION) ×4 IMPLANT
PACK VAGINAL MINOR WOMEN LF (CUSTOM PROCEDURE TRAY) ×8 IMPLANT
PAD OB MATERNITY 4.3X12.25 (PERSONAL CARE ITEMS) ×8 IMPLANT
SET BERKELEY SUCTION TUBING (SUCTIONS) ×4 IMPLANT
TOWEL GREEN STERILE FF (TOWEL DISPOSABLE) ×16 IMPLANT
UNDERPAD 30X36 HEAVY ABSORB (UNDERPADS AND DIAPERS) ×8 IMPLANT
VACURETTE 10 RIGID CVD (CANNULA) IMPLANT
VACURETTE 7MM CVD STRL WRAP (CANNULA) ×4 IMPLANT
VACURETTE 8 RIGID CVD (CANNULA) IMPLANT
VACURETTE 9 RIGID CVD (CANNULA) IMPLANT

## 2020-07-07 NOTE — Brief Op Note (Signed)
07/07/2020  7:53 PM  PATIENT:  Deborah Riddle  34 y.o. female  PRE-OPERATIVE DIAGNOSIS: incomplete abortion/retained product of conception  POST-OPERATIVE DIAGNOSIS:  same  PROCEDURE:    SURGEON:  Surgeon(s) and Role:    * Maxie Better, MD - Primary  PHYSICIAN ASSISTANT:   ASSISTANTS: none   ANESTHESIA:   general and paracervical block Findings: moderate POC EBL:  100 mL   BLOOD ADMINISTERED:none  DRAINS: none   LOCAL MEDICATIONS USED:  OTHER nesicaine  SPECIMEN:  Source of Specimen:  POC  DISPOSITION OF SPECIMEN:  PATHOLOGY  COUNTS:  YES  TOURNIQUET:  * No tourniquets in log *  DICTATION: .Other Dictation: Dictation Number [id number]  PLAN OF CARE: Discharge to home after PACU  PATIENT DISPOSITION:  PACU - hemodynamically stable.   Delay start of Pharmacological VTE agent (>24hrs) due to surgical blood loss or risk of bleeding: no

## 2020-07-07 NOTE — Anesthesia Postprocedure Evaluation (Signed)
Anesthesia Post Note  Patient: Deborah Riddle  Procedure(s) Performed: DILATATION AND EVACUATION     Patient location during evaluation: PACU Anesthesia Type: General Level of consciousness: awake and alert Pain management: pain level controlled Vital Signs Assessment: post-procedure vital signs reviewed and stable Respiratory status: spontaneous breathing, nonlabored ventilation, respiratory function stable and patient connected to nasal cannula oxygen Cardiovascular status: blood pressure returned to baseline and stable Postop Assessment: no apparent nausea or vomiting Anesthetic complications: no   No complications documented.  Last Vitals:  Vitals:   07/07/20 2015 07/07/20 2030  BP: (!) 141/71 111/68  Pulse: 80 74  Resp: 18 16  Temp:  (!) 36.1 C  SpO2: 98% 98%    Last Pain:  Vitals:   07/07/20 2030  TempSrc:   PainSc: 0-No pain                 Virgie Chery DAVID

## 2020-07-07 NOTE — Transfer of Care (Signed)
Immediate Anesthesia Transfer of Care Note  Patient: Deborah Riddle  Procedure(s) Performed: DILATATION AND EVACUATION  Patient Location: PACU  Anesthesia Type:General  Level of Consciousness: awake and alert   Airway & Oxygen Therapy: Patient Spontanous Breathing  Post-op Assessment: Report given to RN, Post -op Vital signs reviewed and stable and Patient moving all extremities  Post vital signs: Reviewed and stable  Last Vitals:  Vitals Value Taken Time  BP 121/66 07/07/20 2001  Temp 36.6 C 07/07/20 2000  Pulse 84 07/07/20 2003  Resp 21 07/07/20 2003  SpO2 100 % 07/07/20 2003  Vitals shown include unvalidated device data.  Last Pain:  Vitals:   07/07/20 2000  TempSrc:   PainSc: 0-No pain         Complications: No complications documented.

## 2020-07-07 NOTE — ED Provider Notes (Signed)
[facility] EMERGENCY DEPARTMENT Provider Note   CSN: [id number] Arrival date & time: 07/07/20  0935     History Chief Complaint  Patient presents with  . Abdominal Pain    Deborah Riddle is a 34 y.o. female who presents with concern for progressively worsening pelvic pain and cramping, as well as vaginal bleeding.  Patient has been undergoing fertility treatments, trying to conceive for quite some time.   Patient was [redacted] weeks pregnant (two weeks ago) and had an ultrasound at her OB/GYN to establish viability, at that time there was an IUP, however there was no heartbeat.  Patient opted for medication to help remove pregnancy from her uterus rather than D&C; she took the first dose of the medication 1 week ago today.  She states that she has had cramping and bleeding since that time.    She had a follow-up appoint with her OB/GYN 2 days ago at which point repeat ultrasound revealed retained products of conception in the uterus.  Patient again refused D&C, due to concerns about risks of the procedure. Patient was prescribed high-dose ibuprofen and oxycodone for her pain and discharged home.  Per patient, her provider said there were no signs of infection at that time. Patient follows with Dr. Cherly Hensen, OBGYN in [place].   Patient denies fevers, chills, nausea, vomiting.  She endorses significantly worsening right pelvic and central pelvic pain since 7:00 this morning.  Patient is tearful, writhing in pain at the time of my exam.  She states that she continues to bleed vaginally, however not as heavily as before.  She states that she changes her pad every 2-3 hours.   I personally reviewed the patient's medical records.  She has history of hidradenitis suppurativa, paroxysmal A. fib, obesity, PCOS, GERD.  HPI     Past Medical History:  Diagnosis Date  . Body mass index 40.0-44.9, adult (HCC)   . Chest pain, unspecified   . Dysrhythmia    AFib  . GERD (gastroesophageal reflux  disease)   . Morbid obesity (HCC)   . Paroxysmal atrial fibrillation (HCC)   . Polycystic disease, ovaries   . Sickle cell trait (HCC)   . Tobacco use disorder     Patient Active Problem List   Diagnosis Date Noted  . Incomplete spontaneous abortion 07/07/2020  . Dyspepsia   . Wound dehiscence 06/18/2018  . Hidradenitis 05/19/2018  . Abdominal pain 05/13/2018  . Morbid obesity (HCC) 12/22/2014  . Chest pain 09/23/2011  . Paroxysmal atrial fibrillation (HCC)   . Palpitations 03/23/2009    Past Surgical History:  Procedure Laterality Date  . BIOPSY  08/07/2018   Procedure: BIOPSY;  Surgeon: West Bali, MD;  Location: [facility];  Service: Endoscopy;;  gastric bx's  . ESOPHAGOGASTRODUODENOSCOPY N/A 08/07/2018   Procedure: ESOPHAGOGASTRODUODENOSCOPY (EGD);  Surgeon: West Bali, MD;  Location: [facility];  Service: Endoscopy;  Laterality: N/A;  3:00pm  . HYDRADENITIS EXCISION Right 05/29/2018   Procedure: EXCISION HIDRADENITIS AXILLA;  Surgeon: Lucretia Roers, MD;  Location: [facility] ORS;  Service: General;  Laterality: Right;  . MALONEY DILATION N/A 08/07/2018   Procedure: Elease Hashimoto DILATION;  Surgeon: West Bali, MD;  Location: [facility];  Service: Endoscopy;  Laterality: N/A;  . none    . SAVORY DILATION N/A 08/07/2018   Procedure: SAVORY DILATION;  Surgeon: West Bali, MD;  Location: [facility];  Service: Endoscopy;  Laterality: N/A;  . WISDOM TOOTH EXTRACTION  OB History    Gravida  2   Para      Term      Preterm      AB  2   Living  0     SAB  2   IAB      Ectopic      Multiple      Live Births              Family History  Problem Relation Age of Onset  . Arrhythmia Mother 850       History of Cardiac Stents  . Hypertension Mother   . Diabetes Mother   . COPD Mother   . Colon polyps Mother   . Hypertension Father   . Diabetes Other        Family History  . Hypertension Other        Family History  . Diabetes  Sister   . Hypertension Sister   . Colon cancer Maternal Uncle   . Crohn's disease Cousin     Social History   Tobacco Use  . Smoking status: Former Smoker    Packs/day: 1.00    Years: 12.00    Pack years: 12.00    Types: Cigarettes    Start date: 01/21/2004    Quit date: 09/10/2011    Years since quitting: 8.8  . Smokeless tobacco: Never Used  Vaping Use  . Vaping Use: Never used  Substance Use Topics  . Alcohol use: Never    Alcohol/week: 0.0 standard drinks  . Drug use: No    Home Medications Prior to Admission medications   Medication Sig Start Date End Date Taking? Authorizing Provider  ibuprofen (ADVIL) 800 MG tablet Take 800 mg by mouth every 8 (eight) hours as needed.   Yes [provider]  letrozole (FEMARA) 2.5 MG tablet Take 2.5 mg by mouth daily as needed. 02/08/20  Yes [provider]  loratadine (CLARITIN) 10 MG tablet Take 10 mg by mouth daily.   Yes [provider]  metFORMIN (GLUCOPHAGE) 500 MG tablet Take 500 mg by mouth daily with breakfast. 10/07/19  Yes [provider]  OVIDREL 250 MCG/0.5ML injection Inject into the skin. 03/18/20  Yes [provider]  oxyCODONE-acetaminophen (PERCOCET/ROXICET) 5-325 MG tablet Take 2 tablets by mouth 4 (four) times daily as needed. 06/27/20  Yes [provider]  promethazine-dextromethorphan (PROMETHAZINE-DM) 6.25-15 MG/5ML syrup Take 5 mLs by mouth 4 (four) times daily as needed for cough. 11/27/18  Yes Eber HongMiller, Brian, MD  Vitamin D, Ergocalciferol, (DRISDOL) 50000 UNITS CAPS capsule Take 50,000 Units by mouth every Saturday.  04/22/14  Yes [provider]  acetaminophen (TYLENOL) 500 MG tablet Take 500 mg by mouth every 6 (six) hours as needed for mild pain or moderate pain. Patient not taking: Reported on 07/07/2020    [provider]  cetirizine (ZYRTEC) 10 MG tablet Take 1 tablet (10 mg total) by mouth daily. Patient not taking: No sig reported 12/25/19    Wurst, [place], PA-C  FIBER ADULT GUMMIES PO Take 2 tablets by mouth at bedtime.  Patient not taking: Reported on 07/07/2020    [provider]  lidocaine (XYLOCAINE) 2 % solution Use as directed 15 mLs in the mouth or throat as needed for mouth pain (Do NOT exceed 8 doses in a 24 hour period). Patient not taking: No sig reported 12/25/19   Wurst, [place], PA-C  metoprolol tartrate (LOPRESSOR) 25 MG tablet Take 1 as needed feeling your heart  is racing, may repeat once in 30 minutes if needed Patient not taking: No sig reported 12/02/19   Devoria Albe, MD  Probiotic Product (PROBIOTIC DAILY PO) Take 1 tablet by mouth at bedtime.  Patient not taking: Reported on 07/07/2020    [provider]    Allergies    Dificid [fidaxomicin], Amoxicillin, Clindamycin/lincomycin, and Heparin  Review of Systems   Review of Systems  Constitutional: Positive for appetite change. Negative for activity change, chills, diaphoresis, fatigue and fever.  HENT: Negative.   Respiratory: Negative for chest tightness, shortness of breath and wheezing.   Cardiovascular: Negative for chest pain, palpitations and leg swelling.  Gastrointestinal: Negative for abdominal pain, constipation, diarrhea, nausea and vomiting.  Genitourinary: Positive for pelvic pain, vaginal bleeding and vaginal pain. Negative for dysuria, frequency and urgency.  Musculoskeletal: Negative.   Skin: Negative.   Allergic/Immunologic: Negative.   Neurological: Negative for dizziness, seizures, syncope, facial asymmetry, weakness and light-headedness.  Psychiatric/Behavioral: Negative for confusion.    Physical Exam Updated Vital Signs BP (!) 163/98   Pulse 66   Temp 98 F (36.7 C) (Oral)   Resp 19   Ht 5\' 9"  (1.753 m)   Wt 129.3 kg   LMP 05/03/2020   SpO2 98%   BMI 42.09 kg/m   Physical Exam Vitals and nursing note reviewed. Exam conducted with a chaperone present.  Constitutional:      Appearance: She is obese.  She is ill-appearing.  HENT:     Head: Normocephalic and atraumatic.     Mouth/Throat:     Mouth: Mucous membranes are moist.     Pharynx: No oropharyngeal exudate or posterior oropharyngeal erythema.  Eyes:     General:        Right eye: No discharge.        Left eye: No discharge.     Conjunctiva/sclera: Conjunctivae normal.  Cardiovascular:     Rate and Rhythm: Normal rate and regular rhythm.     Pulses:          Radial pulses are 2+ on the right side and 2+ on the left side.       Dorsalis pedis pulses are 2+ on the right side and 2+ on the left side.     Heart sounds: Normal heart sounds. No murmur heard.   Pulmonary:     Effort: Pulmonary effort is normal. No respiratory distress.     Breath sounds: Normal breath sounds. No wheezing or rales.  Chest:     Chest wall: No deformity, swelling, tenderness, crepitus or edema.  Abdominal:     General: Bowel sounds are normal. There is no distension.     Palpations: Abdomen is soft. There is no mass.     Tenderness: There is abdominal tenderness in the right lower quadrant and suprapubic area.  Genitourinary:    General: Normal vulva.     Exam position: Lithotomy position.     Vagina: Tenderness and bleeding present.     Cervix: Dilated. Cervical bleeding present.     Uterus: Normal.      Adnexa:        Right: Tenderness present. No fullness.         Left: No tenderness or fullness.          Comments: Copious blood in the vaginal vault.  Musculoskeletal:        General: No deformity.     Cervical back: Neck supple.     Right lower leg: No edema.  Left lower leg: No edema.  Skin:    General: Skin is warm and dry.     Capillary Refill: Capillary refill takes less than 2 seconds.  Neurological:     General: No focal deficit present.     Mental Status: She is alert and oriented to person, place, and time. Mental status is at baseline.  Psychiatric:        Mood and Affect: Mood normal.     ED Results / Procedures  / Treatments   Labs (all labs ordered are listed, but only abnormal results are displayed) Labs Reviewed  WET PREP, GENITAL - Abnormal; Notable for the following components:      Result Value   WBC, Wet Prep HPF POC RARE (*)    All other components within normal limits  CBC WITH DIFFERENTIAL/PLATELET - Abnormal; Notable for the following components:   Hemoglobin 10.5 (*)    HCT 32.6 (*)    MCV 71.6 (*)    MCH 23.1 (*)    RDW 18.5 (*)    Abs Immature Granulocytes 0.08 (*)    All other components within normal limits  BASIC METABOLIC PANEL - Abnormal; Notable for the following components:   Calcium 8.4 (*)    All other components within normal limits  HCG, QUANTITATIVE, PREGNANCY - Abnormal; Notable for the following components:   hCG, Beta Chain, Quant, S 199 (*)    All other components within normal limits  URINALYSIS, ROUTINE W REFLEX MICROSCOPIC - Abnormal; Notable for the following components:   APPearance CLOUDY (*)    Hgb urine dipstick LARGE (*)    Protein, ur 100 (*)    Leukocytes,Ua SMALL (*)    RBC / HPF >50 (*)    WBC, UA >50 (*)    Bacteria, UA RARE (*)    All other components within normal limits  RESP PANEL BY RT-PCR (FLU A&B, COVID) ARPGX2  RPR  CBG MONITORING, ED  CBG MONITORING, ED  GC/CHLAMYDIA PROBE AMP ([place]) NOT AT [facility]  SURGICAL PATHOLOGY    EKG None  Radiology US PELVIC COMPLETE W TRANSVAGINAL AND TORSION R/O  Result Date: 07/07/2020 CLINICAL DATA:  Pelvic pain. History of polycystic ovarian disease. Spontaneous abortion 1 week ago. EXAM: TRANSABDOMINAL AND TRANSVAGINAL ULTRASOUND OF PELVIS DOPPLER ULTRASOUND OF OVARIES TECHNIQUE: Both transabdominal and transvaginal ultrasound examinations of the pelvis were performed. Transabdominal technique was performed for global imaging of the pelvis including uterus, ovaries, adnexal regions, and pelvic cul-de-sac. It was necessary to proceed with endovaginal exam following the transabdominal exam to  visualize the uterus, ovaries, and adnexa. Color and duplex Doppler ultrasound was utilized to evaluate blood flow to the ovaries. COMPARISON:  03/30/2018 from [facility] [place] FINDINGS: Uterus Measurements: 13.0 x 6.9 by 5.6 cm = volume: 266 mL. No fibroids or other mass visualized. Endometrium Thickness: 13 mm. Focal endometrial thickening within the lower uterine segment/cervix of up to 2.2 cm, including on image 25. Suggestion of vascularity on image 26. Right ovary Measurements: 4.3 x 3.0 x 2.9 cm = volume: 19 mL. Positioned high within the right pelvis. Only visualized transabdominally. Left ovary Not visualized Pulsed Doppler evaluation of the right ovary demonstrates normal color signal and arterial/venous spectral tracings. Other findings No abnormal free fluid. Exam degraded by patient body habitus. IMPRESSION: 1. Focal endometrial thickening within the lower uterine segment and cervix. Cannot exclude retained products of conception, given suggestion of vascularity within. 2. Lack of visualization of the left ovary. Electronically Signed   By:  Jeronimo Greaves M.D.   On: 07/07/2020 11:49    Procedures Procedures (including critical care time)  Medications Ordered in ED Medications  povidone-iodine 10 % swab 2 application (has no administration in time range)  doxycycline (VIBRAMYCIN) 100 mg in sodium chloride 0.9 % 250 mL IVPB (has no administration in time range)  fentaNYL (SUBLIMAZE) injection 50 mcg (50 mcg Intravenous Given 07/07/20 1042)  ketorolac (TORADOL) 30 MG/ML injection 30 mg (30 mg Intravenous Given 07/07/20 1042)  fentaNYL (SUBLIMAZE) injection 25 mcg (25 mcg Intravenous Given 07/07/20 1229)    ED Course  I have reviewed the triage vital signs and the nursing notes.  Pertinent labs & imaging results that were available during my care of the patient were reviewed by me and considered in my medical decision making (see chart for details).    MDM Rules/Calculators/A&P                          34 year old female with recent missed spontaneous miscarriage, presenting with worsening right and central pelvic pain and vaginal bleeding today.  Differential diagnosis for this patient include missed abortion, septic abortion, ectopic pregnancy, cervicitis.    Hypertensive on intake to 142/94.  Vital signs otherwise normal.  Patient tearful at time of my exam, abdomen is soft with right lower pelvic and suprapubic tenderness to palpation.  Cardiopulmonary exam is normal.  Patient is afebrile.  We will proceed with CBC, BMP, UA, quantitative hCG, pelvic ultrasound.  Analgesia ordered.  CBC with hemoglobin 10.5, previously 11.9 per patient.  No leukocytosis  BMP unremarkable.  Quantitative hCG 199. (per patient previously >2000 on Monday)  UA with large amount of hemoglobin, small leukocytes, RBCs, WBCs, rare bacteria.  Respiratory pathogen panel negative for COVID-19, influenza A/B.  Pelvic ultrasound with endometrial thickening within lower uterine segment and cervix; cannot exclude retained products of conception given suggestion of vascularity within. Lack of visualization of the left ovary.  Normal blood flow to the right ovary.  Pelvic exam significant for products of conception coming through the cervical os, copious blood in the vaginal vault.  Case discussed with on-call OB/GYN attending, Dr. Donavan Foil, who recommended transfer to the [facility] and subsequent D&C.  Recommended we contact patient's primary OB/GYN prior to scheduling transfer.  I appreciate his collaboration in the care of this patient.  I was able to contact patient's outpatient OB/GYN, Dr. Cherly Hensen.  She agreed with plan to proceed with D&C today, which she had recommended twice earlier in the week to this patient.  She recommended transport of the patient by [facility] to [facility] pre-op area, where Dr. Cherly Hensen would receive her and perform D&C.  Appreciate her collaboration in the care of this  patient.  Virgina voiced understanding of her medical evaluation and treatment plan.  Each of her questions were answered to her expressed satisfaction.  She is amenable with the plan to transfer her to [facility] to undergo D&C this evening.  Vital signs have remained stable throughout her stay in the emergency department; patient is stable and appropriate for transfer at this time.  Final Clinical Impression(s) / ED Diagnoses Final diagnoses:  Pelvic pain    Rx / DC Orders ED Discharge Orders    None       Sherrilee Gilles 07/07/20 1910    Terrilee Files, MD 07/07/20 1921

## 2020-07-07 NOTE — ED Triage Notes (Signed)
Pt c/o lower abdominal pain for the last 2wks. Pt was 6-[redacted] wks pregnant and began to miscarry 2wks ago.  Pt has be prescribed oxycodone 5/325 without relief.

## 2020-07-07 NOTE — Anesthesia Procedure Notes (Signed)
Procedure Name: Intubation Date/Time: 07/07/2020 7:25 PM Performed by: Reece Agar, CRNA Pre-anesthesia Checklist: Patient identified, Emergency Drugs available, Suction available and Patient being monitored Patient Re-evaluated:Patient Re-evaluated prior to induction Oxygen Delivery Method: Circle System Utilized Preoxygenation: Pre-oxygenation with 100% oxygen Induction Type: IV induction, Rapid sequence and Cricoid Pressure applied Ventilation: Unable to mask ventilate Laryngoscope Size: Mac and 4 Grade View: Grade II Tube type: Oral Tube size: 7.0 mm Number of attempts: 1 Airway Equipment and Method: Stylet Placement Confirmation: ETT inserted through vocal cords under direct vision,  positive ETCO2 and breath sounds checked- equal and bilateral Secured at: 21 cm Tube secured with: Tape Dental Injury: Teeth and Oropharynx as per pre-operative assessment

## 2020-07-07 NOTE — H&P (Signed)
Deborah Riddle is an 34 y.o. female.[id number] MFb here for surgical management of retained POC noted on evaluation at [facility] where she presented today. Pt was diagnosed with blighted ovum / missed abortion two weeks ago and had used cytotec but declined  D&C for retained POC noted on office 2 days ago  Pertinent Gynecological History: Menses: regular every month without intermenstrual spotting Bleeding: SAB Contraception: none DES exposure: denies Blood transfusions: none Sexually transmitted diseases: no past history Previous GYN Procedures:  n/a   LOB History: G2P0020  Menstrual History: Menarche age: n/a Patient's last menstrual period was 05/03/2020.    Past Medical History:  Diagnosis Date  . Body mass index 40.0-44.9, adult (HCC)   . Chest pain, unspecified   . Dysrhythmia    AFib  . GERD (gastroesophageal reflux disease)   . Morbid obesity (HCC)   . Paroxysmal atrial fibrillation (HCC)   . Polycystic disease, ovaries   . Sickle cell trait (HCC)   . Tobacco use disorder     Past Surgical History:  Procedure Laterality Date  . BIOPSY  08/07/2018   Procedure: BIOPSY;  Surgeon: West Bali, MD;  Location: [facility];  Service: Endoscopy;;  gastric bx's  . ESOPHAGOGASTRODUODENOSCOPY N/A 08/07/2018   Procedure: ESOPHAGOGASTRODUODENOSCOPY (EGD);  Surgeon: West Bali, MD;  Location: [facility];  Service: Endoscopy;  Laterality: N/A;  3:00pm  . HYDRADENITIS EXCISION Right 05/29/2018   Procedure: EXCISION HIDRADENITIS AXILLA;  Surgeon: Lucretia Roers, MD;  Location: [facility];  Service: General;  Laterality: Right;  . MALONEY DILATION N/A 08/07/2018   Procedure: Elease Hashimoto DILATION;  Surgeon: West Bali, MD;  Location: [facility];  Service: Endoscopy;  Laterality: N/A;  . none    . SAVORY DILATION N/A 08/07/2018   Procedure: SAVORY DILATION;  Surgeon: West Bali, MD;  Location: [facility];  Service: Endoscopy;  Laterality: N/A;  . WISDOM TOOTH  EXTRACTION      Family History  Problem Relation Age of Onset  . Arrhythmia Mother 36       History of Cardiac Stents  . Hypertension Mother   . Diabetes Mother   . COPD Mother   . Colon polyps Mother   . Hypertension Father   . Diabetes Other        Family History  . Hypertension Other        Family History  . Diabetes Sister   . Hypertension Sister   . Colon cancer Maternal Uncle   . Crohn's disease Cousin     Social History:  reports that she quit smoking about 8 years ago. Her smoking use included cigarettes. She started smoking about 16 years ago. She has a 12.00 pack-year smoking history. She has never used smokeless tobacco. She reports that she does not drink alcohol and does not use drugs.  Allergies:  Allergies  Allergen Reactions  . Dificid [Fidaxomicin] Shortness Of Breath  . Amoxicillin Swelling  . Clindamycin/Lincomycin     "caused c diff"  . Heparin Other (See Comments)    Lowers BP Lowers blood pressure     Medications Prior to Admission  Medication Sig Dispense Refill Last Dose  . ibuprofen (ADVIL) 800 MG tablet Take 800 mg by mouth every 8 (eight) hours as needed.   07/07/2020 at Unknown time  . letrozole (FEMARA) 2.5 MG tablet Take 2.5 mg by mouth daily as needed.   september  . loratadine (CLARITIN) 10 MG tablet Take 10 mg by  mouth daily.   07/07/2020 at Unknown time  . metFORMIN (GLUCOPHAGE) 500 MG tablet Take 500 mg by mouth daily with breakfast.   07/07/2020 at Unknown time  . OVIDREL 250 MCG/0.5ML injection Inject into the skin.   september  . oxyCODONE-acetaminophen (PERCOCET/ROXICET) 5-325 MG tablet Take 2 tablets by mouth 4 (four) times daily as needed.   07/07/2020 at Unknown time  . promethazine-dextromethorphan (PROMETHAZINE-DM) 6.25-15 MG/5ML syrup Take 5 mLs by mouth 4 (four) times daily as needed for cough. 118 mL 0 07/07/2020 at Unknown time  . Vitamin D, Ergocalciferol, (DRISDOL) 50000 UNITS CAPS capsule Take 50,000 Units by mouth every  Saturday.    Past Week at Unknown time  . acetaminophen (TYLENOL) 500 MG tablet Take 500 mg by mouth every 6 (six) hours as needed for mild pain or moderate pain. (Patient not taking: Reported on 07/07/2020)   Not Taking at Unknown time  . cetirizine (ZYRTEC) 10 MG tablet Take 1 tablet (10 mg total) by mouth daily. (Patient not taking: No sig reported) 30 tablet 0 Not Taking at Unknown time  . FIBER ADULT GUMMIES PO Take 2 tablets by mouth at bedtime.  (Patient not taking: Reported on 07/07/2020)   Not Taking at Unknown time  . lidocaine (XYLOCAINE) 2 % solution Use as directed 15 mLs in the mouth or throat as needed for mouth pain (Do NOT exceed 8 doses in a 24 hour period). (Patient not taking: No sig reported) 100 mL 0 Not Taking at Unknown time  . metoprolol tartrate (LOPRESSOR) 25 MG tablet Take 1 as needed feeling your heart is racing, may repeat once in 30 minutes if needed (Patient not taking: No sig reported) 10 tablet 0 Not Taking at Unknown time  . Probiotic Product (PROBIOTIC DAILY PO) Take 1 tablet by mouth at bedtime.  (Patient not taking: Reported on 07/07/2020)   Not Taking at Unknown time    Review of Systems  All other systems reviewed and are negative.   Blood pressure 123/74, pulse 64, temperature 98 F (36.7 C), temperature source Oral, resp. rate 17, height 5\' 9"  (1.753 m), weight 129.3 kg, last menstrual period 05/03/2020, SpO2 98 %. Physical Exam Constitutional:      Appearance: She is well-developed.  HENT:     Head: Atraumatic.  Cardiovascular:     Rate and Rhythm: Regular rhythm.  Pulmonary:     Breath sounds: Normal breath sounds.  Abdominal:     General: There is no distension.     Palpations: Abdomen is soft.  Genitourinary:    Comments: deferred Skin:    General: Skin is warm and dry.  Neurological:     Mental Status: She is alert.     Results for orders placed or performed during the hospital encounter of 07/07/20 (from the past 24 hour(s))   Urinalysis, Routine w reflex microscopic Urine, Clean Catch     Status: Abnormal   Collection Time: 07/07/20 10:09 AM  Result Value Ref Range   Color, Urine YELLOW YELLOW   APPearance CLOUDY (A) CLEAR   Specific Gravity, Urine 1.028 1.005 - 1.030   pH 5.0 5.0 - 8.0   Glucose, UA NEGATIVE NEGATIVE mg/dL   Hgb urine dipstick LARGE (A) NEGATIVE   Bilirubin Urine NEGATIVE NEGATIVE   Ketones, ur NEGATIVE NEGATIVE mg/dL   Protein, ur 034100 (A) NEGATIVE mg/dL   Nitrite NEGATIVE NEGATIVE   Leukocytes,Ua SMALL (A) NEGATIVE   RBC / HPF >50 (H) 0 - 5 RBC/hpf   WBC,  UA >50 (H) 0 - 5 WBC/hpf   Bacteria, UA RARE (A) NONE SEEN   Squamous Epithelial / LPF 0-5 0 - 5   WBC Clumps PRESENT    Mucus PRESENT    Ca Oxalate Crys, UA PRESENT   CBC with Differential     Status: Abnormal   Collection Time: 07/07/20 10:31 AM  Result Value Ref Range   WBC 8.0 4.0 - 10.5 K/uL   RBC 4.55 3.87 - 5.11 MIL/uL   Hemoglobin 10.5 (L) 12.0 - 15.0 g/dL   HCT 02.5 (L) 85.2 - 77.8 %   MCV 71.6 (L) 80.0 - 100.0 fL   MCH 23.1 (L) 26.0 - 34.0 pg   MCHC 32.2 30.0 - 36.0 g/dL   RDW 24.2 (H) 35.3 - 61.4 %   Platelets 309 150 - 400 K/uL   nRBC 0.0 0.0 - 0.2 %   Neutrophils Relative % 61 %   Neutro Abs 5.0 1.7 - 7.7 K/uL   Lymphocytes Relative 27 %   Lymphs Abs 2.2 0.7 - 4.0 K/uL   Monocytes Relative 7 %   Monocytes Absolute 0.5 0.1 - 1.0 K/uL   Eosinophils Relative 3 %   Eosinophils Absolute 0.2 0.0 - 0.5 K/uL   Basophils Relative 1 %   Basophils Absolute 0.1 0.0 - 0.1 K/uL   Immature Granulocytes 1 %   Abs Immature Granulocytes 0.08 (H) 0.00 - 0.07 K/uL   Polychromasia PRESENT   Basic metabolic panel     Status: Abnormal   Collection Time: 07/07/20 10:31 AM  Result Value Ref Range   Sodium 137 135 - 145 mmol/L   Potassium 3.8 3.5 - 5.1 mmol/L   Chloride 107 98 - 111 mmol/L   CO2 22 22 - 32 mmol/L   Glucose, Bld 97 70 - 99 mg/dL   BUN 11 6 - 20 mg/dL   Creatinine, Ser 4.31 0.44 - 1.00 mg/dL   Calcium 8.4  (L) 8.9 - 10.3 mg/dL   GFR, Estimated >54 >00 mL/min   Anion gap 8 5 - 15  hCG, quantitative, pregnancy     Status: Abnormal   Collection Time: 07/07/20 10:32 AM  Result Value Ref Range   hCG, Beta Chain, Quant, S 199 (H) <5 mIU/mL  Wet prep, genital     Status: Abnormal   Collection Time: 07/07/20  3:07 PM   Specimen: PATH Cytology Cervicovaginal Ancillary Only  Result Value Ref Range   Yeast Wet Prep HPF POC NONE SEEN NONE SEEN   Trich, Wet Prep NONE SEEN NONE SEEN   Clue Cells Wet Prep HPF POC NONE SEEN NONE SEEN   WBC, Wet Prep HPF POC RARE (A) NONE SEEN   Sperm NONE SEEN   POC CBG, ED     Status: None   Collection Time: 07/07/20  3:34 PM  Result Value Ref Range   Glucose-Capillary 82 70 - 99 mg/dL  Resp Panel by RT-PCR (Flu A&B, Covid) Nasopharyngeal Swab     Status: None   Collection Time: 07/07/20  3:39 PM   Specimen: Nasopharyngeal Swab; Nasopharyngeal(NP) swabs in vial transport medium  Result Value Ref Range   SARS Coronavirus 2 by RT PCR NEGATIVE NEGATIVE   Influenza A by PCR NEGATIVE NEGATIVE   Influenza B by PCR NEGATIVE NEGATIVE  CBG monitoring, ED     Status: None   Collection Time: 07/07/20  5:18 PM  Result Value Ref Range   Glucose-Capillary 85 70 - 99 mg/dL    US PELVIC  COMPLETE W TRANSVAGINAL AND TORSION R/O  Result Date: 07/07/2020 CLINICAL DATA:  Pelvic pain. History of polycystic ovarian disease. Spontaneous abortion 1 week ago. EXAM: TRANSABDOMINAL AND TRANSVAGINAL ULTRASOUND OF PELVIS DOPPLER ULTRASOUND OF OVARIES TECHNIQUE: Both transabdominal and transvaginal ultrasound examinations of the pelvis were performed. Transabdominal technique was performed for global imaging of the pelvis including uterus, ovaries, adnexal regions, and pelvic cul-de-sac. It was necessary to proceed with endovaginal exam following the transabdominal exam to visualize the uterus, ovaries, and adnexa. Color and duplex Doppler ultrasound was utilized to evaluate blood flow to the  ovaries. COMPARISON:  03/30/2018 from [facility] [place] FINDINGS: Uterus Measurements: 13.0 x 6.9 by 5.6 cm = volume: 266 mL. No fibroids or other mass visualized. Endometrium Thickness: 13 mm. Focal endometrial thickening within the lower uterine segment/cervix of up to 2.2 cm, including on image 25. Suggestion of vascularity on image 26. Right ovary Measurements: 4.3 x 3.0 x 2.9 cm = volume: 19 mL. Positioned high within the right pelvis. Only visualized transabdominally. Left ovary Not visualized Pulsed Doppler evaluation of the right ovary demonstrates normal color signal and arterial/venous spectral tracings. Other findings No abnormal free fluid. Exam degraded by patient body habitus. IMPRESSION: 1. Focal endometrial thickening within the lower uterine segment and cervix. Cannot exclude retained products of conception, given suggestion of vascularity within. 2. Lack of visualization of the left ovary. Electronically Signed   By: Jeronimo Greaves M.D.   On: 07/07/2020 11:49    Assessment/Plan: IMP: retained POC/incompleted AB P) u/s guided suction D&C. Procedure explained. Risk of surgery reviewed with pt including infection, bleeding, injury to surrounding organ structures, internal scar tissue, uterine perforation and its risk. All ? Answered.   Deborah Riddle A Deborah Riddle 07/07/2020, 6:47 PM

## 2020-07-07 NOTE — Anesthesia Preprocedure Evaluation (Signed)
Anesthesia Evaluation  Patient identified by MRN, date of birth, ID band Patient awake    Reviewed: Allergy & Precautions, NPO status , Patient's Chart, lab work & pertinent test results  Airway Mallampati: I  TM Distance: >3 FB Neck ROM: Full    Dental   Pulmonary former smoker,    Pulmonary exam normal        Cardiovascular Normal cardiovascular exam+ dysrhythmias Atrial Fibrillation      Neuro/Psych    GI/Hepatic GERD  Medicated and Controlled,  Endo/Other    Renal/GU      Musculoskeletal   Abdominal   Peds  Hematology   Anesthesia Other Findings   Reproductive/Obstetrics                             Anesthesia Physical Anesthesia Plan  ASA: III  Anesthesia Plan: General   Post-op Pain Management:    Induction: Intravenous, Rapid sequence and Cricoid pressure planned  PONV Risk Score and Plan: 3 and Midazolam, Ondansetron and Treatment may vary due to age or medical condition  Airway Management Planned: Oral ETT  Additional Equipment:   Intra-op Plan:   Post-operative Plan: Extubation in OR  Informed Consent: I have reviewed the patients History and Physical, chart, labs and discussed the procedure including the risks, benefits and alternatives for the proposed anesthesia with the patient or authorized representative who has indicated his/her understanding and acceptance.       Plan Discussed with: CRNA and Surgeon  Anesthesia Plan Comments:         Anesthesia Quick Evaluation

## 2020-07-07 NOTE — Op Note (Signed)
NAME: Deborah Riddle, Deborah Riddle MEDICAL RECORD AG:[id number] ACCOUNT [id number] DATE OF BIRTH:[birth year] FACILITY: [facility] LOCATION: [facility] PHYSICIAN:Jadie Comas A. Blakeley Scheier, MD  OPERATIVE REPORT  DATE OF PROCEDURE:  07/07/2020  PREOPERATIVE DIAGNOSIS:  Incomplete abortion/retained products of conception.  PROCEDURE:  Ultrasound-guided suction dilation and curettage.  POSTOPERATIVE DIAGNOSIS:  Incomplete abortion/retained products of conception.  ANESTHESIA:  General, paracervical block.  SURGEON:  Maxie Better, MD  ASSISTANT:  None.  DESCRIPTION OF PROCEDURE:  Under adequate general anesthesia, the patient was placed in the dorsal lithotomy position.  The bladder was not catheterized due to the planned ultrasound.  Examination under anesthesia revealed about a 10-week size uterus.   No adnexal masses could be appreciated, but the bladder was also full.  Bivalve speculum was placed in the vagina.  Tissue was noted at the cervical os. Paracervical block was done using Nesacaine. The cervix easily accepted a #21 Pratt dilator.  A single tooth tenaculum was placed on the anterior  lip of  the cervix.  A #7 mm curved suction cannula was introduced into the uterine cavity under ultrasound guidance.  Moderate amount of tissue was obtained.  The procedure was continued until endometrial stripe was noted on the ultrasound.  A small curette was then used to gently curette the uterine cavity and when all tissue was felt to have been removed, all instruments were then removed from the vagina.  The patient was then catheterized for moderate amount of urine.  SPECIMEN:  Products of conception sent to pathology.  ESTIMATED BLOOD LOSS:  5 mL.  COMPLICATIONS:  None.  DISPOSITION:  The patient tolerated the procedure well and was transferred to recovery room in stable condition.  HN/NUANCE  D:07/07/2020 T:07/07/2020 JOB:[id number]

## 2020-07-08 ENCOUNTER — Encounter (HOSPITAL_COMMUNITY): Payer: Self-pay | Admitting: Obstetrics and Gynecology

## 2020-07-10 LAB — GC/CHLAMYDIA PROBE AMP (~~LOC~~) NOT AT ARMC
Chlamydia: NEGATIVE
Comment: NEGATIVE
Comment: NORMAL
Neisseria Gonorrhea: NEGATIVE

## 2020-07-11 LAB — SURGICAL PATHOLOGY

## 2020-07-21 ENCOUNTER — Telehealth: Payer: Self-pay | Admitting: Infectious Diseases

## 2020-07-21 ENCOUNTER — Other Ambulatory Visit: Payer: Self-pay | Admitting: Infectious Diseases

## 2020-07-21 DIAGNOSIS — U071 COVID-19: Secondary | ICD-10-CM

## 2020-07-21 NOTE — Progress Notes (Signed)
I connected by phone with Deborah Riddle on 07/21/2020 at 12:59 PM to discuss the potential use of a new treatment for mild to moderate COVID-19 viral infection in non-hospitalized patients.  This patient is a 35 y.o. female that meets the FDA criteria for Emergency Use Authorization of COVID monoclonal antibody sotrovimab.  Has a (+) direct SARS-CoV-2 viral test result  Has mild or moderate COVID-19   Is NOT hospitalized due to COVID-19  Is within 10 days of symptom onset  Has at least one of the high risk factor(s) for progression to severe COVID-19 and/or hospitalization as defined in EUA.  Specific high risk criteria : BMI > 25 and Other high risk medical condition per CDC:  unvaccinated, svi score 4   I have spoken and communicated the following to the patient or parent/caregiver regarding COVID monoclonal antibody treatment:  1. FDA has authorized the emergency use for the treatment of mild to moderate COVID-19 in adults and pediatric patients with positive results of direct SARS-CoV-2 viral testing who are 41 years of age and older weighing at least 40 kg, and who are at high risk for progressing to severe COVID-19 and/or hospitalization.  2. The significant known and potential risks and benefits of COVID monoclonal antibody, and the extent to which such potential risks and benefits are unknown.  3. Information on available alternative treatments and the risks and benefits of those alternatives, including clinical trials.  4. Patients treated with COVID monoclonal antibody should continue to self-isolate and use infection control measures (e.g., wear mask, isolate, social distance, avoid sharing personal items, clean and disinfect "high touch" surfaces, and frequent handwashing) according to CDC guidelines.   5. The patient or parent/caregiver has the option to accept or refuse COVID monoclonal antibody treatment.  After reviewing this information with the patient, the patient  has agreed to receive one of the available covid 19 monoclonal antibodies and will be provided an appropriate fact sheet prior to infusion. Rexene Alberts, NP 07/21/2020 12:59 PM

## 2020-07-21 NOTE — Telephone Encounter (Signed)
Called to discuss with patient about COVID-19 symptoms and the use of one of the available treatments for those with mild to moderate Covid symptoms and at a high risk of hospitalization.  Pt appears to qualify for outpatient treatment due to co-morbid conditions and/or a member of an at-risk group in accordance with the FDA Emergency Use Authorization.    Symptom onset: 1/3 Vaccinated: no  Booster? no Qualifiers: BMI, SVI score 4, previous smoking history   Discussed treatment options with her - she would prefer MAB over paxlovid. I agree with h/o of no vaccine this is the best option for her. Will proceed with sceduling  Rexene Alberts

## 2020-07-22 ENCOUNTER — Ambulatory Visit (HOSPITAL_COMMUNITY)
Admission: RE | Admit: 2020-07-22 | Discharge: 2020-07-22 | Disposition: A | Discharge status: Home / Self Care | Payer: BC Managed Care – PPO | Source: Ambulatory Visit | Attending: Pulmonary Disease | Admitting: Pulmonary Disease

## 2020-07-22 DIAGNOSIS — U071 COVID-19: Secondary | ICD-10-CM | POA: Insufficient documentation

## 2020-07-22 MED ORDER — ONDANSETRON HCL 4 MG/2ML IJ SOLN
4.0000 mg | Freq: Once | INTRAMUSCULAR | Status: DC
  Filled 2020-07-22: qty 2

## 2020-07-22 MED ORDER — FAMOTIDINE IN NACL 20-0.9 MG/50ML-% IV SOLN: 20.0000 mg | Freq: Once | INTRAVENOUS | Status: DC | PRN

## 2020-07-22 MED ORDER — FAMOTIDINE IN NACL 20-0.9 MG/50ML-% IV SOLN: 20.0000 mg | Freq: Once | INTRAVENOUS | Status: DC

## 2020-07-22 MED ORDER — METHYLPREDNISOLONE SODIUM SUCC 125 MG IJ SOLR: 125.0000 mg | Freq: Once | INTRAMUSCULAR | Status: DC | PRN

## 2020-07-22 MED ORDER — ALBUTEROL SULFATE HFA 108 (90 BASE) MCG/ACT IN AERS: 2.0000 | INHALATION_SPRAY | Freq: Once | RESPIRATORY_TRACT | Status: DC | PRN

## 2020-07-22 MED ORDER — SODIUM CHLORIDE 0.9 % IV SOLN: INTRAVENOUS | Status: DC | PRN

## 2020-07-22 MED ORDER — SOTROVIMAB 500 MG/8ML IV SOLN
500.0000 mg | Freq: Once | INTRAVENOUS | Status: AC
  Administered 2020-07-22: 500 mg via INTRAVENOUS

## 2020-07-22 MED ORDER — DIPHENHYDRAMINE HCL 50 MG/ML IJ SOLN: 50.0000 mg | Freq: Once | INTRAMUSCULAR | Status: DC | PRN

## 2020-07-22 MED ORDER — EPINEPHRINE 0.3 MG/0.3ML IJ SOAJ: 0.3000 mg | Freq: Once | INTRAMUSCULAR | Status: DC | PRN

## 2020-07-22 NOTE — Progress Notes (Signed)
Patient reviewed Fact Sheet for Patients, Parents, and Caregivers for Emergency Use Authorization (EUA) of Sotrovimab for the Treatment of Coronavirus. Patient also reviewed and is agreeable to the estimated cost of treatment. Patient is agreeable to proceed.   

## 2020-07-22 NOTE — Discharge Instructions (Signed)
10 Things You Can Do to Manage Your COVID-19 Symptoms at Home If you have possible or confirmed COVID-19: 1. Stay home from work and school. And stay away from other public places. If you must go out, avoid using any kind of public transportation, ridesharing, or taxis. 2. Monitor your symptoms carefully. If your symptoms get worse, call your healthcare provider immediately. 3. Get rest and stay hydrated. 4. If you have a medical appointment, call the healthcare provider ahead of time and tell them that you have or may have COVID-19. 5. For medical emergencies, call 911 and notify the dispatch personnel that you have or may have COVID-19. 6. Cover your cough and sneezes with a tissue or use the inside of your elbow. 7. Wash your hands often with soap and water for at least 20 seconds or clean your hands with an alcohol-based hand sanitizer that contains at least 60% alcohol. 8. As much as possible, stay in a specific room and away from other people in your home. Also, you should use a separate bathroom, if available. If you need to be around other people in or outside of the home, wear a mask. 9. Avoid sharing personal items with other people in your household, like dishes, towels, and bedding. 10. Clean all surfaces that are touched often, like counters, tabletops, and doorknobs. Use household cleaning sprays or wipes according to the label instructions. cdc.gov/coronavirus 01/13/2019 This information is not intended to replace advice given to you by your health care provider. Make sure you discuss any questions you have with your health care provider. Document Revised: 06/17/2019 Document Reviewed: 06/17/2019 Elsevier Patient Education  2020 Elsevier Inc. What types of side effects do monoclonal antibody drugs cause?  Common side effects  In general, the more common side effects caused by monoclonal antibody drugs include: . Allergic reactions, such as hives or itching . Flu-like signs and  symptoms, including chills, fatigue, fever, and muscle aches and pains . Nausea, vomiting . Diarrhea . Skin rashes . Low blood pressure   The CDC is recommending patients who receive monoclonal antibody treatments wait at least 90 days before being vaccinated.  Currently, there are no data on the safety and efficacy of mRNA COVID-19 vaccines in persons who received monoclonal antibodies or convalescent plasma as part of COVID-19 treatment. Based on the estimated half-life of such therapies as well as evidence suggesting that reinfection is uncommon in the 90 days after initial infection, vaccination should be deferred for at least 90 days, as a precautionary measure until additional information becomes available, to avoid interference of the antibody treatment with vaccine-induced immune responses. If you have any questions or concerns after the infusion please call the Advanced Practice Provider on call at [phone]. This number is ONLY intended for your use regarding questions or concerns about the infusion post-treatment side-effects.  Please do not provide this number to others for use. For return to work notes please contact your primary care provider.   If someone you know is interested in receiving treatment please have them call the COVID hotline at [phone].   

## 2020-07-22 NOTE — Progress Notes (Addendum)
Diagnosis: COVID-19  Physician: Dr. Patrick Wright  Procedure: Covid Infusion Clinic Med: Sotrovimab infusion - Provided patient with sotrovimab fact sheet for patients, parents, and caregivers prior to infusion.   Complications: No immediate complications noted  Discharge: Discharged home    

## 2021-02-04 ENCOUNTER — Emergency Department (HOSPITAL_COMMUNITY): Payer: BC Managed Care – PPO

## 2021-02-04 ENCOUNTER — Emergency Department (HOSPITAL_COMMUNITY)
Admission: EM | Admit: 2021-02-04 | Discharge: 2021-02-04 | Disposition: A | Discharge status: Home / Self Care | Payer: BC Managed Care – PPO | Attending: Emergency Medicine | Admitting: Emergency Medicine

## 2021-02-04 ENCOUNTER — Encounter (HOSPITAL_COMMUNITY): Payer: Self-pay | Admitting: Emergency Medicine

## 2021-02-04 ENCOUNTER — Other Ambulatory Visit: Payer: Self-pay

## 2021-02-04 DIAGNOSIS — Z7984 Long term (current) use of oral hypoglycemic drugs: Secondary | ICD-10-CM | POA: Insufficient documentation

## 2021-02-04 DIAGNOSIS — Z87891 Personal history of nicotine dependence: Secondary | ICD-10-CM | POA: Diagnosis not present

## 2021-02-04 DIAGNOSIS — M5412 Radiculopathy, cervical region: Secondary | ICD-10-CM | POA: Diagnosis not present

## 2021-02-04 DIAGNOSIS — R202 Paresthesia of skin: Secondary | ICD-10-CM | POA: Insufficient documentation

## 2021-02-04 DIAGNOSIS — R200 Anesthesia of skin: Secondary | ICD-10-CM | POA: Diagnosis not present

## 2021-02-04 DIAGNOSIS — R002 Palpitations: Secondary | ICD-10-CM | POA: Diagnosis not present

## 2021-02-04 LAB — CBC WITH DIFFERENTIAL/PLATELET
Abs Immature Granulocytes: 0.06 10*3/uL (ref 0.00–0.07)
Basophils Absolute: 0 10*3/uL (ref 0.0–0.1)
Basophils Relative: 1 %
Eosinophils Absolute: 0.1 10*3/uL (ref 0.0–0.5)
Eosinophils Relative: 1 %
HCT: 39.8 % (ref 36.0–46.0)
Hemoglobin: 13.1 g/dL (ref 12.0–15.0)
Immature Granulocytes: 1 %
Lymphocytes Relative: 32 %
Lymphs Abs: 2.6 10*3/uL (ref 0.7–4.0)
MCH: 24.3 pg — ABNORMAL LOW (ref 26.0–34.0)
MCHC: 32.9 g/dL (ref 30.0–36.0)
MCV: 73.8 fL — ABNORMAL LOW (ref 80.0–100.0)
Monocytes Absolute: 0.5 10*3/uL (ref 0.1–1.0)
Monocytes Relative: 7 %
Neutro Abs: 4.8 10*3/uL (ref 1.7–7.7)
Neutrophils Relative %: 58 %
Platelets: 192 10*3/uL (ref 150–400)
RBC: 5.39 MIL/uL — ABNORMAL HIGH (ref 3.87–5.11)
RDW: 17.2 % — ABNORMAL HIGH (ref 11.5–15.5)
WBC: 8.1 10*3/uL (ref 4.0–10.5)
nRBC: 0 % (ref 0.0–0.2)

## 2021-02-04 LAB — COMPREHENSIVE METABOLIC PANEL
ALT: 17 U/L (ref 0–44)
AST: 20 U/L (ref 15–41)
Albumin: 3.5 g/dL (ref 3.5–5.0)
Alkaline Phosphatase: 81 U/L (ref 38–126)
Anion gap: 6 (ref 5–15)
BUN: 10 mg/dL (ref 6–20)
CO2: 26 mmol/L (ref 22–32)
Calcium: 8.9 mg/dL (ref 8.9–10.3)
Chloride: 105 mmol/L (ref 98–111)
Creatinine, Ser: 0.79 mg/dL (ref 0.44–1.00)
GFR, Estimated: >60 mL/min (ref >60)
Glucose, Bld: 111 mg/dL — ABNORMAL HIGH (ref 70–99)
Potassium: 3.9 mmol/L (ref 3.5–5.1)
Sodium: 137 mmol/L (ref 135–145)
Total Bilirubin: 0.4 mg/dL (ref 0.3–1.2)
Total Protein: 7.2 g/dL (ref 6.5–8.1)

## 2021-02-04 LAB — MAGNESIUM: Magnesium: 1.9 mg/dL (ref 1.7–2.4)

## 2021-02-04 LAB — HCG, SERUM, QUALITATIVE: Preg, Serum: NEGATIVE

## 2021-02-04 MED ORDER — METHYLPREDNISOLONE 4 MG PO TBPK: ORAL_TABLET | ORAL | 0 refills | Status: DC

## 2021-02-04 NOTE — ED Triage Notes (Signed)
Pt with tingling in L arm that started last night.

## 2021-02-04 NOTE — ED Provider Notes (Signed)
7:30 AM-checkout from Dr. Consuella Lose to evaluate patient after CT imaging, which was done to determine a cause of her left arm tingling.  He was concerned that she had a cervical radiculopathy.  She was not weak and did not have signs and symptoms for CVA.  8:45 AM at this time the patient is alert and comfortable.  I discussed the findings of the CT scan which were normal.  The patient was reassured and felt better after hearing that.  She plans on completing the treatment plans as outlined on the AVS.  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   Mancel Bale, MD 02/04/21 [phone]

## 2021-02-04 NOTE — ED Provider Notes (Addendum)
[facility] EMERGENCY DEPARTMENT Provider Note   CSN: [id number] Arrival date & time: 02/04/21  4174     History Chief Complaint  Patient presents with   Tingling in L arm    Deborah Riddle is a 35 y.o. female.  Patient reports left arm numbness and tingling sensation since approximately 12 midnight.  Denies any fall or injury.  Denies any lifting.  She describes "feels like her arm is asleep and cannot wake up".  But denies pins-and-needles sensation.  Numbness is from her shoulder to her wrist and does not involve her hands or fingers.  Does not involve shoulder or neck. denies any weakness.  Denies any headache, visual changes, chest pain or shortness of breath.  No numbness or tingling to her face or legs.  She is never had this before.  Denies any difficulty speaking or difficulty swallowing.  Denies any weakness in her arm.  Denies any neck or upper back pain.  Denies any headache.  Denies any abdominal pain. Past medical history includes obesity, atrial fibrillation not on any medications, ovarian cyst, sickle cell trait.  The history is provided by the patient.      Past Medical History:  Diagnosis Date   Body mass index 40.0-44.9, adult (HCC)    Chest pain, unspecified    Dysrhythmia    AFib   GERD (gastroesophageal reflux disease)    Morbid obesity (HCC)    Paroxysmal atrial fibrillation (HCC)    Polycystic disease, ovaries    Sickle cell trait (HCC)    Tobacco use disorder     Patient Active Problem List   Diagnosis Date Noted   Incomplete spontaneous abortion 07/07/2020   Dyspepsia    Wound dehiscence 06/18/2018   Hidradenitis 05/19/2018   Abdominal pain 05/13/2018   Morbid obesity (HCC) 12/22/2014   Chest pain 09/23/2011   Paroxysmal atrial fibrillation (HCC)    Palpitations 03/23/2009    Past Surgical History:  Procedure Laterality Date   BIOPSY  08/07/2018   Procedure: BIOPSY;  Surgeon: West Bali, MD;  [facility]: [facility];  Service:  Endoscopy;;  gastric bx's   DILATION AND EVACUATION  07/07/2020   Procedure: DILATATION AND EVACUATION;  Surgeon: Maxie Better, MD;  Location: [facility] OR;  Service: Gynecology;;   ESOPHAGOGASTRODUODENOSCOPY N/A 08/07/2018   Procedure: ESOPHAGOGASTRODUODENOSCOPY (EGD);  Surgeon: West Bali, MD;  Location: [facility];  Service: Endoscopy;  Laterality: N/A;  3:00pm   HYDRADENITIS EXCISION Right 05/29/2018   Procedure: EXCISION HIDRADENITIS AXILLA;  Surgeon: Lucretia Roers, MD;  Location: [facility] ORS;  Service: General;  Laterality: Right;   MALONEY DILATION N/A 08/07/2018   Procedure: Alvy Beal;  Surgeon: West Bali, MD;  Location: [facility];  Service: Endoscopy;  Laterality: N/A;   none     SAVORY DILATION N/A 08/07/2018   Procedure: SAVORY DILATION;  Surgeon: West Bali, MD;  Location: [facility];  Service: Endoscopy;  Laterality: N/A;   WISDOM TOOTH EXTRACTION       OB History     Gravida  2   Para      Term      Preterm      AB  2   Living  0      SAB  2   IAB      Ectopic      Multiple      Live Births              Family  History  Problem Relation Age of Onset   Arrhythmia Mother 5250       History of Cardiac Stents   Hypertension Mother    Diabetes Mother    COPD Mother    Colon polyps Mother    Hypertension Father    Diabetes Other        Family History   Hypertension Other        Family History   Diabetes Sister    Hypertension Sister    Colon cancer Maternal Uncle    Crohn's disease Cousin     Social History   Tobacco Use   Smoking status: Former    Packs/day: 1.00    Years: 12.00    Pack years: 12.00    Types: Cigarettes    Start date: 01/21/2004    Quit date: 09/10/2011    Years since quitting: 9.4   Smokeless tobacco: Never  Vaping Use   Vaping Use: Never used  Substance Use Topics   Alcohol use: Never    Alcohol/week: 0.0 standard drinks   Drug use: No    Home Medications Prior to Admission  medications   Medication Sig Start Date End Date Taking? Authorizing Provider  acetaminophen (TYLENOL) 500 MG tablet Take 500 mg by mouth every 6 (six) hours as needed for mild pain or moderate pain. Patient not taking: Reported on 07/07/2020    [provider]  ibuprofen (ADVIL) 800 MG tablet Take 800 mg by mouth every 8 (eight) hours as needed.    [provider]  loratadine (CLARITIN) 10 MG tablet Take 10 mg by mouth daily.    [provider]  metFORMIN (GLUCOPHAGE) 500 MG tablet Take 500 mg by mouth daily with breakfast. 10/07/19   [provider]  metoprolol tartrate (LOPRESSOR) 25 MG tablet Take 1 as needed feeling your heart is racing, may repeat once in 30 minutes if needed Patient not taking: No sig reported 12/02/19   Devoria AlbeKnapp, Iva, MD  oxyCODONE-acetaminophen (PERCOCET/ROXICET) 5-325 MG tablet Take 2 tablets by mouth 4 (four) times daily as needed. 06/27/20   [provider]  Vitamin D, Ergocalciferol, (DRISDOL) 50000 UNITS CAPS capsule Take 50,000 Units by mouth every Saturday.  04/22/14   [provider]    Allergies    Dificid [fidaxomicin], Amoxicillin, Clindamycin/lincomycin, and Heparin  Review of Systems   Review of Systems  Constitutional:  Negative for activity change, appetite change and fever.  HENT:  Negative for congestion.   Respiratory:  Negative for cough, chest tightness and shortness of breath.   Cardiovascular:  Negative for chest pain.  Gastrointestinal:  Negative for abdominal pain, nausea and vomiting.  Genitourinary:  Negative for dysuria and hematuria.  Musculoskeletal:  Negative for arthralgias and myalgias.  Skin:  Negative for wound.  Neurological:  Positive for numbness. Negative for dizziness, weakness, light-headedness and headaches.   all other systems are negative except as noted in the HPI and PMH.   Physical Exam Updated Vital Signs BP (!) 146/76 (BP Location: Left Arm)   Pulse 86   Temp 97.8  F (36.6 C) (Oral)   Resp 18   Ht 5\' 9"  (1.753 m)   Wt (!) 140.2 kg   LMP 01/10/2021 (Approximate)   SpO2 97%   BMI 45.63 kg/m   Physical Exam Vitals and nursing note reviewed.  Constitutional:      General: She is not in acute distress.    Appearance: She is well-developed.  HENT:     Head:  Normocephalic and atraumatic.     Mouth/Throat:     Pharynx: No oropharyngeal exudate.  Eyes:     Conjunctiva/sclera: Conjunctivae normal.     Pupils: Pupils are equal, round, and reactive to light.  Neck:     Comments: No meningismus. Cardiovascular:     Rate and Rhythm: Normal rate and regular rhythm.     Heart sounds: Normal heart sounds. No murmur heard. Pulmonary:     Effort: Pulmonary effort is normal. No respiratory distress.     Breath sounds: Normal breath sounds.  Abdominal:     Palpations: Abdomen is soft.     Tenderness: There is no abdominal tenderness. There is no guarding or rebound.  Musculoskeletal:        General: No tenderness. Normal range of motion.     Cervical back: Normal range of motion and neck supple.     Comments: Intact radial pulse on the left, intact cardinal hand movements. 5/5 strength bilaterally.  No pronator drift. Tinel's and Phalen signs negative.  Skin:    General: Skin is warm.  Neurological:     Mental Status: She is alert and oriented to person, place, and time.     Cranial Nerves: No cranial nerve deficit.     Motor: No abnormal muscle tone.     Coordination: Coordination normal.     Comments:  5/5 strength throughout. CN 2-12 intact.Equal grip strength.   Subjective paresthesias involving left arm from shoulder to wrist not involving hand.  However she states this sensation feels the same compared to the right side. Equal grip strength bilaterally.  No pronator drift.  No facial droop.  Psychiatric:        Behavior: Behavior normal.    ED Results / Procedures / Treatments   Labs (all labs ordered are listed, but only abnormal  results are displayed) Labs Reviewed  CBC WITH DIFFERENTIAL/PLATELET  COMPREHENSIVE METABOLIC PANEL  MAGNESIUM  HCG, SERUM, QUALITATIVE    EKG EKG Interpretation  Date/Time:  Sunday February 04 2021 06:04:05 EDT Ventricular Rate:  74 PR Interval:  164 QRS Duration: 83 QT Interval:  387 QTC Calculation: 430 R Axis:   91 Text Interpretation: Sinus rhythm Borderline right axis deviation Low voltage, precordial leads Borderline T abnormalities, diffuse leads Baseline wander in lead(s) II No significant change was found Confirmed by Glynn Octave [phone]) on 02/04/2021 6:10:12 AM  Radiology No results found.  Procedures Procedures   Medications Ordered in ED Medications - No data to display  ED Course  I have reviewed the triage vital signs and the nursing notes.  Pertinent labs & imaging results that were available during my care of the patient were reviewed by me and considered in my medical decision making (see chart for details).    MDM Rules/Calculators/A&P                          Paresthesias and numbness of the left arm since 12 midnight.  No weakness.  No neck pain or headache. Neurovascularly intact.  Exam not consistent with carpal tunnel syndrome. Consider cervical radiculopathy.  Low suspicion for stroke, TIA, ACS.  MRI not available. She is not interested in being transferred for emergent MRI today. CT scan will be obtained instead to evaluate for any possible cervical radiculopathy.  Care To be transferred at shift change with labs and imaging pending. Anticipate discharge home with outpatient PCP and neurology follow-up for MRI.  We will treat empirically  with steroids for suspected cervical radiculopathy. Final Clinical Impression(s) / ED Diagnoses Final diagnoses:  None    Rx / DC Orders ED Discharge Orders     None        Jenefer Woerner, Jeannett Senior, MD 02/04/21 2080    Glynn Octave, MD 02/04/21 [phone]

## 2021-02-04 NOTE — ED Notes (Signed)
Patient transported to CT 

## 2021-02-04 NOTE — Discharge Instructions (Addendum)
Your testing today is reassuring.  As we discussed, we suspect you could have a pinched nerve in your neck but an MRI is a better test to evaluate this.  Your doctor can schedule this or the neurologist can schedule this.  Take the steroids as prescribed.  Return to ED with worsening weakness, numbness, tingling, difficulty breathing, chest pain, or any other concerns.

## 2021-03-01 DIAGNOSIS — L309 Dermatitis, unspecified: Secondary | ICD-10-CM | POA: Diagnosis not present

## 2021-03-01 DIAGNOSIS — Z299 Encounter for prophylactic measures, unspecified: Secondary | ICD-10-CM | POA: Diagnosis not present

## 2021-03-01 DIAGNOSIS — E559 Vitamin D deficiency, unspecified: Secondary | ICD-10-CM | POA: Diagnosis not present

## 2021-03-01 DIAGNOSIS — D509 Iron deficiency anemia, unspecified: Secondary | ICD-10-CM | POA: Diagnosis not present

## 2021-03-01 DIAGNOSIS — I4891 Unspecified atrial fibrillation: Secondary | ICD-10-CM | POA: Diagnosis not present

## 2021-03-06 DIAGNOSIS — N938 Other specified abnormal uterine and vaginal bleeding: Secondary | ICD-10-CM | POA: Diagnosis not present

## 2021-03-06 DIAGNOSIS — Z01419 Encounter for gynecological examination (general) (routine) without abnormal findings: Secondary | ICD-10-CM | POA: Diagnosis not present

## 2021-03-06 DIAGNOSIS — N96 Recurrent pregnancy loss: Secondary | ICD-10-CM | POA: Diagnosis not present

## 2021-03-06 DIAGNOSIS — R87810 Cervical high risk human papillomavirus (HPV) DNA test positive: Secondary | ICD-10-CM | POA: Diagnosis not present

## 2021-04-20 ENCOUNTER — Emergency Department (HOSPITAL_COMMUNITY)
Admission: EM | Admit: 2021-04-20 | Discharge: 2021-04-20 | Disposition: A | Discharge status: Home / Self Care | Payer: BC Managed Care – PPO | Attending: Emergency Medicine | Admitting: Emergency Medicine

## 2021-04-20 ENCOUNTER — Other Ambulatory Visit: Payer: Self-pay

## 2021-04-20 ENCOUNTER — Emergency Department (HOSPITAL_COMMUNITY): Payer: BC Managed Care – PPO

## 2021-04-20 ENCOUNTER — Encounter (HOSPITAL_COMMUNITY): Payer: Self-pay

## 2021-04-20 DIAGNOSIS — Z87891 Personal history of nicotine dependence: Secondary | ICD-10-CM | POA: Insufficient documentation

## 2021-04-20 DIAGNOSIS — Z20822 Contact with and (suspected) exposure to covid-19: Secondary | ICD-10-CM | POA: Insufficient documentation

## 2021-04-20 DIAGNOSIS — I4891 Unspecified atrial fibrillation: Secondary | ICD-10-CM | POA: Insufficient documentation

## 2021-04-20 LAB — CBC
HCT: 43.5 % (ref 36.0–46.0)
Hemoglobin: 14.6 g/dL (ref 12.0–15.0)
MCH: 24.8 pg — ABNORMAL LOW (ref 26.0–34.0)
MCHC: 33.6 g/dL (ref 30.0–36.0)
MCV: 74 fL — ABNORMAL LOW (ref 80.0–100.0)
Platelets: 227 10*3/uL (ref 150–400)
RBC: 5.88 MIL/uL — ABNORMAL HIGH (ref 3.87–5.11)
RDW: 18.2 % — ABNORMAL HIGH (ref 11.5–15.5)
WBC: 9.4 10*3/uL (ref 4.0–10.5)
nRBC: 0 % (ref 0.0–0.2)

## 2021-04-20 LAB — RESP PANEL BY RT-PCR (FLU A&B, COVID) ARPGX2
Influenza A by PCR: NEGATIVE
Influenza B by PCR: NEGATIVE
SARS Coronavirus 2 by RT PCR: NEGATIVE

## 2021-04-20 LAB — COMPREHENSIVE METABOLIC PANEL
ALT: 17 U/L (ref 0–44)
AST: 21 U/L (ref 15–41)
Albumin: 3.8 g/dL (ref 3.5–5.0)
Alkaline Phosphatase: 90 U/L (ref 38–126)
Anion gap: 7 (ref 5–15)
BUN: 12 mg/dL (ref 6–20)
CO2: 25 mmol/L (ref 22–32)
Calcium: 8.9 mg/dL (ref 8.9–10.3)
Chloride: 109 mmol/L (ref 98–111)
Creatinine, Ser: 0.91 mg/dL (ref 0.44–1.00)
GFR, Estimated: >60 mL/min (ref >60)
Glucose, Bld: 106 mg/dL — ABNORMAL HIGH (ref 70–99)
Potassium: 3.8 mmol/L (ref 3.5–5.1)
Sodium: 141 mmol/L (ref 135–145)
Total Bilirubin: 0.5 mg/dL (ref 0.3–1.2)
Total Protein: 7.7 g/dL (ref 6.5–8.1)

## 2021-04-20 LAB — MAGNESIUM: Magnesium: 2 mg/dL (ref 1.7–2.4)

## 2021-04-20 MED ORDER — ETOMIDATE 2 MG/ML IV SOLN
10.0000 mg | Freq: Once | INTRAVENOUS | Status: DC
  Filled 2021-04-20: qty 10

## 2021-04-20 MED ORDER — DILTIAZEM HCL 25 MG/5ML IV SOLN
20.0000 mg | Freq: Once | INTRAVENOUS | Status: AC
  Administered 2021-04-20: 20 mg via INTRAVENOUS
  Filled 2021-04-20: qty 5

## 2021-04-20 MED ORDER — DILTIAZEM HCL ER BEADS 120 MG PO CP24: 120.0000 mg | ORAL_CAPSULE | Freq: Every day | ORAL | 1 refills | Status: DC

## 2021-04-20 MED ORDER — SODIUM CHLORIDE 0.9 % IV BOLUS
1000.0000 mL | Freq: Once | INTRAVENOUS | Status: AC
  Administered 2021-04-20: 1000 mL via INTRAVENOUS

## 2021-04-20 NOTE — ED Provider Notes (Signed)
[facility] Emergency Department Provider Note MRN:  [id number]  Arrival date & time: 04/20/21     Chief Complaint   Atrial Fibrillation   History of Present Illness   Deborah Riddle is a 35 y.o. year-old female with a history of paroxysmal A. fib presenting to the ED with chief complaint of A. fib.  Location: Chest Duration: 2 hours Onset: Sudden Timing: Constant Description: Fluttering in the chest, rapid heartbeat Severity: Mild to mod Exacerbating/Alleviating Factors: None Associated Symptoms: None Pertinent Negatives: No chest pain, no shortness of breath, no recent fever or illness  Additional History: Has not had an A. fib episode 4 years.  Does not take blood thinners.  Review of Systems  A complete 10 system review of systems was obtained and all systems are negative except as noted in the HPI and PMH.   Patient's Health History    Past Medical History:  Diagnosis Date   Body mass index 40.0-44.9, adult (HCC)    Chest pain, unspecified    Dysrhythmia    AFib   GERD (gastroesophageal reflux disease)    Morbid obesity (HCC)    Paroxysmal atrial fibrillation (HCC)    Polycystic disease, ovaries    Sickle cell trait (HCC)    Tobacco use disorder     Past Surgical History:  Procedure Laterality Date   BIOPSY  08/07/2018   Procedure: BIOPSY;  Surgeon: West Bali, MD;  [facility];  Service: Endoscopy;;  gastric bx's   DILATION AND EVACUATION  07/07/2020   Procedure: DILATATION AND EVACUATION;  Surgeon: Maxie Better, MD;  Location: [facility] OR;  Service: Gynecology;;   ESOPHAGOGASTRODUODENOSCOPY N/A 08/07/2018   Procedure: ESOPHAGOGASTRODUODENOSCOPY (EGD);  Surgeon: West Bali, MD;  Location: [facility];  Service: Endoscopy;  Laterality: N/A;  3:00pm   HYDRADENITIS EXCISION Right 05/29/2018   Procedure: EXCISION HIDRADENITIS AXILLA;  Surgeon: Lucretia Roers, MD;  Location: [facility] ORS;  Service: General;   Laterality: Right;   MALONEY DILATION N/A 08/07/2018   Procedure: Alvy Beal;  Surgeon: West Bali, MD;  Location: [facility];  Service: Endoscopy;  Laterality: N/A;   none     SAVORY DILATION N/A 08/07/2018   Procedure: SAVORY DILATION;  Surgeon: West Bali, MD;  Location: [facility];  Service: Endoscopy;  Laterality: N/A;   WISDOM TOOTH EXTRACTION      Family History  Problem Relation Age of Onset   Arrhythmia Mother 65       History of Cardiac Stents   Hypertension Mother    Diabetes Mother    COPD Mother    Colon polyps Mother    Hypertension Father    Diabetes Other        Family History   Hypertension Other        Family History   Diabetes Sister    Hypertension Sister    Colon cancer Maternal Uncle    Crohn's disease Cousin     Social History   Socioeconomic History   Marital status: Married    Spouse name: CHRISTOPHER    Number of children: Not on file   Years of education: Not on file   Highest education level: Not on file  Occupational History   Occupation: PHLEBOTOMIST    Employer: [employer]  Tobacco Use   Smoking status: Former    Packs/day: 1.00    Years: 12.00    Pack years: 12.00    Types: Cigarettes  Start date: 01/21/2004    Quit date: 09/10/2011    Years since quitting: 9.6   Smokeless tobacco: Never  Vaping Use   Vaping Use: Never used  Substance and Sexual Activity   Alcohol use: Never    Alcohol/week: 0.0 standard drinks   Drug use: No   Sexual activity: Yes  Other Topics Concern   Not on file  Social History Narrative   Lives in [place], [place], [place] with husband.  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   Social Determinants of Health   Financial Resource Strain: Not on file  Food Insecurity: Not on file  Transportation Needs: Not on file  Physical Activity: Not on file  Stress: Not on file  Social Connections: Not on file  Intimate Partner Violence: Not on file      Physical Exam   Vitals:   04/20/21 0630 04/20/21 0645  BP: 117/68 123/60  Pulse: 83 97  Resp: 11   Temp:    SpO2: 94% 97%    CONSTITUTIONAL: Well-appearing, NAD NEURO:  Alert and oriented x 3, no focal deficits EYES:  eyes equal and reactive ENT/NECK:  no LAD, no JVD CARDIO: Tachycardic and irregular rate, well-perfused, normal S1 and S2 PULM:  CTAB no wheezing or rhonchi GI/GU:  normal bowel sounds, non-distended, non-tender MSK/SPINE:  No gross deformities, no edema SKIN:  no rash, atraumatic PSYCH:  Appropriate speech and behavior  *Additional and/or pertinent findings included in MDM below  Diagnostic and Interventional Summary    EKG Interpretation  Date/Time:  Friday April 20 2021 03:37:32 EDT Ventricular Rate:  101 PR Interval:    QRS Duration: 82 QT Interval:  314 QTC Calculation: 407 R Axis:   49 Text Interpretation: Atrial fibrillation Low voltage, precordial leads Probable anteroseptal infarct, old Borderline repolarization abnormality Confirmed by Kennis Carina [phone]) on 04/20/2021 3:52:24 AM       Labs Reviewed  CBC - Abnormal; Notable for the following components:      Result Value   RBC 5.88 (*)    MCV 74.0 (*)    MCH 24.8 (*)    RDW 18.2 (*)    All other components within normal limits  COMPREHENSIVE METABOLIC PANEL - Abnormal; Notable for the following components:   Glucose, Bld 106 (*)    All other components within normal limits  RESP PANEL BY RT-PCR (FLU A&B, COVID) ARPGX2  MAGNESIUM    DG Chest Port 1 View  Final Result      Medications  etomidate (AMIDATE) injection 10 mg (has no administration in time range)  sodium chloride 0.9 % bolus 1,000 mL (0 mLs Intravenous Stopped 04/20/21 0616)  diltiazem (CARDIZEM) injection 20 mg (20 mg Intravenous Given 04/20/21 0409)     Procedures  /  Critical Care .Critical Care Performed by: Sabas Sous, MD Authorized by: Sabas Sous, MD   Critical care provider statement:     Critical care time (minutes):  37   Critical care was necessary to treat or prevent imminent or life-threatening deterioration of the following conditions: A. fib with RVR.   Critical care was time spent personally by me on the following activities:  Discussions with consultants, evaluation of patient's response to treatment, examination of patient, ordering and performing treatments and interventions, ordering and review of laboratory studies, ordering and review of radiographic studies, pulse oximetry, re-evaluation of patient's condition, obtaining history from patient or surrogate and review of old charts  ED  Course and Medical Decision Making  I have reviewed the triage vital signs, the nursing notes, and pertinent available records from the EMR.  Listed above are laboratory and imaging tests that I personally ordered, reviewed, and interpreted and then considered in my medical decision making (see below for details).  A. fib with RVR.  No obvious triggers.  No chest pain, no evidence of DVT, history of the same.  Not anticoagulated but sudden onset within the past couple hours, good candidate for cardioversion.  However patient is very hesitant to undergo this procedure, would prefer fluids and trial of medical management.  Providing diltiazem and will reassess.     Patient's rates are well controlled and stable in the 80s to 90s.  She has no significant symptoms, still feels the irregular beat.  She does not consent to cardioversion at this time.  Seems like a good candidate for discharge and starting on a rate control medicine such as diltiazem.  Will discuss dosing and follow-up with cardiology.  This patients CHA2DS2-VASc Score and unadjusted Ischemic Stroke Rate (% per year) is equal to 0.6 % stroke rate/year from a score of 1  Above score calculated as 1 point each if present [CHF, HTN, DM, Vascular=MI/PAD/Aortic Plaque, Age if 65-74, or Female] Above score calculated as 2 points each if  present [Age > 75, or Stroke/TIA/TE]   Elmer Sow. Pilar Plate, MD [facility] Emergency Medicine [facility] [email or web address]  Final Clinical Impressions(s) / ED Diagnoses     ICD-10-CM   1. Atrial fibrillation with RVR (HCC)  I48.91       ED Discharge Orders     None        Discharge Instructions Discussed with and Provided to Patient:   Discharge Instructions   None       Sabas Sous, MD 04/20/21 [id number]

## 2021-04-20 NOTE — ED Notes (Signed)
Went in to give pt discharge papers; pt stating she is very upset due to EDP not coming back in room to explain; this RN informed pt start taking the medication EDP prescribed and to call the cardiologist today for an appointment; this RN apologized for the situation and gave ED director number to call in regards what happened

## 2021-04-20 NOTE — ED Triage Notes (Signed)
Pt states that she feels like she is in a-fib. Pt has not had a flare up in years. Pt states she in lightheaded and is fatigued. Pt states that he woke her up out of her sleep. Pt started herbal tea Monday.

## 2021-04-20 NOTE — Discharge Instructions (Addendum)
You were evaluated in the Emergency Department and after careful evaluation, we did not find any emergent condition requiring admission or further testing in the hospital.  Your exam/testing today is overall reassuring.  Start taking the new diltiazem medicine daily and call the cardiology office provided for follow-up.  Please return to the Emergency Department if you experience any worsening of your condition.   Thank you for allowing [place] to be a part of your care.

## 2021-04-23 ENCOUNTER — Ambulatory Visit: Payer: BC Managed Care – PPO | Admitting: Internal Medicine

## 2021-04-24 ENCOUNTER — Encounter: Payer: Self-pay | Admitting: Cardiovascular Disease

## 2021-04-24 ENCOUNTER — Other Ambulatory Visit: Payer: Self-pay

## 2021-04-24 ENCOUNTER — Encounter: Payer: Self-pay | Admitting: *Deleted

## 2021-04-24 ENCOUNTER — Ambulatory Visit: Payer: BC Managed Care – PPO | Admitting: Cardiovascular Disease

## 2021-04-24 VITALS — BP 118/80 | HR 71 | Ht 67.0 in | Wt 309.8 lb

## 2021-04-24 DIAGNOSIS — R0683 Snoring: Secondary | ICD-10-CM

## 2021-04-24 DIAGNOSIS — I4891 Unspecified atrial fibrillation: Secondary | ICD-10-CM

## 2021-04-24 DIAGNOSIS — I480 Paroxysmal atrial fibrillation: Secondary | ICD-10-CM | POA: Diagnosis not present

## 2021-04-24 LAB — TSH: TSH: 1.97 u[IU]/mL (ref 0.450–4.500)

## 2021-04-24 MED ORDER — PROPRANOLOL HCL 10 MG PO TABS: 10.0000 mg | ORAL_TABLET | Freq: Four times a day (QID) | ORAL | 5 refills | Status: DC | PRN

## 2021-04-24 NOTE — Progress Notes (Signed)
Cardiology Office Note:    Date:  04/24/2021   ID:  Deborah Riddle, DOB [birth year], MRN [id number]  PCP:  Ignatius Specking, MD   [facility] Providers Cardiologist:  Launi Asencio    Referring MD: Ignatius Specking, MD   Chief Complaint  Patient presents with   Atrial Fibrillation     Oct. 11, 2022   Deborah Riddle is a 35 y.o. female with a hx of paroxysmal atrial fibrillation, morbid obesity   She has seen Dr. Judd Gaudier sworn, Dr. Kirke Corin , and Dr. Johney Frame in the past.  She has been tried on flecainide but did not tolerate it due to wheezing.  She had a sleep study done at [facility].  We do not have the results for that.  She has been referred to the A. fib clinic as well as the bariatric surgical clinic at [facility].  She has not been to either .  Wt was 293 lbs as of June 2016.   She was seen by Joni Reining, NP for follow-up in December, 2017.  She is starting verapamil and was encouraged to retry flecainide 50 mg twice a day.   She has not had any atrial fib for years. She has been under lots of stress with the passing of her mother last November.  Has started drinknig herbal tea Husband says she snores at night  - not sure she has apneic episodes.  On Oct. 7, she work up with palpitations and was found to have atrial fib .   She received IV Cardizem, IV fluids.  Decided not to have a cardioversion Went back to bed , work up with normal sinus rhythm She has not been on Flecainide.     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     Past Medical History:  Diagnosis Date   Body mass index 40.0-44.9, adult (HCC)    Chest pain, unspecified    Dysrhythmia    AFib   GERD (gastroesophageal reflux disease)    Morbid obesity (HCC)    Paroxysmal atrial fibrillation (HCC)    Polycystic disease, ovaries    Sickle cell trait (HCC)    Tobacco use disorder      Past Surgical History:  Procedure Laterality Date   BIOPSY  08/07/2018   Procedure: BIOPSY;  Surgeon: West Bali, MD;  Location: [facility];  Service: Endoscopy;;  gastric bx's   DILATION AND EVACUATION  07/07/2020   Procedure: DILATATION AND EVACUATION;  Surgeon: Maxie Better, MD;  Location: [facility] OR;  Service: Gynecology;;   ESOPHAGOGASTRODUODENOSCOPY N/A 08/07/2018   Procedure: ESOPHAGOGASTRODUODENOSCOPY (EGD);  Surgeon: West Bali, MD;  Location: [facility];  Service: Endoscopy;  Laterality: N/A;  3:00pm   HYDRADENITIS EXCISION Right 05/29/2018   Procedure: EXCISION HIDRADENITIS AXILLA;  Surgeon: Lucretia Roers, MD;  Location: [facility] ORS;  Service: General;  Laterality: Right;   MALONEY DILATION N/A 08/07/2018   Procedure: Alvy Beal;  Surgeon: West Bali, MD;  Location: [facility];  Service: Endoscopy;  Laterality: N/A;   none     SAVORY DILATION N/A 08/07/2018   Procedure: SAVORY DILATION;  Surgeon: West Bali, MD;  Location: [facility];  Service: Endoscopy;  Laterality: N/A;   WISDOM TOOTH EXTRACTION      Current Medications: Current Meds  Medication Sig   diltiazem (  TIAZAC) 120 MG 24 hr capsule Take 1 capsule (120 mg total) by mouth daily.   ibuprofen (ADVIL) 800 MG tablet Take 800 mg by mouth every 8 (eight) hours as needed.   loratadine (CLARITIN) 10 MG tablet Take 10 mg by mouth daily.   metFORMIN (GLUCOPHAGE) 500 MG tablet Take 500 mg by mouth daily with breakfast.   propranolol (INDERAL) 10 MG tablet Take 1 tablet (10 mg total) by mouth 4 (four) times daily as needed.   Vitamin D, Ergocalciferol, (DRISDOL) 50000 UNITS CAPS capsule Take 50,000 Units by mouth every Saturday.      Allergies:   Dificid [fidaxomicin], Amoxicillin, Clindamycin/lincomycin, and Heparin   Social History   Socioeconomic History   Marital status: Married    Spouse name: CHRISTOPHER    Number of children: Not on file   Years of education: Not on file    Highest education level: Not on file  Occupational History   Occupation: PHLEBOTOMIST    Employer: [employer]  Tobacco Use   Smoking status: Former    Packs/day: 1.00    Years: 12.00    Pack years: 12.00    Types: Cigarettes    Start date: 01/21/2004    Quit date: 09/10/2011    Years since quitting: 9.6   Smokeless tobacco: Never  Vaping Use   Vaping Use: Never used  Substance and Sexual Activity   Alcohol use: Never    Alcohol/week: 0.0 standard drinks   Drug use: No   Sexual activity: Yes  Other Topics Concern   Not on file  Social History Narrative   Lives in [place], [place], [place] with husband.  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   Social Determinants of Health   Financial Resource Strain: Not on file  Food Insecurity: Not on file  Transportation Needs: Not on file  Physical Activity: Not on file  Stress: Not on file  Social Connections: Not on file     Family History: The patient's family history includes Arrhythmia (age of onset: 74) in her mother; COPD in her mother; Colon cancer in her maternal uncle; Colon polyps in her mother; Crohn's disease in her cousin; Diabetes in her mother, sister, and another family member; Hypertension in her father, mother, sister, and another family member.  ROS:   Please see the history of present illness.     All other systems reviewed and are negative.  EKGs/Labs/Other Studies Reviewed:    The following studies were reviewed today:   EKG:  EKG  Oct. 11, 2022,  no ST or T abn.  Recent Labs: 04/20/2021: ALT 17; BUN 12; Creatinine, Ser 0.91; Hemoglobin 14.6; Magnesium 2.0; Platelets 227; Potassium 3.8; Sodium 141 04/24/2021: TSH 1.970  Recent Lipid Panel No results found for: CHOL, TRIG, HDL, CHOLHDL, VLDL, LDLCALC, LDLDIRECT   Risk Assessment/Calculations:           Physical Exam:    VS:  BP 118/80   Pulse 71   Ht 5\' 7"  (1.702 m)   Wt (!) 309 lb 12.8 oz (140.5 kg)    SpO2 98%   BMI 48.52 kg/m     Wt Readings from Last 3 Encounters:  04/24/21 (!) 309 lb 12.8 oz (140.5 kg)  04/20/21 (!) 310 lb (140.6 kg)  02/04/21 (!) 309 lb (140.2 kg)     GEN: morbidly obese female,  well developed in no acute distress HEENT: Normal NECK: No JVD; No carotid bruits LYMPHATICS: No lymphadenopathy  CARDIAC: NSR  RESPIRATORY:  Clear to auscultation without rales, wheezing or rhonchi  ABDOMEN: Soft, non-tender, non-distended MUSCULOSKELETAL:  No edema; No deformity  SKIN: Warm and dry NEUROLOGIC:  Alert and oriented x 3 PSYCHIATRIC:  Normal affect   ASSESSMENT:    1. Atrial fibrillation, unspecified type (HCC)   2. Snoring   3. Morbid obesity (HCC)    PLAN:       PAF :  CHADS2VASC is 1 ( female )  No indication for OAC at this point  Op advised a weight loss program.  We will get an echocardiogram Check TSH.  Her husband think she might have sleep apnea.  We will order a sleep study.  Given her propranolol 10 mg tablets to take on an as-needed basis if she wakes up with atrial fibrillation.  Perhaps this will save her a trip to the emergency room.  Strongly advised weight loss.  She needs to avoid carbohydrates.  I specifically advised her to avoid foods that are white, weak, sweet.  We will have her follow-up with an APP in 3 months.        Medication Adjustments/Labs and Tests Ordered: Current medicines are reviewed at length with the patient today.  Concerns regarding medicines are outlined above.  Orders Placed This Encounter  Procedures   TSH   Amb Ref to Medical Weight Management   EKG 12-Lead   ECHOCARDIOGRAM COMPLETE   Split night study   Meds ordered this encounter  Medications   propranolol (INDERAL) 10 MG tablet    Sig: Take 1 tablet (10 mg total) by mouth 4 (four) times daily as needed.    Dispense:  120 tablet    Refill:  5    Patient Instructions  Medication Instructions:  1) START Propranolol 10mg  four times daily as  needed   *If you need a refill on your cardiac medications before your next appointment, please call your pharmacy*   Lab Work: TSH today  If you have labs (blood work) drawn today and your tests are completely normal, you will receive your results only by: MyChart Message (if you have MyChart) OR A paper copy in the mail If you have any lab test that is abnormal or we need to change your treatment, we will call you to review the results.   Testing/Procedures: Your physician has requested that you have an echocardiogram. Echocardiography is a painless test that uses sound waves to create images of your heart. It provides your doctor with information about the size and shape of your heart and how well your heart's chambers and valves are working. This procedure takes approximately one hour. There are no restrictions for this procedure.  Your physician has recommended that you have a sleep study. This test records several body functions during sleep, including: brain activity, eye movement, oxygen and carbon dioxide blood levels, heart rate and rhythm, breathing rate and rhythm, the flow of air through your mouth and nose, snoring, body muscle movements, and chest and belly movement.   Follow-Up: At [facility], you and your health needs are our priority.  As part of our continuing mission to provide you with exceptional heart care, we have created designated Provider Care Teams.  These Care Teams include your primary Cardiologist (physician) and Advanced Practice Providers (APPs -  Physician Assistants and Nurse Practitioners) who all work together to provide you with the care you need, when you need it.  We recommend signing up for the patient portal called "MyChart".  Sign up information is provided on this After Visit Summary.  MyChart is used to connect with patients for Virtual Visits (Telemedicine).  Patients are able to view lab/test results, encounter notes, upcoming appointments,  etc.  Non-urgent messages can be sent to your provider as well.   To learn more about what you can do with MyChart, go to [email or web address].    Your next appointment:   6 month(s)  The format for your next appointment:   In Person  Provider:   You will see one of the following Advanced Practice Providers on your designated Care Team:   Tereso Newcomer, PA-C [place], [place]   Other Instructions  You have been referred to [facility] Medical Weight Management team. That office will contact you to get you scheduled.      Signed, Kristeen Miss, MD  04/24/2021 5:07 PM    [facility]

## 2021-04-24 NOTE — Patient Instructions (Signed)
Medication Instructions:  1) START Propranolol 10mg  four times daily as needed   *If you need a refill on your cardiac medications before your next appointment, please call your pharmacy*   Lab Work: TSH today  If you have labs (blood work) drawn today and your tests are completely normal, you will receive your results only by: MyChart Message (if you have MyChart) OR A paper copy in the mail If you have any lab test that is abnormal or we need to change your treatment, we will call you to review the results.   Testing/Procedures: Your physician has requested that you have an echocardiogram. Echocardiography is a painless test that uses sound waves to create images of your heart. It provides your doctor with information about the size and shape of your heart and how well your heart's chambers and valves are working. This procedure takes approximately one hour. There are no restrictions for this procedure.  Your physician has recommended that you have a sleep study. This test records several body functions during sleep, including: brain activity, eye movement, oxygen and carbon dioxide blood levels, heart rate and rhythm, breathing rate and rhythm, the flow of air through your mouth and nose, snoring, body muscle movements, and chest and belly movement.   Follow-Up: At [facility], you and your health needs are our priority.  As part of our continuing mission to provide you with exceptional heart care, we have created designated Provider Care Teams.  These Care Teams include your primary Cardiologist (physician) and Advanced Practice Providers (APPs -  Physician Assistants and Nurse Practitioners) who all work together to provide you with the care you need, when you need it.  We recommend signing up for the patient portal called "MyChart".  Sign up information is provided on this After Visit Summary.  MyChart is used to connect with patients for Virtual Visits (Telemedicine).  Patients are  able to view lab/test results, encounter notes, upcoming appointments, etc.  Non-urgent messages can be sent to your provider as well.   To learn more about what you can do with MyChart, go to [facility].    Your next appointment:   6 month(s)  The format for your next appointment:   In Person  Provider:   You will see one of the following Advanced Practice Providers on your designated Care Team:   [email or web address], PA-C Tereso Newcomer, [place]   Other Instructions  You have been referred to [facility] Medical Weight Management team. That office will contact you to get you scheduled.

## 2021-04-30 ENCOUNTER — Telehealth: Payer: Self-pay | Admitting: *Deleted

## 2021-04-30 NOTE — Telephone Encounter (Signed)
Prior Authorization for split Night sent to [facility] via Fax @888 [phone].

## 2021-04-30 NOTE — Telephone Encounter (Signed)
-----   Message from Julio Sicks, RN sent at 04/24/2021 11:58 AM EDT ----- Sleep study ordered

## 2021-05-04 DIAGNOSIS — J329 Chronic sinusitis, unspecified: Secondary | ICD-10-CM | POA: Diagnosis not present

## 2021-05-04 DIAGNOSIS — J309 Allergic rhinitis, unspecified: Secondary | ICD-10-CM | POA: Diagnosis not present

## 2021-05-04 DIAGNOSIS — Z6841 Body Mass Index (BMI) 40.0 and over, adult: Secondary | ICD-10-CM | POA: Diagnosis not present

## 2021-05-04 DIAGNOSIS — R059 Cough, unspecified: Secondary | ICD-10-CM | POA: Diagnosis not present

## 2021-05-04 DIAGNOSIS — Z299 Encounter for prophylactic measures, unspecified: Secondary | ICD-10-CM | POA: Diagnosis not present

## 2021-05-08 DIAGNOSIS — Z79899 Other long term (current) drug therapy: Secondary | ICD-10-CM | POA: Diagnosis not present

## 2021-05-08 DIAGNOSIS — E559 Vitamin D deficiency, unspecified: Secondary | ICD-10-CM | POA: Diagnosis not present

## 2021-05-08 DIAGNOSIS — D509 Iron deficiency anemia, unspecified: Secondary | ICD-10-CM | POA: Diagnosis not present

## 2021-05-08 DIAGNOSIS — F418 Other specified anxiety disorders: Secondary | ICD-10-CM | POA: Diagnosis not present

## 2021-05-08 DIAGNOSIS — Z0000 Encounter for general adult medical examination without abnormal findings: Secondary | ICD-10-CM | POA: Diagnosis not present

## 2021-05-11 ENCOUNTER — Ambulatory Visit (HOSPITAL_COMMUNITY): Payer: BC Managed Care – PPO | Attending: Cardiology

## 2021-05-11 ENCOUNTER — Encounter: Payer: Self-pay | Admitting: Cardiology

## 2021-05-11 ENCOUNTER — Other Ambulatory Visit: Payer: Self-pay

## 2021-05-11 DIAGNOSIS — I4891 Unspecified atrial fibrillation: Secondary | ICD-10-CM | POA: Insufficient documentation

## 2021-05-11 LAB — ECHOCARDIOGRAM COMPLETE
Area-P 1/2: 2.8 cm2
S' Lateral: 3.1 cm

## 2021-06-05 DIAGNOSIS — E282 Polycystic ovarian syndrome: Secondary | ICD-10-CM | POA: Diagnosis not present

## 2021-06-05 DIAGNOSIS — E288 Other ovarian dysfunction: Secondary | ICD-10-CM | POA: Diagnosis not present

## 2021-06-05 DIAGNOSIS — N912 Amenorrhea, unspecified: Secondary | ICD-10-CM | POA: Diagnosis not present

## 2021-06-05 DIAGNOSIS — N96 Recurrent pregnancy loss: Secondary | ICD-10-CM | POA: Diagnosis not present

## 2021-06-05 DIAGNOSIS — Z319 Encounter for procreative management, unspecified: Secondary | ICD-10-CM | POA: Diagnosis not present

## 2021-06-05 DIAGNOSIS — Z31430 Encounter of female for testing for genetic disease carrier status for procreative management: Secondary | ICD-10-CM | POA: Diagnosis not present

## 2021-06-26 ENCOUNTER — Telehealth: Payer: Self-pay

## 2021-06-26 NOTE — Telephone Encounter (Signed)
Letter has been sent to patient instructing them to call us if they are still interested in completing their sleep study. If we have not received a response from the patient within 30 days of this notice, the order will be cancelled and they will need to discuss the need for a sleep study at their next office visit.  ° °

## 2021-07-03 DIAGNOSIS — Z789 Other specified health status: Secondary | ICD-10-CM | POA: Diagnosis not present

## 2021-07-03 DIAGNOSIS — Z0000 Encounter for general adult medical examination without abnormal findings: Secondary | ICD-10-CM | POA: Diagnosis not present

## 2021-07-03 DIAGNOSIS — Z299 Encounter for prophylactic measures, unspecified: Secondary | ICD-10-CM | POA: Diagnosis not present

## 2021-07-03 DIAGNOSIS — Z6841 Body Mass Index (BMI) 40.0 and over, adult: Secondary | ICD-10-CM | POA: Diagnosis not present

## 2021-07-03 DIAGNOSIS — E559 Vitamin D deficiency, unspecified: Secondary | ICD-10-CM | POA: Diagnosis not present

## 2021-07-03 DIAGNOSIS — R5383 Other fatigue: Secondary | ICD-10-CM | POA: Diagnosis not present

## 2021-07-03 DIAGNOSIS — D509 Iron deficiency anemia, unspecified: Secondary | ICD-10-CM | POA: Diagnosis not present

## 2021-07-03 DIAGNOSIS — Z1331 Encounter for screening for depression: Secondary | ICD-10-CM | POA: Diagnosis not present

## 2021-07-03 DIAGNOSIS — Z79899 Other long term (current) drug therapy: Secondary | ICD-10-CM | POA: Diagnosis not present

## 2021-07-15 ENCOUNTER — Emergency Department (HOSPITAL_COMMUNITY)
Admission: EM | Admit: 2021-07-15 | Discharge: 2021-07-16 | Disposition: A | Discharge status: Home / Self Care | Payer: BC Managed Care – PPO | Attending: Emergency Medicine | Admitting: Emergency Medicine

## 2021-07-15 ENCOUNTER — Other Ambulatory Visit: Payer: Self-pay

## 2021-07-15 DIAGNOSIS — M79602 Pain in left arm: Secondary | ICD-10-CM | POA: Diagnosis not present

## 2021-07-15 DIAGNOSIS — M5412 Radiculopathy, cervical region: Secondary | ICD-10-CM | POA: Diagnosis not present

## 2021-07-15 DIAGNOSIS — R9431 Abnormal electrocardiogram [ECG] [EKG]: Secondary | ICD-10-CM | POA: Diagnosis not present

## 2021-07-15 NOTE — ED Triage Notes (Addendum)
Pt arrives with c/o arm pain that started around 8pm. Per pt, pain starts in her shoulder and radiates down into her arm. Pt took tylenol around 9:30pm. Pt denies, CP,SOB, or viral illnesses. Pt does have hx of a.fib.

## 2021-07-15 NOTE — ED Provider Notes (Signed)
[facility] Provider Note   CSN: [id number] Arrival date & time: 07/15/21  2201     History  Chief Complaint  Patient presents with   Arm Pain    Deborah Riddle is a 36 y.o. female.  36 year old female presents emerged from today secondary to left arm pain.  Patient states that she started her shoulder radiates down the side of her arm and on the ulnar side of her arm.  She did nothing to injure it.  She has not been sick recently.  She states that she just got out of bed like this but she was not sleeping and she did not stretch it and any abnormal way.  She has a history of atrial fibrillation and is concerned this could be her heart.  She also was concerned that it could possibly be a stroke and so she came here for further evaluation.  She notes that her blood pressure was elevated after she noted this pain and she also notes that it is improved now.  Review of the records it appears that she has had a similar episode in the past back in July.  She is treated with steroids.  She also states that she had an episode similar to this in her left leg sometime in the past as well.  No family history of multiple sclerosis.   Arm Pain      Home Medications Prior to Admission medications   Medication Sig Start Date End Date Taking? Authorizing Provider  predniSONE (DELTASONE) 20 MG tablet 3 tabs po daily x 3 days, then 2 tabs x 3 days, then 1.5 tabs x 3 days, then 1 tab x 3 days, then 0.5 tabs x 3 days 07/16/21  Yes Yamir Carignan, Corene Cornea, MD  diltiazem (TIAZAC) 120 MG 24 hr capsule Take 1 capsule (120 mg total) by mouth daily. 04/20/21   Maudie Flakes, MD  ibuprofen (ADVIL) 800 MG tablet Take 800 mg by mouth every 8 (eight) hours as needed.    [provider]  loratadine (CLARITIN) 10 MG tablet Take 10 mg by mouth daily.    [provider]  metFORMIN (GLUCOPHAGE) 500 MG tablet Take 500 mg by mouth daily with breakfast. 10/07/19   [provider]   propranolol (INDERAL) 10 MG tablet Take 1 tablet (10 mg total) by mouth 4 (four) times daily as needed. 04/24/21   Nahser, Wonda Cheng, MD  Vitamin D, Ergocalciferol, (DRISDOL) 50000 UNITS CAPS capsule Take 50,000 Units by mouth every Saturday.  04/22/14   [provider]      Allergies    Dificid [fidaxomicin], Amoxicillin, Clindamycin/lincomycin, and Heparin    Review of Systems   Review of Systems  Physical Exam Updated Vital Signs BP (!) 112/54    Pulse 68    Temp 98.6 F (37 C) (Oral)    Resp 16    Wt 135.6 kg    SpO2 99%    BMI 46.83 kg/m  Physical Exam Vitals and nursing note reviewed.  Constitutional:      Appearance: She is well-developed.  HENT:     Head: Normocephalic and atraumatic.     Mouth/Throat:     Mouth: Mucous membranes are moist.     Pharynx: Oropharynx is clear.  Eyes:     Pupils: Pupils are equal, round, and reactive to light.  Cardiovascular:     Rate and Rhythm: Normal rate and regular rhythm.  Pulmonary:     Effort: Pulmonary effort is  normal. No respiratory distress.     Breath sounds: No stridor.  Abdominal:     General: Abdomen is flat. There is no distension.  Musculoskeletal:     Cervical back: Normal range of motion.  Skin:    General: Skin is warm and dry.     Findings: No rash (No evident rash in her left arm that explain her symptoms.).  Neurological:     Mental Status: She is alert.     Comments: She has full strength in her left shoulder, elbow and wrist.  She can make a thumbs up, high-five sign, touch her pinky to her thumb and make an okay sign with her left hand.  Sensation intact in her left hand and forearm.    ED Results / Procedures / Treatments   Labs (all labs ordered are listed, but only abnormal results are displayed) Labs Reviewed - No data to display  EKG EKG Interpretation  Date/Time:  "Sunday July 15 2021 22:19:20 EST Ventricular Rate:  73 PR Interval:  178 QRS Duration: 84 QT Interval:  382 QTC  Calculation: 420 R Axis:   81 Text Interpretation: Normal sinus rhythm Nonspecific T wave abnormality Abnormal ECG When compared with ECG of 20-Apr-2021 03:37, PREVIOUS ECG IS PRESENT Confirmed by Jerry Haugen ([id number]) on 07/15/2021 11:55:08 PM  Radiology No results found.  Procedures .1-3 Lead EKG Interpretation Performed by: Catrina Fellenz, MD Authorized by: Katilin Raynes, MD     Interpretation: normal     ECG rate:  72   ECG rate assessment: normal     Rhythm: sinus rhythm     Ectopy: none     Conduction: normal      Medications Ordered in ED Medications - No data to display  ED Course/ Medical Decision Making/ A&P Clinical Course as of 07/16/21 0041  Sun Jul 15, 2021  2355 Temp: 98.6 F (37 C) [JM]  2355 BP(!): 159/92 Slightly high but unlikely causing her arm pain. May be high because her arm hurts though. [JM]  2355 SpO2: 100 % [JM]  2355 Resp: 20 [JM]  2355 EKG 12-Lead Ecg reassuring. No e/o ACS, Afib or other new abnormalities.  [JM]    Clinical Course User Index [JM] Sahian Kerney, MD                           Medical Decision Making  Patient's symptoms seem consistent with radiculopathy.  She has a primary doctor who can follow that up.  Also consider multiple sclerosis with her history of a similar episode in her left leg that self resolved.  She is also had an episode in this arm before.  She is a bit young to be having arthritis or osteophytes but MRI of her cervical spine along with MRI with and without contrast of brain and spinal cord might be necessary to rule out multiple sclerosis.  No trauma or other injuries to indicate need for imaging tonight.  She is already had CT scan with similar symptoms.  Offered prednisone for symptoms she is unsure if she wants this so prescription will be written for her to decide.    Final Clinical Impression(s) / ED Diagnoses Final diagnoses:  Cervical radiculopathy  Left arm pain    Rx / DC Orders ED Discharge Orders           Ordered    predniSONE (DELTASONE) 20 MG tablet        01" /02/23 0037  Asaf Elmquist, Corene Cornea, MD 07/16/21 [phone]

## 2021-07-16 MED ORDER — PREDNISONE 20 MG PO TABS: ORAL_TABLET | ORAL | 0 refills | Status: DC

## 2021-07-19 DIAGNOSIS — R059 Cough, unspecified: Secondary | ICD-10-CM | POA: Diagnosis not present

## 2021-07-19 DIAGNOSIS — R5383 Other fatigue: Secondary | ICD-10-CM | POA: Diagnosis not present

## 2021-07-19 DIAGNOSIS — Z6841 Body Mass Index (BMI) 40.0 and over, adult: Secondary | ICD-10-CM | POA: Diagnosis not present

## 2021-07-19 DIAGNOSIS — R42 Dizziness and giddiness: Secondary | ICD-10-CM | POA: Diagnosis not present

## 2021-07-27 ENCOUNTER — Other Ambulatory Visit: Payer: Self-pay

## 2021-07-27 ENCOUNTER — Ambulatory Visit (INDEPENDENT_AMBULATORY_CARE_PROVIDER_SITE_OTHER): Payer: BC Managed Care – PPO | Admitting: Cardiovascular Disease

## 2021-07-27 ENCOUNTER — Encounter: Payer: Self-pay | Admitting: Cardiovascular Disease

## 2021-07-27 VITALS — BP 126/80 | HR 68 | Ht 68.0 in | Wt 301.0 lb

## 2021-07-27 DIAGNOSIS — I4891 Unspecified atrial fibrillation: Secondary | ICD-10-CM

## 2021-07-27 MED ORDER — PROPRANOLOL HCL 10 MG PO TABS: 10.0000 mg | ORAL_TABLET | Freq: Four times a day (QID) | ORAL | 5 refills | Status: DC | PRN

## 2021-07-27 NOTE — Progress Notes (Signed)
Cardiology Office Note:    Date:  07/27/2021   ID:  Deborah Riddle, DOB 1986-02-01, MRN [facility]:[id number]  PCP:  Glenda Chroman, MD   [facility] Providers Cardiologist:  Jeanett Antonopoulos    Referring MD: Glenda Chroman, MD   Chief Complaint  Patient presents with   Atrial Fibrillation     Oct. 11, 2022   Deborah Riddle is a 36 y.o. female with a hx of paroxysmal atrial fibrillation, morbid obesity   She has seen Dr. Zenda Alpers sworn, Dr. Fletcher Anon , and Dr. Rayann Heman in the past.  She has been tried on flecainide but did not tolerate it due to wheezing.  She had a sleep study done at [facility].  We do not have the results for that.  She has been referred to the A. fib clinic as well as the bariatric surgical clinic at [facility].  She has not been to either .  Wt was 293 lbs as of June 2016.   She was seen by Jory Sims, NP for follow-up in December, 2017.  She is starting verapamil and was encouraged to retry flecainide 50 mg twice a day.   She has not had any atrial fib for years. She has been under lots of stress with the passing of her mother last November.  Has started drinknig herbal tea Husband says she snores at night  - not sure she has apneic episodes.  On Oct. 7, she work up with palpitations and was found to have atrial fib .   She received IV Cardizem, IV fluids.  Decided not to have a cardioversion Went back to bed , work up with normal sinus rhythm She has not been on Flecainide.     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  July 27, 2021: Patient is seen today for follow-up of her paroxysmal atrial fibrillation and morbid obesity   wt is 301 lbs . ( Down 9 lbs )  Tried to schedule her sleep study but could not , the referal has expired  Has had palpitations ( increased HR into the 90s)when she is waking up or just going to bed  Palpitations last  night lasted many hours .  Is walking 30 minutes, 4 times a week    Past Medical History:  Diagnosis Date   Body mass index 40.0-44.9, adult (HCC)    Chest pain, unspecified    Dysrhythmia    AFib   GERD (gastroesophageal reflux disease)    Morbid obesity (HCC)    Paroxysmal atrial fibrillation (HCC)    Polycystic disease, ovaries    Sickle cell trait ([place])    Tobacco use disorder     Past Surgical History:  Procedure Laterality Date   BIOPSY  08/07/2018   Procedure: BIOPSY;  Surgeon: Danie Binder, MD;  [facility];  Service: Endoscopy;;  gastric bx's   DILATION AND EVACUATION  07/07/2020   Procedure: DILATATION AND EVACUATION;  Surgeon: Servando Salina, MD;  Location: [place];  Service: Gynecology;;   ESOPHAGOGASTRODUODENOSCOPY N/A 08/07/2018   Procedure: ESOPHAGOGASTRODUODENOSCOPY (EGD);  Surgeon: Danie Binder, MD;  [facility];  Service: Endoscopy;  Laterality: N/A;  3:00pm   HYDRADENITIS EXCISION Right 05/29/2018   Procedure: EXCISION HIDRADENITIS AXILLA;  Surgeon: Virl Cagey, MD;  Location: [facility];  Service: General;  Laterality: Right;  MALONEY DILATION N/A 08/07/2018   Procedure: Venia Minks DILATION;  Surgeon: Danie Binder, MD;  [facility];  Service: Endoscopy;  Laterality: N/A;   none     SAVORY DILATION N/A 08/07/2018   Procedure: SAVORY DILATION;  Surgeon: Danie Binder, MD;  [facility];  Service: Endoscopy;  Laterality: N/A;   WISDOM TOOTH EXTRACTION      Current Medications: Current Meds  Medication Sig   loratadine (CLARITIN) 10 MG tablet Take 10 mg by mouth daily.   metFORMIN (GLUCOPHAGE) 500 MG tablet Take 500 mg by mouth 2 (two) times daily with a meal.   Vitamin D, Ergocalciferol, (DRISDOL) 50000 UNITS CAPS capsule Take 50,000 Units by mouth every Saturday.      Allergies:   Dificid [fidaxomicin], Amoxicillin, Clindamycin/lincomycin, and Heparin   Social History   Socioeconomic History    Marital status: Married    Spouse name: CHRISTOPHER    Number of children: Not on file   Years of education: Not on file   Highest education level: Not on file  Occupational History   Occupation: PHLEBOTOMIST    Employer: [employer]  Tobacco Use   Smoking status: Former    Packs/day: 1.00    Years: 12.00    Pack years: 12.00    Types: Cigarettes    Start date: 01/21/2004    Quit date: 09/10/2011    Years since quitting: 9.8   Smokeless tobacco: Never  Vaping Use   Vaping Use: Never used  Substance and Sexual Activity   Alcohol use: Never    Alcohol/week: 0.0 standard drinks   Drug use: No   Sexual activity: Yes  Other Topics Concern   Not on file  Social History Narrative   Lives in [place], [place], [place] with husband.  Works at [employer] as a Charity fundraiser. [place]: 36 YO.   Social Determinants of Health   Financial Resource Strain: Not on file  Food Insecurity: Not on file  Transportation Needs: Not on file  Physical Activity: Not on file  Stress: Not on file  Social Connections: Not on file     Family History: The patient's family history includes Arrhythmia (age of onset: 76) in her mother; COPD in her mother; Colon cancer in her maternal uncle; Colon polyps in her mother; Crohn's disease in her cousin; Diabetes in her mother, sister, and another family member; Hypertension in her father, mother, sister, and another family member.  ROS:   Please see the history of present illness.     All other systems reviewed and are negative.  EKGs/Labs/Other Studies Reviewed:    The following studies were reviewed today:   EKG:  EKG  Oct. 11, 2022,  no ST or T abn.  Recent Labs: 04/20/2021: ALT 17; BUN 12; Creatinine, Ser 0.91; Hemoglobin 14.6; Magnesium 2.0; Platelets 227; Potassium 3.8; Sodium 141 04/24/2021: TSH 1.970  Recent Lipid Panel No results found for: CHOL, TRIG, HDL, CHOLHDL, VLDL, LDLCALC, LDLDIRECT   Risk  Assessment/Calculations:           Physical Exam:    VS:  BP 126/80    Pulse 68    Ht 5\' 8"  (1.727 m)    Wt (!) 301 lb (136.5 kg)    SpO2 97%    BMI 45.77 kg/m     Wt Readings from Last 3 Encounters:  07/27/21 (!) 301 lb (136.5 kg)  07/15/21 299 lb (135.6 kg)  04/24/21 (!) 309 lb 12.8 oz (  140.5 kg)     GEN: morbidly obese female,  well developed in no acute distress HEENT: Normal NECK: No JVD; No carotid bruits LYMPHATICS: No lymphadenopathy CARDIAC: NSR  RESPIRATORY:  Clear to auscultation without rales, wheezing or rhonchi  ABDOMEN: Soft, non-tender, non-distended MUSCULOSKELETAL:  No edema; No deformity  SKIN: Warm and dry NEUROLOGIC:  Alert and oriented x 3 PSYCHIATRIC:  Normal affect   ASSESSMENT:    1. Atrial fibrillation, unspecified type ([place] )     PLAN:       PAF :  CHADS2VASC is 1 ( female )  No indication for anticoagulation at this point.  She still has palpitations but they do not sound like episodes of atrial fibrillation. I strongly advised weight loss.  She needs a repeat sleep study referral.  I have written her prescription for propranolol again since she did not pick it up the first time.   Strongly advised weight loss.  She needs to avoid carbohydrates.  I specifically advised her to avoid foods that are white, weak, sweet.  We will have her follow-up with an APP in 3 months.        Medication Adjustments/Labs and Tests Ordered: Current medicines are reviewed at length with the patient today.  Concerns regarding medicines are outlined above.  Orders Placed This Encounter  Procedures   Split night study   Meds ordered this encounter  Medications   propranolol (INDERAL) 10 MG tablet    Sig: Take 1 tablet (10 mg total) by mouth 4 (four) times daily as needed (palpitations).    Dispense:  120 tablet    Refill:  5    Patient Instructions  Medication Instructions:  *If you need a refill on your cardiac medications before your next  appointment, please call your pharmacy*  Lab Work: If you have labs (blood work) drawn today and your tests are completely normal, you will receive your results only by: [place] (if you have MyChart) OR A paper copy in the mail If you have any lab test that is abnormal or we need to change your treatment, we will call you to review the results.  Testing/Procedures: Your physician has recommended that you have a sleep study. This test records several body functions during sleep, including: brain activity, eye movement, oxygen and carbon dioxide blood levels, heart rate and rhythm, breathing rate and rhythm, the flow of air through your mouth and nose, snoring, body muscle movements, and chest and belly movement.  Follow-Up: At [facility], you and your health needs are our priority.  As part of our continuing mission to provide you with exceptional heart care, we have created designated Provider Care Teams.  These Care Teams include your primary Cardiologist (physician) and Advanced Practice Providers (APPs -  Physician Assistants and Nurse Practitioners) who all work together to provide you with the care you need, when you need it.  We recommend signing up for the patient portal called "MyChart".  Sign up information is provided on this After Visit Summary.  MyChart is used to connect with patients for Virtual Visits (Telemedicine).  Patients are able to view lab/test results, encounter notes, upcoming appointments, etc.  Non-urgent messages can be sent to your provider as well.   To learn more about what you can do with MyChart, go to [email or web address].    Your next appointment:   3 to 4 month(s)  The format for your next appointment:   In Person  Provider:   Dr. Grayland Jack or  Christen Bame, NP or Richardson Dopp, PA-C            Signed, Mertie Moores, MD  07/27/2021 5:06 PM    [place]

## 2021-07-27 NOTE — Patient Instructions (Addendum)
Medication Instructions:  *If you need a refill on your cardiac medications before your next appointment, please call your pharmacy*  Lab Work: If you have labs (blood work) drawn today and your tests are completely normal, you will receive your results only by: MyChart Message (if you have MyChart) OR A paper copy in the mail If you have any lab test that is abnormal or we need to change your treatment, we will call you to review the results.  Testing/Procedures: Your physician has recommended that you have a sleep study. This test records several body functions during sleep, including: brain activity, eye movement, oxygen and carbon dioxide blood levels, heart rate and rhythm, breathing rate and rhythm, the flow of air through your mouth and nose, snoring, body muscle movements, and chest and belly movement.  Follow-Up: At [facility], you and your health needs are our priority.  As part of our continuing mission to provide you with exceptional heart care, we have created designated Provider Care Teams.  These Care Teams include your primary Cardiologist (physician) and Advanced Practice Providers (APPs -  Physician Assistants and Nurse Practitioners) who all work together to provide you with the care you need, when you need it.  We recommend signing up for the patient portal called "MyChart".  Sign up information is provided on this After Visit Summary.  MyChart is used to connect with patients for Virtual Visits (Telemedicine).  Patients are able to view lab/test results, encounter notes, upcoming appointments, etc.  Non-urgent messages can be sent to your provider as well.   To learn more about what you can do with MyChart, go to [email or web address].    Your next appointment:   3 to 4 month(s)  The format for your next appointment:   In Person  Provider:   Dr. Leodis Sias or Eligha Bridegroom, NP or Tereso Newcomer, PA-C

## 2021-08-03 DIAGNOSIS — K219 Gastro-esophageal reflux disease without esophagitis: Secondary | ICD-10-CM | POA: Diagnosis not present

## 2021-08-03 DIAGNOSIS — J329 Chronic sinusitis, unspecified: Secondary | ICD-10-CM | POA: Diagnosis not present

## 2021-08-03 DIAGNOSIS — J040 Acute laryngitis: Secondary | ICD-10-CM | POA: Diagnosis not present

## 2021-08-03 DIAGNOSIS — Z299 Encounter for prophylactic measures, unspecified: Secondary | ICD-10-CM | POA: Diagnosis not present

## 2021-08-13 DIAGNOSIS — Z299 Encounter for prophylactic measures, unspecified: Secondary | ICD-10-CM | POA: Diagnosis not present

## 2021-08-13 DIAGNOSIS — R1031 Right lower quadrant pain: Secondary | ICD-10-CM | POA: Diagnosis not present

## 2021-08-13 DIAGNOSIS — I4891 Unspecified atrial fibrillation: Secondary | ICD-10-CM | POA: Diagnosis not present

## 2021-08-13 DIAGNOSIS — Z6841 Body Mass Index (BMI) 40.0 and over, adult: Secondary | ICD-10-CM | POA: Diagnosis not present

## 2021-08-13 DIAGNOSIS — Z789 Other specified health status: Secondary | ICD-10-CM | POA: Diagnosis not present

## 2021-08-15 DIAGNOSIS — R1031 Right lower quadrant pain: Secondary | ICD-10-CM | POA: Diagnosis not present

## 2021-08-15 DIAGNOSIS — R109 Unspecified abdominal pain: Secondary | ICD-10-CM | POA: Diagnosis not present

## 2021-08-15 DIAGNOSIS — R1010 Upper abdominal pain, unspecified: Secondary | ICD-10-CM | POA: Diagnosis not present

## 2021-09-17 DIAGNOSIS — H6690 Otitis media, unspecified, unspecified ear: Secondary | ICD-10-CM | POA: Diagnosis not present

## 2021-09-17 DIAGNOSIS — I4891 Unspecified atrial fibrillation: Secondary | ICD-10-CM | POA: Diagnosis not present

## 2021-09-17 DIAGNOSIS — I10 Essential (primary) hypertension: Secondary | ICD-10-CM | POA: Diagnosis not present

## 2021-09-17 DIAGNOSIS — Z2821 Immunization not carried out because of patient refusal: Secondary | ICD-10-CM | POA: Diagnosis not present

## 2021-09-17 DIAGNOSIS — Z6841 Body Mass Index (BMI) 40.0 and over, adult: Secondary | ICD-10-CM | POA: Diagnosis not present

## 2021-09-17 DIAGNOSIS — Z789 Other specified health status: Secondary | ICD-10-CM | POA: Diagnosis not present

## 2021-09-17 DIAGNOSIS — Z299 Encounter for prophylactic measures, unspecified: Secondary | ICD-10-CM | POA: Diagnosis not present

## 2021-09-18 ENCOUNTER — Other Ambulatory Visit: Payer: Self-pay | Admitting: Internal Medicine

## 2021-09-18 DIAGNOSIS — N63 Unspecified lump in unspecified breast: Secondary | ICD-10-CM

## 2021-09-19 DIAGNOSIS — K113 Abscess of salivary gland: Secondary | ICD-10-CM | POA: Diagnosis not present

## 2021-09-19 DIAGNOSIS — H6690 Otitis media, unspecified, unspecified ear: Secondary | ICD-10-CM | POA: Diagnosis not present

## 2021-09-19 DIAGNOSIS — Z789 Other specified health status: Secondary | ICD-10-CM | POA: Diagnosis not present

## 2021-09-19 DIAGNOSIS — Z299 Encounter for prophylactic measures, unspecified: Secondary | ICD-10-CM | POA: Diagnosis not present

## 2021-09-20 DIAGNOSIS — Z299 Encounter for prophylactic measures, unspecified: Secondary | ICD-10-CM | POA: Diagnosis not present

## 2021-09-20 DIAGNOSIS — D509 Iron deficiency anemia, unspecified: Secondary | ICD-10-CM | POA: Diagnosis not present

## 2021-09-20 DIAGNOSIS — R070 Pain in throat: Secondary | ICD-10-CM | POA: Diagnosis not present

## 2021-09-20 DIAGNOSIS — N926 Irregular menstruation, unspecified: Secondary | ICD-10-CM | POA: Diagnosis not present

## 2021-09-20 DIAGNOSIS — R350 Frequency of micturition: Secondary | ICD-10-CM | POA: Diagnosis not present

## 2021-09-24 ENCOUNTER — Ambulatory Visit: Payer: BC Managed Care – PPO | Admitting: Cardiovascular Disease

## 2021-09-24 NOTE — Telephone Encounter (Signed)
APPROVED ?10-13-21 TO 01-11-22 ?# ID  [id number] ?AUTH [id number] ?

## 2021-10-08 NOTE — Telephone Encounter (Signed)
Patient is scheduled for lab study on 11/14/21. ?Patient understands her sleep [facility] at [facility]. ?Patient understands she will receive a sleep packet in a week or so. ?Patient understands to call if she does not receive the sleep packet in a timely manner. ?Patient agrees with treatment and thanked me for call. ?

## 2021-10-09 ENCOUNTER — Ambulatory Visit
Admission: RE | Admit: 2021-10-09 | Discharge: 2021-10-09 | Disposition: A | Discharge status: Home / Self Care | Payer: BC Managed Care – PPO | Source: Ambulatory Visit | Attending: Internal Medicine | Admitting: Internal Medicine

## 2021-10-09 ENCOUNTER — Other Ambulatory Visit: Payer: Self-pay | Admitting: Internal Medicine

## 2021-10-09 ENCOUNTER — Ambulatory Visit: Payer: BC Managed Care – PPO

## 2021-10-09 ENCOUNTER — Other Ambulatory Visit: Payer: Self-pay

## 2021-10-09 DIAGNOSIS — Z1231 Encounter for screening mammogram for malignant neoplasm of breast: Secondary | ICD-10-CM | POA: Diagnosis not present

## 2021-10-09 DIAGNOSIS — N63 Unspecified lump in unspecified breast: Secondary | ICD-10-CM

## 2021-10-23 DIAGNOSIS — Z31430 Encounter of female for testing for genetic disease carrier status for procreative management: Secondary | ICD-10-CM | POA: Diagnosis not present

## 2021-10-23 DIAGNOSIS — Z319 Encounter for procreative management, unspecified: Secondary | ICD-10-CM | POA: Diagnosis not present

## 2021-10-23 DIAGNOSIS — N912 Amenorrhea, unspecified: Secondary | ICD-10-CM | POA: Diagnosis not present

## 2021-10-23 DIAGNOSIS — E282 Polycystic ovarian syndrome: Secondary | ICD-10-CM | POA: Diagnosis not present

## 2021-10-23 DIAGNOSIS — N970 Female infertility associated with anovulation: Secondary | ICD-10-CM | POA: Diagnosis not present

## 2021-10-25 ENCOUNTER — Encounter: Payer: Self-pay | Admitting: Cardiovascular Disease

## 2021-10-25 ENCOUNTER — Ambulatory Visit (INDEPENDENT_AMBULATORY_CARE_PROVIDER_SITE_OTHER): Payer: BC Managed Care – PPO | Admitting: Cardiovascular Disease

## 2021-10-25 VITALS — BP 116/70 | HR 75 | Ht 69.0 in | Wt 296.2 lb

## 2021-10-25 DIAGNOSIS — I4891 Unspecified atrial fibrillation: Secondary | ICD-10-CM

## 2021-10-25 DIAGNOSIS — I480 Paroxysmal atrial fibrillation: Secondary | ICD-10-CM

## 2021-10-25 LAB — BASIC METABOLIC PANEL
BUN/Creatinine Ratio: 12 (ref 9–23)
BUN: 10 mg/dL (ref 6–20)
CO2: 22 mmol/L (ref 20–29)
Calcium: 9 mg/dL (ref 8.7–10.2)
Chloride: 106 mmol/L (ref 96–106)
Creatinine, Ser: 0.83 mg/dL (ref 0.57–1.00)
Glucose: 91 mg/dL (ref 70–99)
Potassium: 4.3 mmol/L (ref 3.5–5.2)
Sodium: 140 mmol/L (ref 134–144)
eGFR: 93 mL/min/{1.73_m2} (ref >59)

## 2021-10-25 LAB — HEMOGLOBIN A1C
Est. average glucose Bld gHb Est-mCnc: 114 mg/dL
Hgb A1c MFr Bld: 5.6 % (ref 4.8–5.6)

## 2021-10-25 NOTE — Progress Notes (Signed)
?Cardiology Office Note:   ? ?Date:  10/25/2021  ? ?ID:  Deborah Riddle, DOB 13-Mar-1986, MRN [place]:[id number] ? ?PCP:  Glenda Chroman, MD ?  ?[facility] Providers ?Cardiologist:  Davelyn Gwinn  ? ? ?Referring MD: Glenda Chroman, MD  ? ?Chief Complaint  ?Patient presents with  ? Atrial Fibrillation  ?   ?  ?  ? ?Oct. 11, 2022  ? ?Deborah Riddle is a 36 y.o. female with a hx of paroxysmal atrial fibrillation, morbid obesity  ? ?She has seen Dr. Zenda Alpers sworn, Dr. Fletcher Anon , and Dr. Rayann Heman in the past.  She has been tried on flecainide but did not tolerate it due to wheezing. ? ?She had a sleep study done at [facility].  We do not have the results for that. ? ?She has been referred to the A. fib clinic as well as the bariatric surgical clinic at [facility].  She has not been to either .  ?Wt was 293 lbs as of June 2016.  ? ?She was seen by Jory Sims, NP for follow-up in December, 2017.  She is starting verapamil and was encouraged to retry flecainide 50 mg twice a day. ? ? ?She has not had any atrial fib for years. ?She has been under lots of stress with the passing of her mother last November.  ?Has started drinknig herbal tea ?Husband says she snores at night  - not sure she has apneic episodes. ? ?On Oct. 7, she work up with palpitations and was found to have atrial fib .   She received IV Cardizem, IV fluids.  ?Decided not to have a cardioversion ?Went back to bed , work up with normal sinus rhythm ?She has not been on Flecainide.  ? ? ? ?Does not get any regular exercise  ? ?Works as a Museum/gallery conservator - Education officer, museum for a clinic in [place]  ?Wt today is 310 lbs. ? ?July 27, 2021: ?Patient is seen today for follow-up of her paroxysmal atrial fibrillation and morbid obesity   wt is 301 lbs . ( Down 9 lbs )  ?Tried to schedule her sleep study but could not , the referal has expired  ?Has had palpitations ( increased HR into the 90s)when she is waking up or just going to bed   ?Palpitations last night lasted many hours . ? ?Is walking 30 minutes, 4 times a week  ? ? ?October 25, 2021: ?Quietly she is seen today for follow-up of her paroxysmal atrial fibrillation and morbid obesity.  Current weight is 296 pounds which is down 5 pounds from January of this year. ?Still working on more weight loss  ?Is on metformin for PCOS  ?Will get hemoglobin A1c level today.  She would like to try one of the weight loss medications such as [id number]. ? ? ?Past Medical History:  ?Diagnosis Date  ? Body mass index 40.0-44.9, adult ([place])   ? Chest pain, unspecified   ? Dysrhythmia   ? AFib  ? GERD (gastroesophageal reflux disease)   ? Morbid obesity ([place])   ? Paroxysmal atrial fibrillation (HCC)   ? Polycystic disease, ovaries   ? Sickle cell trait ([place])   ? Tobacco use disorder   ? ? ?Past Surgical History:  ?Procedure Laterality Date  ? BIOPSY  08/07/2018  ? Procedure: BIOPSY;  Surgeon: Danie Binder, MD;  [facility];  Service: Endoscopy;;  gastric bx's  ? DILATION AND EVACUATION  07/07/2020  ?  Procedure: DILATATION AND EVACUATION;  Surgeon: Servando Salina, MD;  Location: [place];  Service: Gynecology;;  ? ESOPHAGOGASTRODUODENOSCOPY N/A 08/07/2018  ? Procedure: ESOPHAGOGASTRODUODENOSCOPY (EGD);  Surgeon: Danie Binder, MD;  [facility];  Service: Endoscopy;  Laterality: N/A;  3:00pm  ? HYDRADENITIS EXCISION Right 05/29/2018  ? Procedure: EXCISION HIDRADENITIS AXILLA;  Surgeon: Virl Cagey, MD;  [facility];  Service: General;  Laterality: Right;  ? MALONEY DILATION N/A 08/07/2018  ? Procedure: MALONEY DILATION;  Surgeon: Danie Binder, MD;  [facility];  Service: Endoscopy;  Laterality: N/A;  ? none    ? SAVORY DILATION N/A 08/07/2018  ? Procedure: SAVORY DILATION;  Surgeon: Danie Binder, MD;  [facility];  Service: Endoscopy;  Laterality: N/A;  ? WISDOM TOOTH EXTRACTION    ? ? ?Current Medications: ?Current Meds  ?Medication Sig  ?  Inositol-D Chiro-Inositol (OVASITOL) 2000-50 MG PACK See admin instructions.  ? loratadine (CLARITIN) 10 MG tablet Take 10 mg by mouth daily.  ? medroxyPROGESTERone Acetate (PROVERA PO) Take by mouth.  ? metFORMIN (GLUCOPHAGE) 500 MG tablet Take 500 mg by mouth 2 (two) times daily with a meal.  ? propranolol (INDERAL) 10 MG tablet Take 1 tablet (10 mg total) by mouth 4 (four) times daily as needed (palpitations).  ?  ? ?Allergies:   Dificid [fidaxomicin], Amoxicillin, Clindamycin/lincomycin, and Heparin  ? ?Social History  ? ?Socioeconomic History  ? Marital status: Married  ?  Spouse name: CHRISTOPHER   ? Number of children: Not on file  ? Years of education: Not on file  ? Highest education level: Not on file  ?Occupational History  ? Occupation: PHLEBOTOMIST  ?  Employer: [employer]  ?Tobacco Use  ? Smoking status: Former  ?  Packs/day: 1.00  ?  Years: 12.00  ?  Pack years: 12.00  ?  Types: Cigarettes  ?  Start date: 01/21/2004  ?  Quit date: 09/10/2011  ?  Years since quitting: 10.1  ? Smokeless tobacco: Never  ?Vaping Use  ? Vaping Use: Never used  ?Substance and Sexual Activity  ? Alcohol use: Never  ?  Alcohol/week: 0.0 standard drinks  ? Drug use: No  ? Sexual activity: Yes  ?Other Topics Concern  ? Not on file  ?Social History Narrative  ? Lives in [place], [place], [place] with husband.  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  ? ?Social Determinants of Health  ? ?Financial Resource Strain: Not on file  ?Food Insecurity: Not on file  ?Transportation Needs: Not on file  ?Physical Activity: Not on file  ?Stress: Not on file  ?Social Connections: Not on file  ?  ? ?Family History: ?The patient's family history includes Arrhythmia (age of onset: 5) in her mother; COPD in her mother; Colon cancer in her maternal uncle; Colon polyps in her mother; Crohn's disease in her cousin; Diabetes in her mother, sister, and another family member; Hypertension in her father, mother,  sister, and another family member. ? ?ROS:   ?Please see the history of present illness.    ? All other systems reviewed and are negative. ? ?EKGs/Labs/Other Studies Reviewed:   ? ?The following studies were reviewed today: ? ? ?EKG:    ? ?Recent Labs: ?04/20/2021: ALT 17; Hemoglobin 14.6; Magnesium 2.0; Platelets 227 ?04/24/2021: TSH 1.970 ?10/25/2021: BUN 10; Creatinine, Ser 0.83; Potassium 4.3; Sodium 140  ?Recent Lipid Panel ?No results found for: CHOL, TRIG,  HDL, CHOLHDL, VLDL, LDLCALC, LDLDIRECT ? ? ?Risk Assessment/Calculations:   ? ? ? ?    ? ?Physical Exam:   ? ?Physical Exam: ?Blood pressure 116/70, pulse 75, height 5\' 9"  (1.753 m), weight 296 lb 3.2 oz (134.4 kg), SpO2 98 %. ? ?GEN:  obese, young female  in no acute distress ?HEENT: Normal ?NECK: No JVD; No carotid bruits ?LYMPHATICS: No lymphadenopathy ?CARDIAC: RRR , no murmurs, rubs, gallops ?RESPIRATORY:  Clear to auscultation without rales, wheezing or rhonchi  ?ABDOMEN: Soft, non-tender, non-distended ?MUSCULOSKELETAL:  No edema; No deformity  ?SKIN: Warm and dry ?NEUROLOGIC:  Alert and oriented x 3 ? ?ASSESSMENT:   ? ?1. Atrial fibrillation, unspecified type ([place])   ?2. Morbid obesity ([place])   ?3. Paroxysmal atrial fibrillation ([place])   ? ? ? ?PLAN:   ? ? ?  PAF :  CHADS2VASC is 1 ( female )  ?She only has very rare episodes of palpitations that last for a few seconds.  No sustained palpitations that would make me think that she needs any further treatment for her paroxysmal atrial fibrillation.  I encouraged weight loss.  She is getting a sleep study in May.  If she has sleep apnea we will refer her to Dr. Radford Pax. ?  ? ?2.  Obesity:   will draw a HBA1C and see if she qualifies for [facility] .  ? ?Will see an APP in  1 year .  ?   ? ? ?Medication Adjustments/Labs and Tests Ordered: ?Current medicines are reviewed at length with the patient today.  Concerns regarding medicines are outlined above.  ?Orders Placed This Encounter  ?Procedures  ? Basic  metabolic panel  ? Hemoglobin A1c  ? ?No orders of the defined types were placed in this encounter. ? ? ?Patient Instructions  ?Medication Instructions:  ?Your physician recommends that you continue on your current medications as

## 2021-10-25 NOTE — Patient Instructions (Signed)
Medication Instructions:  ?Your physician recommends that you continue on your current medications as directed. Please refer to the Current Medication list given to you today. ? ?*If you need a refill on your cardiac medications before your next appointment, please call your pharmacy* ? ?Lab Work: ?TODAY: Hgb A1c, BMP ?If you have labs (blood work) drawn today and your tests are completely normal, you will receive your results only by: ?MyChart Message (if you have MyChart) OR ?A paper copy in the mail ?If you have any lab test that is abnormal or we need to change your treatment, we will call you to review the results. ? ?Testing/Procedures: ?NONE ? ?Follow-Up: ?At [facility], you and your health needs are our priority.  As part of our continuing mission to provide you with exceptional heart care, we have created designated Provider Care Teams.  These Care Teams include your primary Cardiologist (physician) and Advanced Practice Providers (APPs -  Physician Assistants and Nurse Practitioners) who all work together to provide you with the care you need, when you need it. ? ?Your next appointment:   ?1 year(s) ? ?The format for your next appointment:   ?In Person ? ?Provider:   ?Chelsea Aus, PA-C, Eligha Bridegroom, NP, or Tereso Newcomer, PA-C  ? ?Other Instructions ? ?Important Information About Sugar ? ? ? ? ?  ?

## 2021-11-14 ENCOUNTER — Ambulatory Visit: Payer: BC Managed Care – PPO | Attending: Cardiovascular Disease | Admitting: Cardiology

## 2021-11-14 DIAGNOSIS — G4733 Obstructive sleep apnea (adult) (pediatric): Secondary | ICD-10-CM

## 2021-11-14 DIAGNOSIS — I4891 Unspecified atrial fibrillation: Secondary | ICD-10-CM | POA: Insufficient documentation

## 2021-11-16 NOTE — Procedures (Signed)
? ?  Patient Name: Deborah Riddle, Deborah Riddle ?Study Date:11/14/2021 ?Gender: Female ?D.O.B: 1986-03-19 ?Age (years): 41 ?Referring Provider: Kristeen Miss ?Height (inches): 65 ?Interpreting Physician: Armanda Magic MD, ABSM ?Weight (lbs): 265 ?RPSGT: Alfonso Ellis ?BMI: 44 ?MRN: [id number] ?Neck Size: 16.00 ? ?CLINICAL INFORMATION ?Sleep Study Type: NPSG ? ?Indication for sleep study: N/A ? ?Epworth Sleepiness Score: 13 ? ?SLEEP STUDY TECHNIQUE ?As per the AASM Manual for the Scoring of Sleep and Associated Events v2.3 (April 2016) with a hypopnea requiring 4% desaturations. ? ?The channels recorded and monitored were frontal, central and occipital EEG, electrooculogram (EOG), submentalis EMG (chin), nasal and oral airflow, thoracic and abdominal wall motion, anterior tibialis EMG, snore microphone, electrocardiogram, and pulse oximetry. ? ?MEDICATIONS ?Medications self-administered by patient taken the night of the study : N/A ? ?SLEEP ARCHITECTURE ?The study was initiated at 10:12:55 PM and ended at 4:54:06 AM. ? ?Sleep onset time was 32.7 minutes and the sleep efficiency was 87.7%. The total sleep time was 352 minutes. ? ?Stage REM latency was 75.0 minutes. ? ?The patient spent 1.85% of the night in stage N1 sleep, 59.52% in stage N2 sleep, 16.62% in stage N3 and 22% in REM. ? ?Alpha intrusion was absent. ? ?Supine sleep was 17.82%. ? ?RESPIRATORY PARAMETERS ?The overall apnea/hypopnea index (AHI) was 8.0 per hour. There were 0 total apneas, including 0 obstructive, 0 central and 0 mixed apneas. There were 47 hypopneas and 0 RERAs. ? ?The AHI during Stage REM sleep was 33.3 per hour. ? ?AHI while supine was 21.0 per hour. ? ?The mean oxygen saturation was 93.88%. The minimum SpO2 during sleep was 84.00%. ? ?moderate snoring was noted during this study. ? ?CARDIAC DATA ?The 2 lead EKG demonstrated sinus rhythm. The mean heart rate was 64.74 beats per minute. Other EKG findings include: None. ? ?LEG MOVEMENT DATA ?The  total PLMS were 216 with a resulting PLMS index of 36.82. Associated arousal with leg movement index was 2.0 . ? ?IMPRESSIONS ?- Mild obstructive sleep apnea occurred during this study (AHI = 8.0/h) overall but severe during REM sleep. ?- Mild oxygen desaturation was noted during this study (Min O2 = 84.00%). ?- The patient snored with moderate snoring volume. ?- No cardiac abnormalities were noted during this study. 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 No significant associated arousals ? ?DIAGNOSIS ?- Obstructive Sleep Apnea (G47.33) ? ?RECOMMENDATIONS ?- Mild obstructive sleep apnea occurred during this study (AHI = 8.0/h) overall but severe during REM sleep.  Therefore recommend in lab CPAP titration.  ?- Avoid alcohol, sedatives and other CNS depressants that may worsen sleep apnea and disrupt normal sleep architecture. ?- Sleep hygiene should be reviewed to assess factors that may improve sleep quality. ?- Weight management and regular exercise should be initiated or continued if appropriate. ? ?[Electronically signed] 11/16/2021 05:08 PM ? ?Armanda Magic MD, ABSM ?Diplomate, Biomedical engineer of Sleep Medicine ?

## 2021-11-25 ENCOUNTER — Encounter: Payer: Self-pay | Admitting: Cardiovascular Disease

## 2021-11-26 ENCOUNTER — Telehealth: Payer: Self-pay | Admitting: *Deleted

## 2021-11-26 DIAGNOSIS — G4733 Obstructive sleep apnea (adult) (pediatric): Secondary | ICD-10-CM

## 2021-11-26 NOTE — Telephone Encounter (Signed)
-----   Message from Gaynelle Cage, CMA sent at 11/19/2021  9:03 AM EDT ----- ? ?----- Message ----- ?From: Quintella Reichert, MD ?Sent: 11/16/2021   5:09 PM EDT ?To: Cv Div Sleep Studies ? ?Please let patient know that they have sleep apnea.  Recommend therapeutic CPAP titration for treatment of patient's sleep disordered breathing.  If unable to perform an in lab titration then initiate ResMed auto CPAP from 4 to 15cm H2O with heated humidity and mask of choice and overnight pulse ox on CPAP.    ? ?

## 2021-11-26 NOTE — Telephone Encounter (Signed)
The patient has been notified of the result and verbalized understanding.  All questions (if any) were answered. ?Latrelle Dodrill, CMA 11/26/2021 8:52 AM   ? ?Precert titration ?

## 2021-12-06 ENCOUNTER — Telehealth: Payer: Self-pay | Admitting: Cardiovascular Disease

## 2021-12-06 DIAGNOSIS — E282 Polycystic ovarian syndrome: Secondary | ICD-10-CM | POA: Diagnosis not present

## 2021-12-06 DIAGNOSIS — E288 Other ovarian dysfunction: Secondary | ICD-10-CM | POA: Diagnosis not present

## 2021-12-06 DIAGNOSIS — Z3141 Encounter for fertility testing: Secondary | ICD-10-CM | POA: Diagnosis not present

## 2021-12-06 DIAGNOSIS — N912 Amenorrhea, unspecified: Secondary | ICD-10-CM | POA: Diagnosis not present

## 2021-12-06 DIAGNOSIS — Z3189 Encounter for other procreative management: Secondary | ICD-10-CM | POA: Diagnosis not present

## 2021-12-06 DIAGNOSIS — N96 Recurrent pregnancy loss: Secondary | ICD-10-CM | POA: Diagnosis not present

## 2021-12-06 NOTE — Telephone Encounter (Signed)
Pt c/o medication issue:  1. Name of Medication: propranolol (INDERAL) 10 MG tablet  2. How are you currently taking this medication (dosage and times per day)? Has not taken due to it being as needed.   3. Are you having a reaction (difficulty breathing--STAT)? No   4. What is your medication issue? Patient is calling stating she is going to get IVF and the anesthesiologist recommended discussing an everyday beta blocker instead of propranolol as needed. She is requesting a callback to discuss.

## 2021-12-07 NOTE — Telephone Encounter (Signed)
Called to let pt know that Dr Acie Fredrickson will be out of office until 6/2, but we will touch base with her when we have an answer from him. She states that is fine, she just needs the decision to be made and medication to have been started by 12/31/21 when she is scheduled for her next retrieval (IVF). She also asks about her follow-up sleep study. She was told we would know something in 7 days and she hasn't heard anything. I will route to Gershon Cull for review and to reach out to patient.

## 2021-12-07 NOTE — Telephone Encounter (Signed)
Dr Elease Hashimoto is out of the office this week   Will forward for him to review on return re prn Inderal

## 2021-12-11 MED ORDER — DILTIAZEM HCL ER BEADS 120 MG PO CP24: 120.0000 mg | ORAL_CAPSULE | Freq: Every day | ORAL | 11 refills | Status: DC

## 2021-12-11 NOTE — Addendum Note (Signed)
Addended by: Lars Mage on: 12/11/2021 10:43 AM   Modules accepted: Orders

## 2021-12-11 NOTE — Telephone Encounter (Signed)
Pt calling to get update on medication questions. Please advise.

## 2021-12-11 NOTE — Telephone Encounter (Signed)
Nahser, Deloris Ping, MD  You 4 hours ago (5:40 AM)   She is on scheduled Diltiazem and PRN propranolol  This is a fine medication regimen for her .  If she finds that she feels better with the propranolol and is in fact taking is 3-4 times a day, then we can switch her to metoprolol which would reduce the number of times she has to take the medication to twice a day   It is truly completely optional of which way she wants to take the med.  Im sorry that  the anesthesiologist's comments caused her the additional concern.   PN    Pt states that she is only taking as needed propranolol, as she had run out of her Diltiazem. She states that she has not actually used the propranolol since last seen in office here, but is requesting that we send in refill on Diltiazem. Her anesthesiologist wanted her on something the morning of the procedure stating that the IVF procedure has been known to throw someone into "a funky rhythm and states they don't have anything in office to give to fix." Pt understands that we can switch to Metoprolol IF she finds herself taking the propranolol 3-4 times daily consistently in addition to Diltiazem. Refill for Dilt sent in at this time.

## 2021-12-12 NOTE — Telephone Encounter (Signed)
Patient sleep study is awaiting insurance approval.

## 2021-12-18 ENCOUNTER — Ambulatory Visit: Payer: BC Managed Care – PPO | Admitting: Cardiovascular Disease

## 2022-01-03 NOTE — Telephone Encounter (Signed)
Prior Authorization for TITRATION sent to [facility] via Phone. APPROVED- July 15th-Aug 15th for the sleep lab. (Approval number [id number]).

## 2022-01-10 ENCOUNTER — Ambulatory Visit (HOSPITAL_BASED_OUTPATIENT_CLINIC_OR_DEPARTMENT_OTHER): Payer: BC Managed Care – PPO | Attending: Cardiology | Admitting: Cardiology

## 2022-01-10 DIAGNOSIS — G4733 Obstructive sleep apnea (adult) (pediatric): Secondary | ICD-10-CM

## 2022-01-11 NOTE — Procedures (Signed)
    Patient Name: Deborah Riddle Study Date: 01/10/2022 Gender: Female D.O.B: [birth year] Age (years): 35 Referring Provider: Kristeen Miss Height (inches): 65 Interpreting Physician: Armanda Magic MD, ABSM Weight (lbs): 295 RPSGT: Lowry Ram BMI: 49 MRN: [id number] Neck Size: 16.00  CLINICAL INFORMATION The patient is referred for a CPAP titration to treat sleep apnea.  SLEEP STUDY TECHNIQUE As per the AASM Manual for the Scoring of Sleep and Associated Events v2.3 (April 2016) with a hypopnea requiring 4% desaturations.  The channels recorded and monitored were frontal, central and occipital EEG, electrooculogram (EOG), submentalis EMG (chin), nasal and oral airflow, thoracic and abdominal wall motion, anterior tibialis EMG, snore microphone, electrocardiogram, and pulse oximetry. Continuous positive airway pressure (CPAP) was initiated at the beginning of the study and titrated to treat sleep-disordered breathing.  MEDICATIONS Medications self-administered by patient taken the night of the study : N/A  TECHNICIAN COMMENTS Comments added by technician: None Comments added by scorer: N/A  RESPIRATORY PARAMETERS Optimal PAP Pressure (cm):10  AHI at Optimal Pressure (/hr):0 Overall Minimal O2 (%):86.0  Supine % at Optimal Pressure (%):100 Minimal O2 at Optimal Pressure (%): 93.0   SLEEP ARCHITECTURE The study was initiated at 10:23:50 PM and ended at 4:48:04 AM.  Sleep onset time was 0.3 minutes and the sleep efficiency was 96.4%. The total sleep time was 370.5 minutes.  The patient spent 2.8% of the night in stage N1 sleep, 76.9% in stage N2 sleep, 0.0% in stage N3 and 20.2% in REM.Stage REM latency was 54.5 minutes  Wake after sleep onset was 13.4. Alpha intrusion was absent. Supine sleep was 32.14%.  CARDIAC DATA The 2 lead EKG demonstrated sinus rhythm. The mean heart rate was 80.7 beats per minute. Other EKG findings include: None.  LEG MOVEMENT DATA The  total Periodic Limb Movements of Sleep (PLMS) were 0. The PLMS index was 0.0. A PLMS index of <15 is considered normal in adults.  IMPRESSIONS - The optimal PAP pressure was 10 cm of water. - Moderate oxygen desaturations were observed during this titration (min O2 = 86.0%). - The patient snored with soft snoring volume during this titration study. - No cardiac abnormalities were observed during this study. - Clinically significant periodic limb movements were not noted during this study. Arousals associated with PLMs were rare.  DIAGNOSIS - Obstructive Sleep Apnea (G47.33) RECOMMENDATIONS - Trial of CPAP therapy on 10 cm H2O with a Extra Small size Fisher&Paykel Full Face Evora Full mask and heated   humidification. - Avoid alcohol, sedatives and other CNS depressants that may worsen sleep apnea and disrupt normal sleep architecture. - Sleep hygiene should be reviewed to assess factors that may improve sleep quality. - Weight management and regular exercise should be initiated or continued. - Return to Sleep Center for re-evaluation after 4 weeks of therapy  [Electronically signed] 01/11/2022 10:06 AM  Armanda Magic MD, ABSM Diplomate, American Board of Sleep Medicine

## 2022-01-14 ENCOUNTER — Telehealth: Payer: Self-pay | Admitting: *Deleted

## 2022-01-14 DIAGNOSIS — G4733 Obstructive sleep apnea (adult) (pediatric): Secondary | ICD-10-CM

## 2022-01-14 DIAGNOSIS — R002 Palpitations: Secondary | ICD-10-CM

## 2022-01-14 NOTE — Telephone Encounter (Signed)
-----   Message from Gaynelle Cage, CMA sent at 01/14/2022  8:57 AM EDT -----  ----- Message ----- From: Quintella Reichert, MD Sent: 01/11/2022  10:07 AM EDT To: Cv Div Sleep Studies  Please let patient know that they had a successful PAP titration and let DME know that orders are in EPIC.  Please set up 6 week OV with me.

## 2022-01-14 NOTE — Telephone Encounter (Signed)
The patient has been notified of the result and verbalized understanding.  All questions (if any) were answered. Latrelle Dodrill, CMA 01/14/2022 5:29 PM    Upon patient request DME selection is [facility]. Patient understands he will be contacted by [facility] to set up his cpap. Patient understands to call if [facility] does not contact him with new setup in a timely manner. Patient understands they will be called once confirmation has been received from [facility] that they have received their new machine to schedule 10 week follow up appointment.   [facility] notified of new cpap order  Please add to airview Patient was grateful for the call and thanked me

## 2022-01-24 NOTE — Telephone Encounter (Signed)
DME needs a second dx code or a epworth sleepiness score. Reached out to the patient and got a epworth sleepiness score completed and faxed to [facility] at [phone].

## 2022-01-29 IMAGING — US US PELVIS COMPLETE TRANSABD/TRANSVAG W DUPLEX
1 series · 13 of 25 positions shown · non-contrast
Comparison: 03/30/2018 from [HOSPITAL] [facility]

CLINICAL DATA: Pelvic pain. History of polycystic ovarian disease.
Spontaneous abortion 1 week ago.

EXAM:
TRANSABDOMINAL AND TRANSVAGINAL ULTRASOUND OF PELVIS
DOPPLER ULTRASOUND OF OVARIES
TECHNIQUE: Both transabdominal and transvaginal ultrasound examinations of the
pelvis were performed. Transabdominal technique was performed for
global imaging of the pelvis including uterus, ovaries, adnexal
regions, and pelvic cul-de-sac.
It was necessary to proceed with endovaginal exam following the
transabdominal exam to visualize the uterus, ovaries, and adnexa.
Color and duplex Doppler ultrasound was utilized to evaluate blood
flow to the ovaries.

[Series 1: us pelvic complete w transvaginal and torsion righ · 13 of 63 slices shown]
[im 1/63]
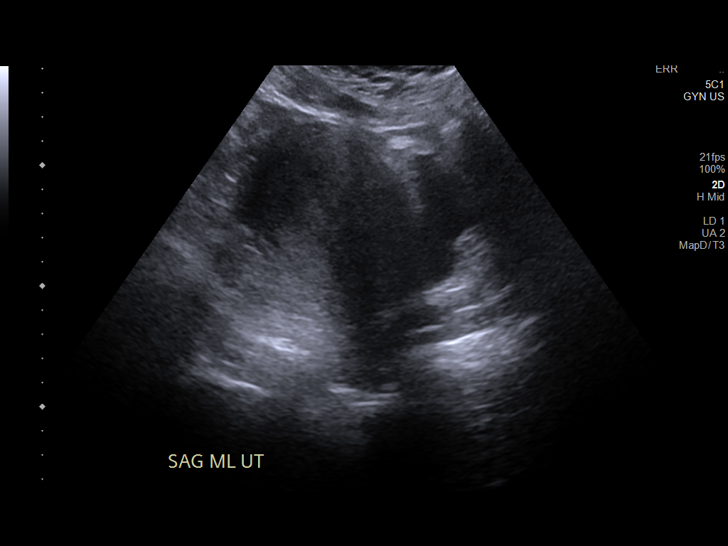
[im 6/63]
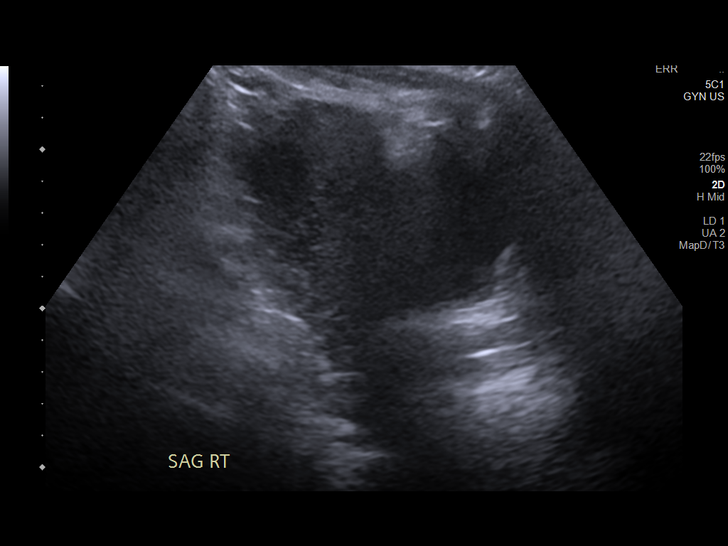
[im 11/63]
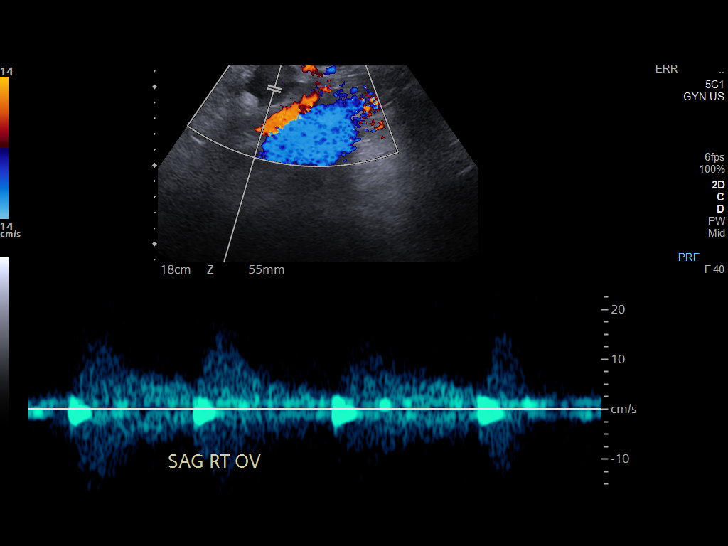
[im 16/63]
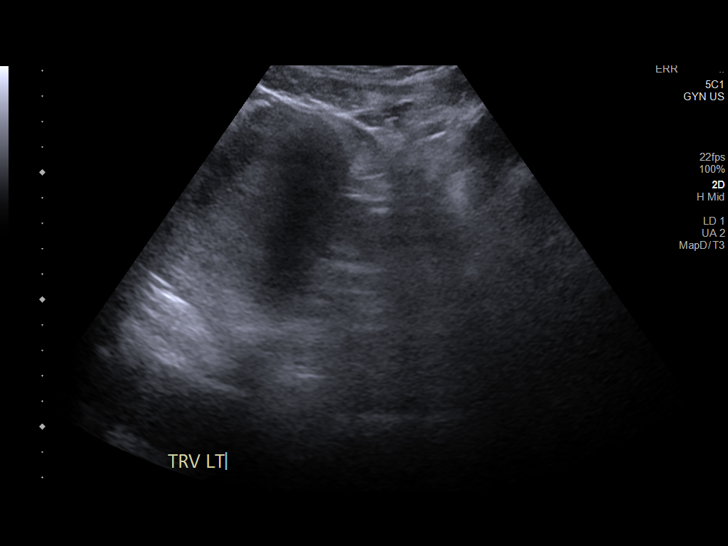
[im 21/63]
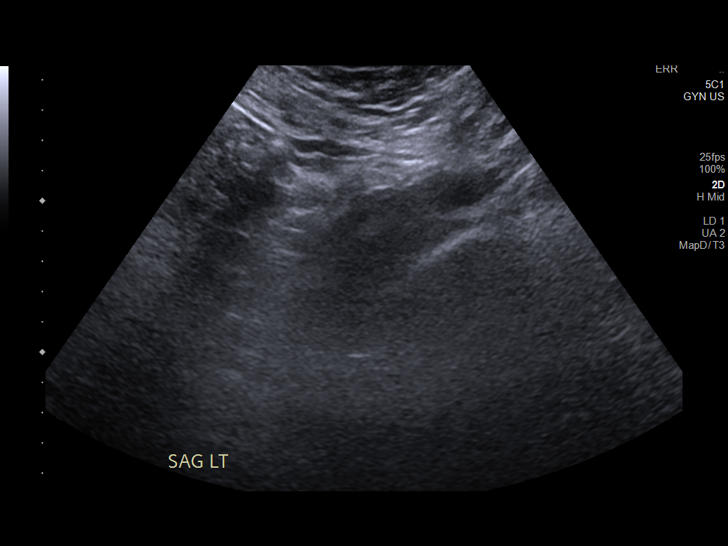
[im 26/63]
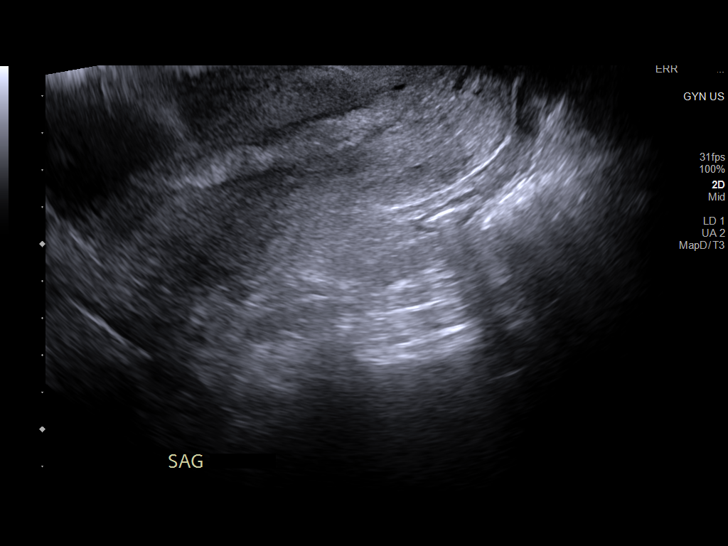
[im 32/63]
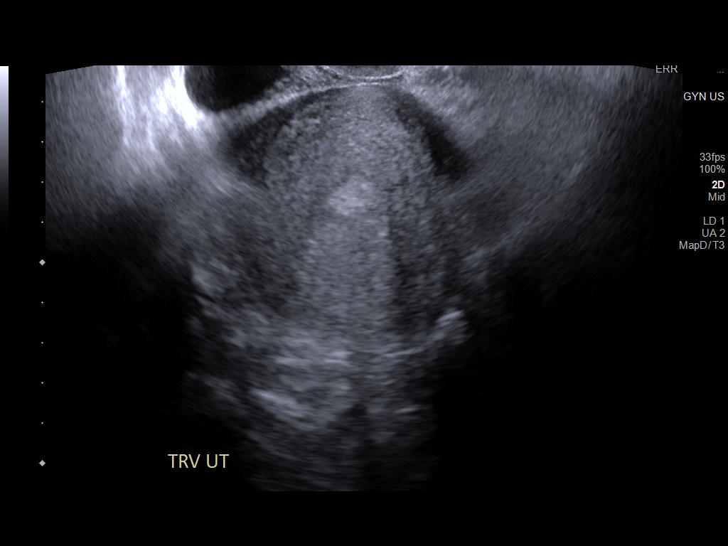
[im 37/63]
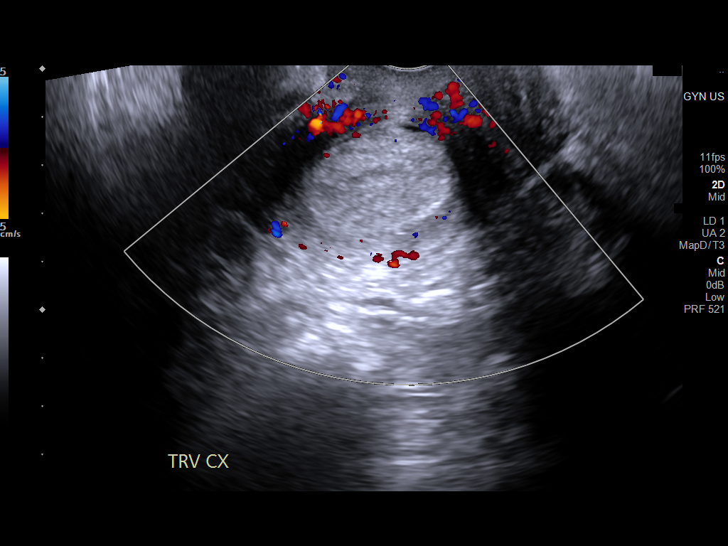
[im 42/63]
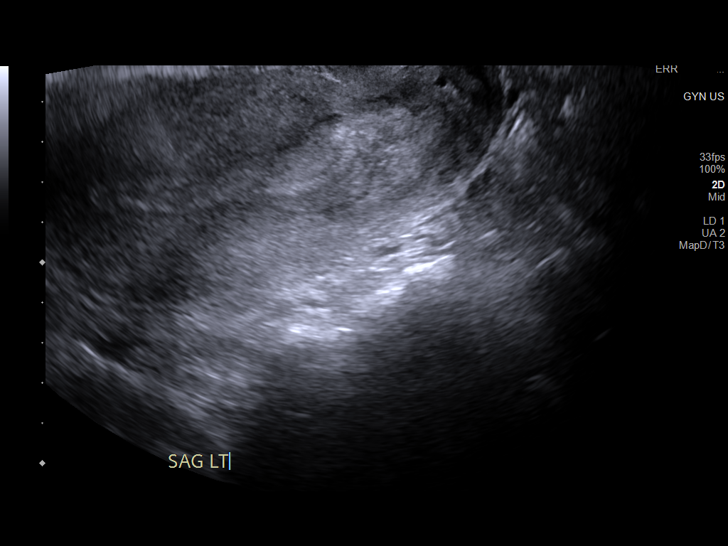
[im 47/63]
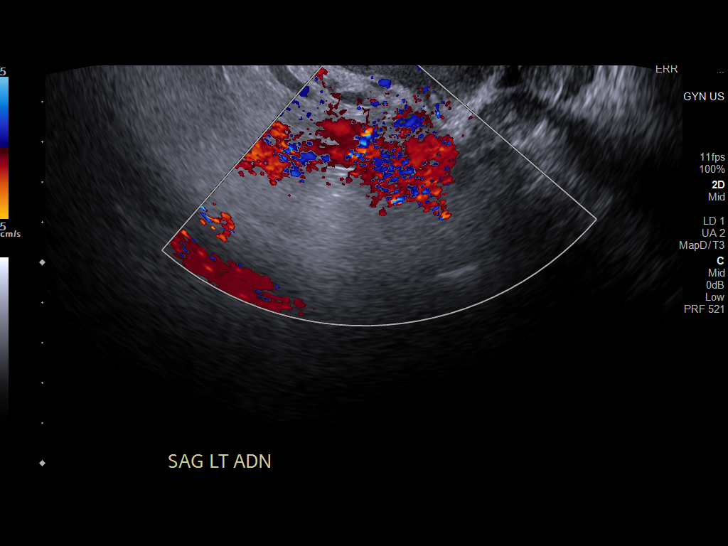
[im 52/63]
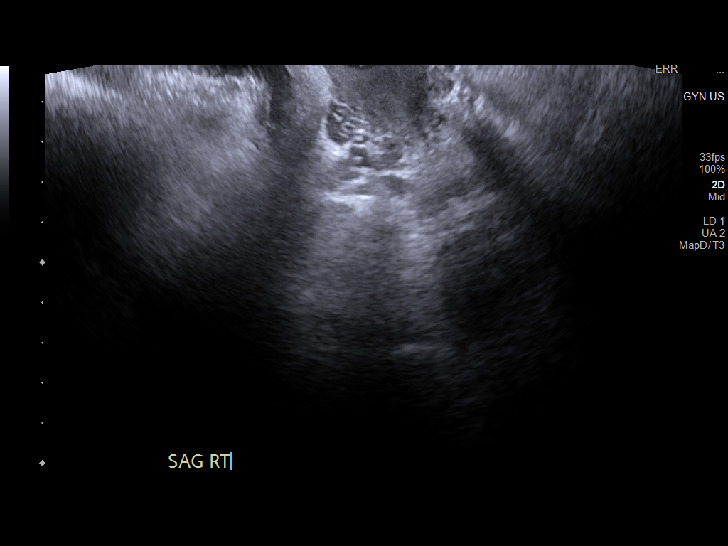
[im 57/63]
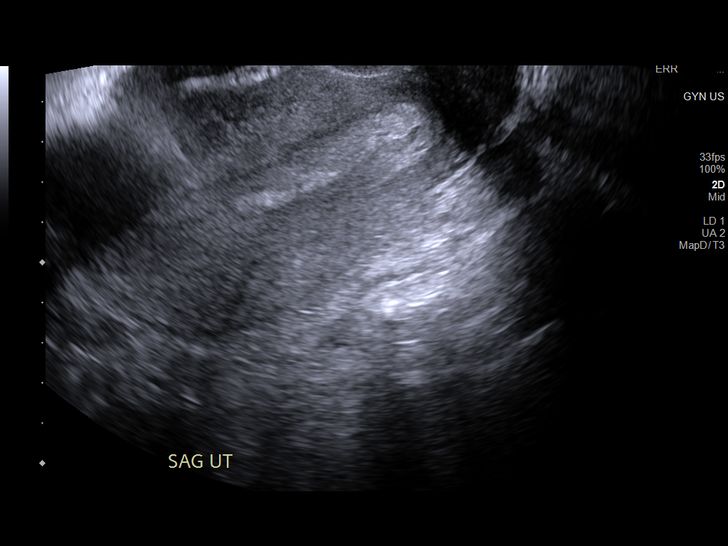
[im 63/63]
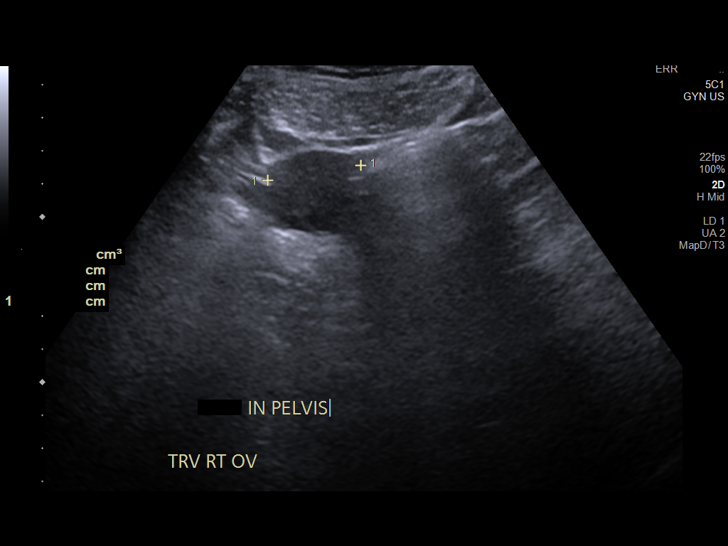

[13 of 25 positions shown; findings below may reference images not displayed]

FINDINGS: Uterus

Measurements: 13.0 x 6.9 by 5.6 cm = volume: 266 mL. No fibroids or
other mass visualized.

Endometrium

Thickness: 13 mm. Focal endometrial thickening within the lower
uterine segment/cervix of up to 2.2 cm, including on image 25.
Suggestion of vascularity on image 26.

Right ovary

Measurements: 4.3 x 3.0 x 2.9 cm = volume: 19 mL. Positioned high
within the right pelvis. Only visualized transabdominally.

Left ovary

Not visualized

Pulsed Doppler evaluation of the right ovary demonstrates normal
color signal and arterial/venous spectral tracings.

Other findings

No abnormal free fluid.

Exam degraded by patient body habitus.
IMPRESSION: 1. Focal endometrial thickening within the lower uterine segment and
cervix. Cannot exclude retained products of conception, given
suggestion of vascularity within.
2. Lack of visualization of the left ovary.

## 2022-04-03 DIAGNOSIS — I4891 Unspecified atrial fibrillation: Secondary | ICD-10-CM | POA: Diagnosis not present

## 2022-04-03 DIAGNOSIS — Z6841 Body Mass Index (BMI) 40.0 and over, adult: Secondary | ICD-10-CM | POA: Diagnosis not present

## 2022-04-03 DIAGNOSIS — Z789 Other specified health status: Secondary | ICD-10-CM | POA: Diagnosis not present

## 2022-04-03 DIAGNOSIS — Z299 Encounter for prophylactic measures, unspecified: Secondary | ICD-10-CM | POA: Diagnosis not present

## 2022-04-03 DIAGNOSIS — R609 Edema, unspecified: Secondary | ICD-10-CM | POA: Diagnosis not present

## 2022-04-06 ENCOUNTER — Emergency Department (HOSPITAL_COMMUNITY)
Admission: EM | Admit: 2022-04-06 | Discharge: 2022-04-06 | Disposition: A | Discharge status: Home / Self Care | Payer: BC Managed Care – PPO | Attending: Emergency Medicine | Admitting: Emergency Medicine

## 2022-04-06 DIAGNOSIS — Z87891 Personal history of nicotine dependence: Secondary | ICD-10-CM | POA: Insufficient documentation

## 2022-04-06 DIAGNOSIS — K0889 Other specified disorders of teeth and supporting structures: Secondary | ICD-10-CM | POA: Diagnosis not present

## 2022-04-06 MED ORDER — CEFDINIR 300 MG PO CAPS: 300.0000 mg | ORAL_CAPSULE | Freq: Two times a day (BID) | ORAL | 0 refills | Status: AC

## 2022-04-06 MED ORDER — OXYCODONE HCL 5 MG PO TABS: 5.0000 mg | ORAL_TABLET | ORAL | 0 refills | Status: DC | PRN

## 2022-04-06 MED ORDER — BUPIVACAINE HCL (PF) 0.5 % IJ SOLN
30.0000 mL | Freq: Once | INTRAMUSCULAR | Status: AC
  Administered 2022-04-06: 30 mL
  Filled 2022-04-06: qty 30

## 2022-04-06 NOTE — ED Triage Notes (Signed)
C/O left lower dental going on for 3 weeks. Went to see dentist 9/13 or 9/14 and was given 7 day course of antibiotics. Pt has completed course and has been taking tylenol & ibuprofen as needed for pain. Pain is 10/10. Pain radiating to left ear.

## 2022-04-06 NOTE — ED Provider Notes (Signed)
[facility]-EMERGENCY DEPT [facility] Emergency Department Provider Note MRN:  [id number]  Arrival date & time: 04/06/22     Chief Complaint   Dental Pain   History of Present Illness   Deborah Riddle is a 36 y.o. year-old female with no pertinent past medical history presenting to the ED with chief complaint of dental pain.  Pain to tooth #21 over the past few days.  It was hurting a few weeks ago and she was put on antibiotics and got better.  But now it is worse again.  There is unknown or obvious cavity to the tooth.  She is scheduled for root canal.  No fever.  Review of Systems  A thorough review of systems was obtained and all systems are negative except as noted in the HPI and PMH.   Patient's Health History    Past Medical History:  Diagnosis Date   Body mass index 40.0-44.9, adult (HCC)    Chest pain, unspecified    Dysrhythmia    AFib   GERD (gastroesophageal reflux disease)    Morbid obesity (HCC)    Paroxysmal atrial fibrillation (HCC)    Polycystic disease, ovaries    Sickle cell trait (HCC)    Tobacco use disorder     Past Surgical History:  Procedure Laterality Date   BIOPSY  08/07/2018   Procedure: BIOPSY;  Surgeon: West Bali, MD;  Location: [facility] ENDO SU[facility];  Service: Endoscopy;;  gastric bx's   DILATION AND EVACUATION  07/07/2020   Procedure: DILATATION AND EVACUATION;  Surgeon: Maxie Better, MD;  Location: [facility] OR;  Service: Gynecology;;   ESOPHAGOGASTRODUODENOSCOPY N/A 08/07/2018   Procedure: ESOPHAGOGASTRODUODENOSCOPY (EGD);  Surgeon: West Bali, MD;  Location: [facility] ENDO SUITE;  Service: Endoscopy;  Laterality: N/A;  3:00pm   HYDRADENITIS EXCISION Right 05/29/2018   Procedure: EXCISION HIDRADENITIS AXILLA;  Surgeon: Lucretia Roers, MD;  Location: [facility] ORS;  Service: General;  Laterality: Right;   MALONEY DILATION N/A 08/07/2018   Procedure: Alvy Beal;  Surgeon: West Bali, MD;  Location: [facility] ENDO SUITE;  Service: Endoscopy;   Laterality: N/A;   none     SAVORY DILATION N/A 08/07/2018   Procedure: SAVORY DILATION;  Surgeon: West Bali, MD;  Location: [facility] ENDO SU[facility];  Service: Endoscopy;  Laterality: N/A;   WISDOM TOOTH EXTRACTION      Family History  Problem Relation Age of Onset   Arrhythmia Mother 44       History of Cardiac Stents   Hypertension Mother    Diabetes Mother    COPD Mother    Colon polyps Mother    Hypertension Father    Diabetes Other        Family History   Hypertension Other        Family History   Diabetes Sister    Hypertension Sister    Colon cancer Maternal Uncle    Crohn's disease Cousin     Social History   Socioeconomic History   Marital status: Married    Spouse name: CHRISTOPHER    Number of children: Not on file   Years of education: Not on file   Highest education level: Not on file  Occupational History   Occupation: PHLEBOTOMIST    Employer: [employer]  Tobacco Use   Smoking status: Former    Packs/day: 1.00    Years: 12.00    Total pack years: 12.00    Types: Cigarettes    Start date: 01/21/2004    Quit  date: 09/10/2011    Years since quitting: 10.5   Smokeless tobacco: Never  Vaping Use   Vaping Use: Never used  Substance and Sexual Activity   Alcohol use: Never    Alcohol/week: 0.0 standard drinks of alcohol   Drug use: No   Sexual activity: Yes  Other Topics Concern   Not on file  Social History Narrative   Lives in [place], [place], [place] with husband.  Works at [employer] as a Charity fundraiser. [place]: 36 YO.   Social Determinants of Health   Financial Resource Strain: Not on file  Food Insecurity: Not on file  Transportation Needs: Not on file  Physical Activity: Not on file  Stress: Not on file  Social Connections: Not on file  Intimate Partner Violence: Not on file     Physical Exam   Vitals:   04/06/22 0220 04/06/22 0226  BP: (!) 149/114 (!) 162/108  Pulse: 70 65  Resp: 18   Temp: 98.1 F (36.7 C)  98 F (36.7 C)  SpO2: 100% 99%    CONSTITUTIONAL: Well-appearing, NAD NEURO/PSYCH:  Alert and oriented x 3, no focal deficits EYES:  eyes equal and reactive ENT/NECK:  no LAD, no JVD CARDIO: Regular rate, well-perfused, normal S1 and S2 PULM:  CTAB no wheezing or rhonchi GI/GU:  non-distended, non-tender MSK/SPINE:  No gross deformities, no edema SKIN:  no rash, atraumatic   *Additional and/or pertinent findings included in MDM below  Diagnostic and Interventional Summary    EKG Interpretation  Date/Time:    Ventricular Rate:    PR Interval:    QRS Duration:   QT Interval:    QTC Calculation:   R Axis:     Text Interpretation:         Labs Reviewed - No data to display  No orders to display    Medications  bupivacaine(PF) (MARCAINE) 0.5 % injection 30 mL (has no administration in time range)     Procedures  /  Critical Care .Nerve Block  Date/Time: 04/06/2022 3:12 AM  Performed by: Maudie Flakes, MD Authorized by: Maudie Flakes, MD   Consent:    Consent obtained:  Verbal   Consent given by:  Patient   Risks, benefits, and alternatives were discussed: yes     Risks discussed:  Allergic reaction, bleeding, intravenous injection, infection, nerve damage, pain, unsuccessful block and swelling   Alternatives discussed:  No treatment Universal protocol:    Procedure explained and questions answered to patient or proxy's satisfaction: yes     Immediately prior to procedure, a time out was called: yes     Patient identity confirmed:  Verbally with patient Indications:    Indications:  Pain relief Location:    Nerve block body site: Dental block, tooth #21.   Laterality:  Left Procedure details:    Block needle gauge:  25 G   Anesthetic injected:  Bupivacaine 0.5% w/o epi   Injection procedure:  Anatomic landmarks identified, anatomic landmarks palpated and incremental injection   Paresthesia:  None Post-procedure details:    Dressing:  None   Outcome:   Pain relieved   Procedure completion:  Tolerated well, no immediate complications Comments:     Periapical nerve block   ED Course and Medical Decision Making  Initial Impression and Ddx The tooth in question is tender to palpation, there is a large cratered cavity.  There is no surrounding abscess, no systemic symptoms.  We will proceed with a dental  block, will trial a different antibiotic and refer back to dentistry.  Past medical/surgical history that increases complexity of ED encounter: None  Interpretation of Diagnostics Laboratory and/or imaging options to aid in the diagnosis/care of the patient were considered.  After careful history and physical examination, it was determined that there was no indication for diagnostics at this time.  Patient Reassessment and Ultimate Disposition/Management     Discharge  Patient management required discussion with the following services or consulting groups:  None  Complexity of Problems Addressed Acute complicated illness or Injury  Additional Data Reviewed and Analyzed Further history obtained from: None  Additional Factors Impacting ED Encounter Risk Prescriptions and Minor Procedures  Barth Kirks. [place]onia Small, [place] [email or web address]  Final Clinical Impressions(s) / ED Diagnoses     ICD-10-CM   1. Pain, dental  K08.89       ED Discharge Orders          Ordered    cefdinir (OMNICEF) 300 MG capsule  2 times daily        04/06/22 0311             Discharge Instructions Discussed with and Provided to Patient:     Discharge Instructions      You were evaluated in the Emergency Department and after careful evaluation, we did not find any emergent condition requiring admission or further testing in the hospital.  Your exam/testing today is overall reassuring.  Symptoms may be due to a lingering tooth infection or a painful cavity.  Take the cefdinir antibiotic as  directed and follow-up closely with your dentist.  Continue Tylenol or Motrin at home for discomfort.  Please return to the Emergency Department if you experience any worsening of your condition.   Thank you for allowing [place] to be a part of your care.       Maudie Flakes, MD 04/06/22 [phone]

## 2022-04-06 NOTE — Discharge Instructions (Signed)
You were evaluated in the Emergency Department and after careful evaluation, we did not find any emergent condition requiring admission or further testing in the hospital.  Your exam/testing today is overall reassuring.  Symptoms may be due to a lingering tooth infection or a painful cavity.  Take the cefdinir antibiotic as directed and follow-up closely with your dentist.  Continue Tylenol or Motrin at home for discomfort.  Please return to the Emergency Department if you experience any worsening of your condition.   Thank you for allowing [place] to be a part of your care.

## 2022-05-29 DIAGNOSIS — N970 Female infertility associated with anovulation: Secondary | ICD-10-CM | POA: Diagnosis not present

## 2022-05-29 DIAGNOSIS — Z31430 Encounter of female for testing for genetic disease carrier status for procreative management: Secondary | ICD-10-CM | POA: Diagnosis not present

## 2022-05-29 DIAGNOSIS — N912 Amenorrhea, unspecified: Secondary | ICD-10-CM | POA: Diagnosis not present

## 2022-05-29 DIAGNOSIS — Z319 Encounter for procreative management, unspecified: Secondary | ICD-10-CM | POA: Diagnosis not present

## 2022-06-04 DIAGNOSIS — Z319 Encounter for procreative management, unspecified: Secondary | ICD-10-CM | POA: Diagnosis not present

## 2022-06-13 DIAGNOSIS — N970 Female infertility associated with anovulation: Secondary | ICD-10-CM | POA: Diagnosis not present

## 2022-06-13 DIAGNOSIS — Z31430 Encounter of female for testing for genetic disease carrier status for procreative management: Secondary | ICD-10-CM | POA: Diagnosis not present

## 2022-06-13 DIAGNOSIS — Z319 Encounter for procreative management, unspecified: Secondary | ICD-10-CM | POA: Diagnosis not present

## 2022-06-13 DIAGNOSIS — Z3169 Encounter for other general counseling and advice on procreation: Secondary | ICD-10-CM | POA: Diagnosis not present

## 2022-06-13 DIAGNOSIS — N912 Amenorrhea, unspecified: Secondary | ICD-10-CM | POA: Diagnosis not present

## 2022-06-13 DIAGNOSIS — E282 Polycystic ovarian syndrome: Secondary | ICD-10-CM | POA: Diagnosis not present

## 2022-06-20 DIAGNOSIS — E288 Other ovarian dysfunction: Secondary | ICD-10-CM | POA: Diagnosis not present

## 2022-06-20 DIAGNOSIS — N96 Recurrent pregnancy loss: Secondary | ICD-10-CM | POA: Diagnosis not present

## 2022-06-20 DIAGNOSIS — Z31430 Encounter of female for testing for genetic disease carrier status for procreative management: Secondary | ICD-10-CM | POA: Diagnosis not present

## 2022-06-20 DIAGNOSIS — Z319 Encounter for procreative management, unspecified: Secondary | ICD-10-CM | POA: Diagnosis not present

## 2022-06-20 DIAGNOSIS — N912 Amenorrhea, unspecified: Secondary | ICD-10-CM | POA: Diagnosis not present

## 2022-06-20 DIAGNOSIS — Z3169 Encounter for other general counseling and advice on procreation: Secondary | ICD-10-CM | POA: Diagnosis not present

## 2022-06-20 DIAGNOSIS — N970 Female infertility associated with anovulation: Secondary | ICD-10-CM | POA: Diagnosis not present

## 2022-06-20 DIAGNOSIS — E282 Polycystic ovarian syndrome: Secondary | ICD-10-CM | POA: Diagnosis not present

## 2022-06-24 DIAGNOSIS — Z3169 Encounter for other general counseling and advice on procreation: Secondary | ICD-10-CM | POA: Diagnosis not present

## 2022-07-28 DIAGNOSIS — G4733 Obstructive sleep apnea (adult) (pediatric): Secondary | ICD-10-CM | POA: Diagnosis not present

## 2022-08-15 DIAGNOSIS — G4733 Obstructive sleep apnea (adult) (pediatric): Secondary | ICD-10-CM | POA: Diagnosis not present

## 2022-08-23 ENCOUNTER — Ambulatory Visit: Payer: BC Managed Care – PPO | Admitting: Family Medicine

## 2022-08-28 DIAGNOSIS — G4733 Obstructive sleep apnea (adult) (pediatric): Secondary | ICD-10-CM | POA: Diagnosis not present

## 2022-09-13 ENCOUNTER — Ambulatory Visit: Payer: BC Managed Care – PPO | Admitting: Family Medicine

## 2022-09-16 DIAGNOSIS — Z113 Encounter for screening for infections with a predominantly sexual mode of transmission: Secondary | ICD-10-CM | POA: Diagnosis not present

## 2022-09-16 DIAGNOSIS — Z1159 Encounter for screening for other viral diseases: Secondary | ICD-10-CM | POA: Diagnosis not present

## 2022-09-16 DIAGNOSIS — Z114 Encounter for screening for human immunodeficiency virus [HIV]: Secondary | ICD-10-CM | POA: Diagnosis not present

## 2022-09-16 DIAGNOSIS — Z01419 Encounter for gynecological examination (general) (routine) without abnormal findings: Secondary | ICD-10-CM | POA: Diagnosis not present

## 2022-09-26 DIAGNOSIS — G4733 Obstructive sleep apnea (adult) (pediatric): Secondary | ICD-10-CM | POA: Diagnosis not present

## 2022-10-07 ENCOUNTER — Ambulatory Visit: Payer: BC Managed Care – PPO | Admitting: Family Medicine

## 2022-10-07 ENCOUNTER — Encounter: Payer: Self-pay | Admitting: Family Medicine

## 2022-10-07 VITALS — BP 132/86 | HR 88 | Ht 69.0 in | Wt 309.0 lb

## 2022-10-07 DIAGNOSIS — E039 Hypothyroidism, unspecified: Secondary | ICD-10-CM

## 2022-10-07 DIAGNOSIS — R7301 Impaired fasting glucose: Secondary | ICD-10-CM

## 2022-10-07 DIAGNOSIS — Z114 Encounter for screening for human immunodeficiency virus [HIV]: Secondary | ICD-10-CM | POA: Diagnosis not present

## 2022-10-07 DIAGNOSIS — Z1159 Encounter for screening for other viral diseases: Secondary | ICD-10-CM | POA: Diagnosis not present

## 2022-10-07 DIAGNOSIS — Z0001 Encounter for general adult medical examination with abnormal findings: Secondary | ICD-10-CM

## 2022-10-07 DIAGNOSIS — Z872 Personal history of diseases of the skin and subcutaneous tissue: Secondary | ICD-10-CM

## 2022-10-07 DIAGNOSIS — E785 Hyperlipidemia, unspecified: Secondary | ICD-10-CM | POA: Diagnosis not present

## 2022-10-07 MED ORDER — DESONIDE 0.05 % EX CREA: TOPICAL_CREAM | Freq: Two times a day (BID) | CUTANEOUS | 0 refills | Status: DC

## 2022-10-07 NOTE — Patient Instructions (Addendum)
It was pleasure meeting with you today. Will keep you updated on lab results on MyChart Follow up with your primary health provider if any health concerns arises.

## 2022-10-07 NOTE — Assessment & Plan Note (Signed)
Physical exam done, labs ordered  Updated screening and health maintenance  Exercise and nutrition counseling BMI  45.63 Advise lifestyle modifications follow diet low in saturated fat, reduce dietary salt intake, avoid fatty foods, maintain an exercise routine 3 to 5 days a week for a minimum total of 150 minutes.

## 2022-10-07 NOTE — Progress Notes (Deleted)
New Patient Office Visit   Subjective   Patient ID: Deborah Riddle, female    DOB: 13-Sep-1985  Age: 37 y.o. MRN: [id number]  CC:  Chief Complaint  Patient presents with   Establish Care   Rash    Patient complains of dry skin on her hands.     HPI Deborah Riddle presents to establish care. She  has a past medical history of Body mass index 40.0-44.9, adult ([place]), Chest pain, unspecified, Dysrhythmia, GERD (gastroesophageal reflux disease), Morbid obesity ([place]), Paroxysmal atrial fibrillation ([place]), Polycystic disease, ovaries, Sickle cell trait ([place]), and Tobacco use disorder.  Use skin moisturizers such as Cetaphil, Eucerin and Lubriderm or bath additives such as Aveeno or AlphaKeri. Avoid excessive soap and water which may dry the skin.    ***  Outpatient Encounter Medications as of 10/07/2022  Medication Sig   loratadine (CLARITIN) 10 MG tablet Take 10 mg by mouth daily.   metFORMIN (GLUCOPHAGE) 1000 MG tablet Take by mouth.   metFORMIN (GLUCOPHAGE) 500 MG tablet Take 500 mg by mouth 2 (two) times daily with a meal.   propranolol (INDERAL) 10 MG tablet Take 1 tablet (10 mg total) by mouth 4 (four) times daily as needed (palpitations).   Vitamin D, Ergocalciferol, (DRISDOL) 50000 UNITS CAPS capsule Take 50,000 Units by mouth every Saturday.   diltiazem (TIAZAC) 120 MG 24 hr capsule Take 1 capsule (120 mg total) by mouth daily.   ibuprofen (ADVIL) 800 MG tablet Take 800 mg by mouth every 8 (eight) hours as needed.   Inositol-D Chiro-Inositol (OVASITOL) 2000-50 MG PACK See admin instructions.   medroxyPROGESTERone Acetate (PROVERA PO) Take by mouth.   oxyCODONE (ROXICODONE) 5 MG immediate release tablet Take 1 tablet (5 mg total) by mouth every 4 (four) hours as needed for severe pain.   No facility-administered encounter medications on file as of 10/07/2022.    Past Surgical History:  Procedure Laterality Date   BIOPSY  08/07/2018   Procedure: BIOPSY;  Surgeon:  Danie Binder, MD;  [facility];  Service: Endoscopy;;  gastric bx's   DILATION AND EVACUATION  07/07/2020   Procedure: DILATATION AND EVACUATION;  Surgeon: Servando Salina, MD;  Location: [facility];  Service: Gynecology;;   ESOPHAGOGASTRODUODENOSCOPY N/A 08/07/2018   Procedure: ESOPHAGOGASTRODUODENOSCOPY (EGD);  Surgeon: Danie Binder, MD;  [facility];  Service: Endoscopy;  Laterality: N/A;  3:00pm   HYDRADENITIS EXCISION Right 05/29/2018   Procedure: EXCISION HIDRADENITIS AXILLA;  Surgeon: Virl Cagey, MD;  Location: [facility];  Service: General;  Laterality: Right;   MALONEY DILATION N/A 08/07/2018   Procedure: Keturah Shavers;  Surgeon: Danie Binder, MD;  [facility];  Service: Endoscopy;  Laterality: N/A;   none     SAVORY DILATION N/A 08/07/2018   Procedure: SAVORY DILATION;  Surgeon: Danie Binder, MD;  [facility];  Service: Endoscopy;  Laterality: N/A;   WISDOM TOOTH EXTRACTION      ROS    Objective    BP 132/86   Pulse 88   Ht 5\' 9"  (1.753 m)   Wt (!) 309 lb (140.2 kg)   SpO2 94%   BMI 45.63 kg/m   Physical Exam    Assessment & Plan:  Hyperlipidemia, unspecified hyperlipidemia type  Need for hepatitis C screening test  Screening for HIV (human immunodeficiency virus)  Hypothyroidism, unspecified type  IFG (impaired fasting glucose)    No follow-ups on file.   Avel Sensor  Polanco, FNP

## 2022-10-07 NOTE — Assessment & Plan Note (Signed)
Started patient on desonide 0.05% cream Advise lubricate or moisturize the skin two to three times a day.

## 2022-10-07 NOTE — Progress Notes (Signed)
Complete physical exam  Patient: Deborah Riddle   DOB: 01/23/1986   37 y.o. Female  MRN: [id number]  Subjective:    Chief Complaint  Patient presents with   Establish Care   Rash    Patient complains of dry skin on her hands.     Deborah Riddle is a 37 y.o. female who presents today for a complete physical exam. She reports consuming a general diet. The patient does not participate in regular exercise at present. She generally feels well. She reports sleeping fairly well. She does have additional problems to discuss today in regards to forearms discomfort and swelling.   Most recent fall risk assessment:    10/07/2022    8:24 AM  [place] in the past year? 0  Number falls in past yr: 0  Injury with Fall? 0  Risk for fall due to : No Fall Risks  Follow up Falls evaluation completed    .ga Most recent depression screenings:    10/07/2022    8:24 AM  PHQ 2/9 Scores  PHQ - 2 Score 0  PHQ- 9 Score 0    Vision:Not within last year   Patient Care Team: Del Eli Hose, FNP as PCP - General (Family Medicine) Herminio Commons, MD (Inactive) as Attending Physician (Cardiology) Danie Binder, MD (Inactive) as Consulting Physician (Gastroenterology)   Outpatient Medications Prior to Visit  Medication Sig   loratadine (CLARITIN) 10 MG tablet Take 10 mg by mouth daily.   metFORMIN (GLUCOPHAGE) 1000 MG tablet Take by mouth.   metFORMIN (GLUCOPHAGE) 500 MG tablet Take 500 mg by mouth 2 (two) times daily with a meal.   propranolol (INDERAL) 10 MG tablet Take 1 tablet (10 mg total) by mouth 4 (four) times daily as needed (palpitations).   Vitamin D, Ergocalciferol, (DRISDOL) 50000 UNITS CAPS capsule Take 50,000 Units by mouth every Saturday.   diltiazem (TIAZAC) 120 MG 24 hr capsule Take 1 capsule (120 mg total) by mouth daily.   ibuprofen (ADVIL) 800 MG tablet Take 800 mg by mouth every 8 (eight) hours as needed.   Inositol-D Chiro-Inositol  (OVASITOL) 2000-50 MG PACK See admin instructions.   medroxyPROGESTERone Acetate (PROVERA PO) Take by mouth.   oxyCODONE (ROXICODONE) 5 MG immediate release tablet Take 1 tablet (5 mg total) by mouth every 4 (four) hours as needed for severe pain.   No facility-administered medications prior to visit.    Review of Systems  Constitutional:  Negative for chills and fever.  Eyes:  Negative for blurred vision.  Respiratory:  Negative for shortness of breath.   Cardiovascular:  Negative for chest pain.  Gastrointestinal:  Negative for abdominal pain.  Genitourinary:  Negative for flank pain.  Musculoskeletal:  Negative for back pain.  Skin:  Negative for itching.  Neurological:  Negative for dizziness and headaches.  Psychiatric/Behavioral:  Negative for depression. The patient is not nervous/anxious.        Objective:    BP 132/86   Pulse 88   Ht 5\' 9"  (1.753 m)   Wt (!) 309 lb (140.2 kg)   SpO2 94%   BMI 45.63 kg/m  BP Readings from Last 3 Encounters:  10/07/22 132/86  04/06/22 (!) 162/108  10/25/21 116/70      Physical Exam Constitutional:      Appearance: Normal appearance.  HENT:     Head: Normocephalic.     Nose: Nose normal.     Mouth/Throat:  Mouth: Mucous membranes are moist.  Eyes:     Pupils: Pupils are equal, round, and reactive to light.  Cardiovascular:     Rate and Rhythm: Normal rate.     Pulses: Normal pulses.     Heart sounds: Normal heart sounds.  Pulmonary:     Effort: Pulmonary effort is normal.     Breath sounds: Normal breath sounds.  Abdominal:     General: Bowel sounds are normal.     Palpations: Abdomen is soft. There is no mass.     Tenderness: There is no right CVA tenderness, left CVA tenderness or guarding.  Musculoskeletal:        General: Swelling present. Normal range of motion.     Cervical back: Normal range of motion and neck supple.     Comments: Forearms slightly swelling  Skin:    General: Skin is warm and dry.      Capillary Refill: Capillary refill takes less than 2 seconds.  Neurological:     General: No focal deficit present.     Mental Status: She is alert.     Coordination: Coordination normal.     Gait: Gait normal.  Psychiatric:        Mood and Affect: Mood normal.        Thought Content: Thought content normal.      No results found for any visits on 10/07/22.    Assessment & Plan:    Routine Health Maintenance and Physical Exam  Immunization History  Administered Date(s) Administered   PPD Test 02/23/2021   Tdap 06/13/2013    Health Maintenance  Topic Date Due   Hepatitis C Screening  Never done   INFLUENZA VACCINE  10/13/2022 (Originally 02/12/2022)   COVID-19 Vaccine (1) 10/23/2022 (Originally [birth year]1984)   DTaP/Tdap/Td (2 - Td or Tdap) 06/14/2023   PAP SMEAR-Modifier  08/28/2025   HIV Screening  Completed   HPV VACCINES  Aged Out    Discussed health benefits of physical activity, and encouraged her to engage in regular exercise appropriate for her age and condition.  Hx of eczema Assessment & Plan: Started patient on desonide 0.05% cream Advise lubricate or moisturize the skin two to three times a day.   Orders: -     Desonide; Apply topically 2 (two) times daily.  Dispense: 30 g; Refill: 0  Need for hepatitis C screening test -     Hepatitis C antibody  Screening for HIV (human immunodeficiency virus) -     HIV Antibody (routine testing w rflx)  Hypothyroidism, unspecified type -     TSH + free T4  Hyperlipidemia, unspecified hyperlipidemia type -     Lipid panel -     CMP14+EGFR -     CBC with Differential/Platelet  IFG (impaired fasting glucose) -     Hemoglobin A1c  Encounter for routine adult physical exam with abnormal findings Assessment & Plan: Physical exam done, labs ordered  Updated screening and health maintenance  Exercise and nutrition counseling BMI  45.63 Advise lifestyle modifications follow diet low in saturated fat, reduce dietary  salt intake, avoid fatty foods, maintain an exercise routine 3 to 5 days a week for a minimum total of 150 minutes.      No follow-ups on file.     Renard Hamper Ria Comment, FNP

## 2022-10-09 ENCOUNTER — Other Ambulatory Visit: Payer: Self-pay | Admitting: Family Medicine

## 2022-10-09 DIAGNOSIS — Z130 Encounter for screening for diseases of the blood and blood-forming organs and certain disorders involving the immune mechanism: Secondary | ICD-10-CM

## 2022-10-09 LAB — CBC WITH DIFFERENTIAL/PLATELET
Basophils Absolute: 0 10*3/uL (ref 0.0–0.2)
Basos: 1 %
EOS (ABSOLUTE): 0.1 10*3/uL (ref 0.0–0.4)
Eos: 1 %
Hematocrit: 33.4 % — ABNORMAL LOW (ref 34.0–46.6)
Hemoglobin: 10.9 g/dL — ABNORMAL LOW (ref 11.1–15.9)
Immature Grans (Abs): 0 10*3/uL (ref 0.0–0.1)
Immature Granulocytes: 0 %
Lymphocytes Absolute: 2.3 10*3/uL (ref 0.7–3.1)
Lymphs: 31 %
MCH: 22.2 pg — ABNORMAL LOW (ref 26.6–33.0)
MCHC: 32.6 g/dL (ref 31.5–35.7)
MCV: 68 fL — ABNORMAL LOW (ref 79–97)
Monocytes Absolute: 0.4 10*3/uL (ref 0.1–0.9)
Monocytes: 5 %
Neutrophils Absolute: 4.6 10*3/uL (ref 1.4–7.0)
Neutrophils: 62 %
RBC: 4.91 x10E6/uL (ref 3.77–5.28)
RDW: 20.1 % — ABNORMAL HIGH (ref 11.7–15.4)
WBC: 7.5 10*3/uL (ref 3.4–10.8)

## 2022-10-09 LAB — CMP14+EGFR
ALT: 15 IU/L (ref 0–32)
AST: 20 IU/L (ref 0–40)
Albumin/Globulin Ratio: 1.4 (ref 1.2–2.2)
Albumin: 3.9 g/dL (ref 3.9–4.9)
Alkaline Phosphatase: 101 IU/L (ref 44–121)
BUN/Creatinine Ratio: 12 (ref 9–23)
BUN: 11 mg/dL (ref 6–20)
Bilirubin Total: 0.2 mg/dL (ref 0.0–1.2)
CO2: 20 mmol/L (ref 20–29)
Calcium: 9 mg/dL (ref 8.7–10.2)
Chloride: 106 mmol/L (ref 96–106)
Creatinine, Ser: 0.93 mg/dL (ref 0.57–1.00)
Globulin, Total: 2.7 g/dL (ref 1.5–4.5)
Glucose: 85 mg/dL (ref 70–99)
Potassium: 4.3 mmol/L (ref 3.5–5.2)
Sodium: 141 mmol/L (ref 134–144)
Total Protein: 6.6 g/dL (ref 6.0–8.5)
eGFR: 81 mL/min/{1.73_m2} (ref >59)

## 2022-10-09 LAB — TSH+FREE T4
Free T4: 1.1 ng/dL (ref 0.82–1.77)
TSH: 2.07 u[IU]/mL (ref 0.450–4.500)

## 2022-10-09 LAB — LIPID PANEL
Chol/HDL Ratio: 3.6 ratio (ref 0.0–4.4)
Cholesterol, Total: 192 mg/dL (ref 100–199)
HDL: 54 mg/dL (ref >39)
LDL Chol Calc (NIH): 122 mg/dL — ABNORMAL HIGH (ref 0–99)
Triglycerides: 87 mg/dL (ref 0–149)
VLDL Cholesterol Cal: 16 mg/dL (ref 5–40)

## 2022-10-09 LAB — HEMOGLOBIN A1C
Est. average glucose Bld gHb Est-mCnc: 114 mg/dL
Hgb A1c MFr Bld: 5.6 % (ref 4.8–5.6)

## 2022-10-09 LAB — HIV ANTIBODY (ROUTINE TESTING W REFLEX): HIV Screen 4th Generation wRfx: NONREACTIVE

## 2022-10-09 LAB — HEPATITIS C ANTIBODY: Hep C Virus Ab: NONREACTIVE

## 2022-10-11 ENCOUNTER — Ambulatory Visit: Payer: BC Managed Care – PPO | Admitting: Family Medicine

## 2022-10-27 DIAGNOSIS — G4733 Obstructive sleep apnea (adult) (pediatric): Secondary | ICD-10-CM | POA: Diagnosis not present

## 2022-10-30 ENCOUNTER — Telehealth: Payer: Self-pay | Admitting: Family Medicine

## 2022-10-30 NOTE — Telephone Encounter (Signed)
Pt called and is having discomfort when using the restroom. She would like to know if she needs to make an appt or can she come in a do a urine sample. Follow up asap.

## 2022-10-31 NOTE — Telephone Encounter (Signed)
Scheduled

## 2022-11-01 ENCOUNTER — Encounter: Payer: Self-pay | Admitting: Family Medicine

## 2022-11-01 ENCOUNTER — Ambulatory Visit: Payer: BC Managed Care – PPO | Admitting: Family Medicine

## 2022-11-01 VITALS — BP 126/80 | HR 90 | Ht 69.0 in | Wt 314.0 lb

## 2022-11-01 DIAGNOSIS — R109 Unspecified abdominal pain: Secondary | ICD-10-CM | POA: Insufficient documentation

## 2022-11-01 DIAGNOSIS — N898 Other specified noninflammatory disorders of vagina: Secondary | ICD-10-CM

## 2022-11-01 DIAGNOSIS — R399 Unspecified symptoms and signs involving the genitourinary system: Secondary | ICD-10-CM | POA: Diagnosis not present

## 2022-11-01 LAB — POCT URINALYSIS DIP (CLINITEK)
Bilirubin, UA: NEGATIVE
Blood, UA: NEGATIVE
Glucose, UA: NEGATIVE mg/dL
Leukocytes, UA: NEGATIVE
Nitrite, UA: NEGATIVE
POC PROTEIN,UA: NEGATIVE
Spec Grav, UA: 1.02 (ref 1.010–1.025)
Urobilinogen, UA: 0.2 E.U./dL
pH, UA: 7.5 (ref 5.0–8.0)

## 2022-11-01 NOTE — Progress Notes (Signed)
Patient Office Visit   Subjective   Patient ID: Deborah Riddle, female    DOB: 09-08-1985  Age: 37 y.o. MRN: [id number]  CC:  Chief Complaint  Patient presents with   Urinary Tract Infection    Pt reports pelvic pain and low back pain. Also wants to do a swab to check for bv.    Constipation    Pt reports taking Iron and can cause constipation.     HPI Deborah Riddle 37 year old female, presents to clinic for right flank pain. She  has a past medical history of Body mass index 40.0-44.9, adult, Chest pain, unspecified, Dysrhythmia, GERD (gastroesophageal reflux disease), Morbid obesity, Paroxysmal atrial fibrillation, Polycystic disease, ovaries, Sickle cell trait, and Tobacco use disorder.  Right Flank Pain The current episode started 1 to 4 weeks ago. Patient report right flank pain occurs intermittently and has been waxing and waning since onset.The quality of the pain is described as aching. The pain is at a severity of 6/10. The pain is worse during the day. The symptoms are aggravated by twisting. Pertinent negatives include no bladder incontinence, bowel incontinence, dysuria, fever, leg pain, numbness, pelvic pain or perianal numbness. Risk factors include obesity, lack of exercise and sedentary lifestyle. She has tried analgesics for the symptoms. The treatment provided mild relief.      Outpatient Encounter Medications as of 11/01/2022  Medication Sig   desonide (DESOWEN) 0.05 % cream Apply topically 2 (two) times daily.   diltiazem (TIAZAC) 120 MG 24 hr capsule Take 1 capsule (120 mg total) by mouth daily.   ibuprofen (ADVIL) 800 MG tablet Take 800 mg by mouth every 8 (eight) hours as needed.   Inositol-D Chiro-Inositol (OVASITOL) 2000-50 MG PACK See admin instructions.   loratadine (CLARITIN) 10 MG tablet Take 10 mg by mouth daily.   medroxyPROGESTERone Acetate (PROVERA PO) Take by mouth.   metFORMIN (GLUCOPHAGE) 1000 MG tablet Take by mouth.   metFORMIN  (GLUCOPHAGE) 500 MG tablet Take 500 mg by mouth 2 (two) times daily with a meal.   oxyCODONE (ROXICODONE) 5 MG immediate release tablet Take 1 tablet (5 mg total) by mouth every 4 (four) hours as needed for severe pain.   propranolol (INDERAL) 10 MG tablet Take 1 tablet (10 mg total) by mouth 4 (four) times daily as needed (palpitations).   Vitamin D, Ergocalciferol, (DRISDOL) 50000 UNITS CAPS capsule Take 50,000 Units by mouth every Saturday.   No facility-administered encounter medications on file as of 11/01/2022.    Past Surgical History:  Procedure Laterality Date   BIOPSY  08/07/2018   Procedure: BIOPSY;  Surgeon: West Bali, MD;  [facility];  Service: Endoscopy;;  gastric bx's   DILATION AND EVACUATION  07/07/2020   Procedure: DILATATION AND EVACUATION;  Surgeon: Maxie Better, MD;  [facility];  Service: Gynecology;;   ESOPHAGOGASTRODUODENOSCOPY N/A 08/07/2018   Procedure: ESOPHAGOGASTRODUODENOSCOPY (EGD);  Surgeon: West Bali, MD;  [facility];  Service: Endoscopy;  Laterality: N/A;  3:00pm   HYDRADENITIS EXCISION Right 05/29/2018   Procedure: EXCISION HIDRADENITIS AXILLA;  Surgeon: Lucretia Roers, MD;  Location[facility];  Service: General;  Laterality: Right;   MALONEY DILATION N/A 08/07/2018   Procedure: Alvy Beal;  Surgeon: West Bali, MD;  [facility];  Service: Endoscopy;  Laterality: N/A;   none     SAVORY DILATION N/A 08/07/2018   Procedure: SAVORY DILATION;  Surgeon: West Bali, MD;  [facility];  Service: Endoscopy;  Laterality: N/A;   WISDOM TOOTH EXTRACTION      Review of Systems  Constitutional:  Negative for chills and fever.  Respiratory:  Negative for shortness of breath.   Cardiovascular:  Negative for chest pain.  Gastrointestinal:  Negative for abdominal pain.  Genitourinary:  Positive for flank pain. Negative for dysuria, frequency, hematuria and urgency.  Neurological:   Negative for dizziness.      Objective    BP 126/80   Pulse 90   Ht  (1.753 m)   Wt (!) 314 lb 0.6 oz (142.4 kg)   SpO2 95%   BMI 46.38 kg/m   Physical Exam Vitals reviewed.  Constitutional:      General: She is not in acute distress.    Appearance: Normal appearance. She is not ill-appearing, toxic-appearing or diaphoretic.  HENT:     Head: Normocephalic.  Eyes:     General:        Right eye: No discharge.        Left eye: No discharge.     Conjunctiva/sclera: Conjunctivae normal.  Cardiovascular:     Rate and Rhythm: Normal rate.     Pulses: Normal pulses.     Heart sounds: Normal heart sounds.  Pulmonary:     Effort: Pulmonary effort is normal. No respiratory distress.     Breath sounds: Normal breath sounds.  Abdominal:     General: Bowel sounds are normal.     Palpations: Abdomen is soft.     Tenderness: There is no abdominal tenderness. There is no right CVA tenderness, left CVA tenderness or guarding.  Musculoskeletal:        General: Normal range of motion.     Cervical back: Normal range of motion.  Skin:    General: Skin is warm and dry.     Capillary Refill: Capillary refill takes less than 2 seconds.  Neurological:     General: No focal deficit present.     Mental Status: She is alert and oriented to person, place, and time.     Coordination: Coordination normal.     Gait: Gait normal.  Psychiatric:        Mood and Affect: Mood normal.       Assessment & Plan:  Right flank pain Assessment & Plan: Urinalysis, urine culture and US Renal ordered- awaiting results will follow up. Advise patient to increase water intake between 2-3 L a day. Limit intake of caffeine and sugary drinks. Pain management include: Tylenol PRN  Orders: -     US RENAL; Future  UTI symptoms -     POCT URINALYSIS DIP (CLINITEK)  Vaginal irritation -     NuSwab Vaginitis Plus (VG+)    No follow-ups on file.   Cruzita Lederer Newman Nip, FNP

## 2022-11-01 NOTE — Assessment & Plan Note (Addendum)
Urinalysis, urine culture and US Renal ordered- awaiting results will follow up. Advise patient to increase water intake between 2-3 L a day. Limit intake of caffeine and sugary drinks. Pain management include: Tylenol PRN

## 2022-11-01 NOTE — Patient Instructions (Signed)
It was pleasure meeting with you today. Follow up with your primary health provider if any health concerns arises. If symptoms worsen please contact your primary care provider and/or visit the emergency department.  

## 2022-11-02 DIAGNOSIS — Z6841 Body Mass Index (BMI) 40.0 and over, adult: Secondary | ICD-10-CM | POA: Diagnosis not present

## 2022-11-02 DIAGNOSIS — M79672 Pain in left foot: Secondary | ICD-10-CM | POA: Diagnosis not present

## 2022-11-02 DIAGNOSIS — R030 Elevated blood-pressure reading, without diagnosis of hypertension: Secondary | ICD-10-CM | POA: Diagnosis not present

## 2022-11-05 ENCOUNTER — Other Ambulatory Visit: Payer: Self-pay

## 2022-11-05 ENCOUNTER — Emergency Department (HOSPITAL_COMMUNITY): Payer: BC Managed Care – PPO

## 2022-11-05 ENCOUNTER — Encounter (HOSPITAL_COMMUNITY): Payer: Self-pay | Admitting: Emergency Medicine

## 2022-11-05 ENCOUNTER — Emergency Department (HOSPITAL_COMMUNITY)
Admission: EM | Admit: 2022-11-05 | Discharge: 2022-11-05 | Disposition: A | Discharge status: Home / Self Care | Payer: BC Managed Care – PPO | Attending: Emergency Medicine | Admitting: Emergency Medicine

## 2022-11-05 DIAGNOSIS — I4891 Unspecified atrial fibrillation: Secondary | ICD-10-CM | POA: Diagnosis not present

## 2022-11-05 DIAGNOSIS — R002 Palpitations: Secondary | ICD-10-CM | POA: Diagnosis not present

## 2022-11-05 DIAGNOSIS — R350 Frequency of micturition: Secondary | ICD-10-CM | POA: Diagnosis not present

## 2022-11-05 DIAGNOSIS — Z7984 Long term (current) use of oral hypoglycemic drugs: Secondary | ICD-10-CM | POA: Diagnosis not present

## 2022-11-05 LAB — CBC WITH DIFFERENTIAL/PLATELET
Abs Immature Granulocytes: 0.05 10*3/uL (ref 0.00–0.07)
Basophils Absolute: 0.1 10*3/uL (ref 0.0–0.1)
Basophils Relative: 1 %
Eosinophils Absolute: 0.1 10*3/uL (ref 0.0–0.5)
Eosinophils Relative: 1 %
HCT: 38.3 % (ref 36.0–46.0)
Hemoglobin: 12.3 g/dL (ref 12.0–15.0)
Immature Granulocytes: 1 %
Lymphocytes Relative: 31 %
Lymphs Abs: 2.9 10*3/uL (ref 0.7–4.0)
MCH: 21.7 pg — ABNORMAL LOW (ref 26.0–34.0)
MCHC: 32.1 g/dL (ref 30.0–36.0)
MCV: 67.4 fL — ABNORMAL LOW (ref 80.0–100.0)
Monocytes Absolute: 0.6 10*3/uL (ref 0.1–1.0)
Monocytes Relative: 6 %
Neutro Abs: 5.7 10*3/uL (ref 1.7–7.7)
Neutrophils Relative %: 60 %
Platelets: ADEQUATE 10*3/uL (ref 150–400)
RBC: 5.68 MIL/uL — ABNORMAL HIGH (ref 3.87–5.11)
RDW: 21.5 % — ABNORMAL HIGH (ref 11.5–15.5)
Smear Review: ADEQUATE
WBC: 9.3 10*3/uL (ref 4.0–10.5)
nRBC: 0 % (ref 0.0–0.2)

## 2022-11-05 LAB — BASIC METABOLIC PANEL
Anion gap: 8 (ref 5–15)
BUN: 12 mg/dL (ref 6–20)
CO2: 25 mmol/L (ref 22–32)
Calcium: 8.6 mg/dL — ABNORMAL LOW (ref 8.9–10.3)
Chloride: 105 mmol/L (ref 98–111)
Creatinine, Ser: 0.9 mg/dL (ref 0.44–1.00)
GFR, Estimated: >60 mL/min (ref >60)
Glucose, Bld: 111 mg/dL — ABNORMAL HIGH (ref 70–99)
Potassium: 3.7 mmol/L (ref 3.5–5.1)
Sodium: 138 mmol/L (ref 135–145)

## 2022-11-05 LAB — URINALYSIS, W/ REFLEX TO CULTURE (INFECTION SUSPECTED)
Bilirubin Urine: NEGATIVE
Glucose, UA: NEGATIVE mg/dL
Ketones, ur: NEGATIVE mg/dL
Leukocytes,Ua: NEGATIVE
Nitrite: NEGATIVE
Protein, ur: NEGATIVE mg/dL
Specific Gravity, Urine: 1.004 — ABNORMAL LOW (ref 1.005–1.030)
pH: 6 (ref 5.0–8.0)

## 2022-11-05 LAB — TROPONIN I (HIGH SENSITIVITY)
Troponin I (High Sensitivity): 4 ng/L (ref <18)
Troponin I (High Sensitivity): 8 ng/L (ref <18)

## 2022-11-05 LAB — NUSWAB VAGINITIS PLUS (VG+)
Candida albicans, NAA: NEGATIVE
Candida glabrata, NAA: NEGATIVE
Chlamydia trachomatis, NAA: NEGATIVE
Neisseria gonorrhoeae, NAA: NEGATIVE
Trich vag by NAA: NEGATIVE

## 2022-11-05 LAB — PREGNANCY, URINE: Preg Test, Ur: NEGATIVE

## 2022-11-05 LAB — MAGNESIUM: Magnesium: 2 mg/dL (ref 1.7–2.4)

## 2022-11-05 MED ORDER — DILTIAZEM HCL ER COATED BEADS 180 MG PO CP24
180.0000 mg | ORAL_CAPSULE | Freq: Once | ORAL | Status: AC
  Administered 2022-11-05: 180 mg via ORAL
  Filled 2022-11-05: qty 1

## 2022-11-05 MED ORDER — DILTIAZEM LOAD VIA INFUSION
20.0000 mg | Freq: Once | INTRAVENOUS | Status: AC
  Administered 2022-11-05: 20 mg via INTRAVENOUS
  Filled 2022-11-05: qty 20

## 2022-11-05 MED ORDER — DILTIAZEM HCL-DEXTROSE 125-5 MG/125ML-% IV SOLN (PREMIX)
5.0000 mg/h | INTRAVENOUS | Status: DC
  Administered 2022-11-05: 5 mg/h via INTRAVENOUS
  Filled 2022-11-05: qty 125

## 2022-11-05 MED ORDER — DILTIAZEM HCL ER COATED BEADS 180 MG PO CP24: 180.0000 mg | ORAL_CAPSULE | Freq: Every day | ORAL | 2 refills | Status: DC

## 2022-11-05 NOTE — ED Provider Notes (Signed)
[facility]  Provider Note  CSN: [id number] Arrival date & time: 11/05/22 0422  History Chief Complaint  Patient presents with   Palpitations    Deborah Riddle is a 37 y.o. female with history of PAF has very infrequent episodes, not on anticoagulation and due to the infrequent nature of her episodes, she is not on any regularly scheduled rate control medications. She has propranolol she can take PRN but has not needed it in over a year. She reports she has sleep apnea and tonight her cpap mask came off so she wonders if that may have triggered an episode of afib and rapid rate. She is not having any CP or SOB. She has had increased urinary frequency. She has been avoiding stimulants at home but reports she has gained some weight recently and suspects that might be contributing.    Home Medications Prior to Admission medications   Medication Sig Start Date End Date Taking? Authorizing Provider  desonide (DESOWEN) 0.05 % cream Apply topically 2 (two) times daily. 10/07/22   Del Nigel Berthold, FNP  diltiazem (TIAZAC) 120 MG 24 hr capsule Take 1 capsule (120 mg total) by mouth daily. 12/11/21   Nahser, Deloris Ping, MD  ibuprofen (ADVIL) 800 MG tablet Take 800 mg by mouth every 8 (eight) hours as needed.    [provider]  Inositol-D Chiro-Inositol (OVASITOL) 2000-50 MG PACK See admin instructions.    [provider]  loratadine (CLARITIN) 10 MG tablet Take 10 mg by mouth daily.    [provider]  medroxyPROGESTERone Acetate (PROVERA PO) Take by mouth.    [provider]  metFORMIN (GLUCOPHAGE) 1000 MG tablet Take by mouth. 05/23/22   [provider]  metFORMIN (GLUCOPHAGE) 500 MG tablet Take 500 mg by mouth 2 (two) times daily with a meal. 10/07/19   [provider]  oxyCODONE (ROXICODONE) 5 MG immediate release tablet Take 1 tablet (5 mg total) by mouth every 4 (four) hours as needed for  severe pain. 04/06/22   Sabas Sous, MD  propranolol (INDERAL) 10 MG tablet Take 1 tablet (10 mg total) by mouth 4 (four) times daily as needed (palpitations). 07/27/21   Nahser, Deloris Ping, MD  Vitamin D, Ergocalciferol, (DRISDOL) 50000 UNITS CAPS capsule Take 50,000 Units by mouth every Saturday. 04/22/14   [provider]     Allergies    Dificid [fidaxomicin], Amoxicillin, Clindamycin/lincomycin, and Heparin   Review of Systems   Review of Systems Please see HPI for pertinent positives and negatives  Physical Exam BP (!) 120/95   Pulse 71   Temp 98 F (36.7 C) (Oral)   Resp 15   Ht  (1.753 m)   Wt (!) 142.5 kg   SpO2 100%   BMI 46.39 kg/m   Physical Exam Vitals and nursing note reviewed.  Constitutional:      Appearance: Normal appearance.  HENT:     Head: Normocephalic and atraumatic.     Nose: Nose normal.     Mouth/Throat:     Mouth: Mucous membranes are moist.  Eyes:     Extraocular Movements: Extraocular movements intact.     Conjunctiva/sclera: Conjunctivae normal.  Cardiovascular:     Rate and Rhythm: Tachycardia present. Rhythm irregular.  Pulmonary:     Effort: Pulmonary effort is normal.     Breath sounds: Normal breath sounds.  Abdominal:     General: Abdomen is flat.  Palpations: Abdomen is soft.     Tenderness: There is no abdominal tenderness.  Musculoskeletal:        General: No swelling. Normal range of motion.     Cervical back: Neck supple.  Skin:    General: Skin is warm and dry.  Neurological:     General: No focal deficit present.     Mental Status: She is alert.  Psychiatric:        Mood and Affect: Mood normal.     ED Results / Procedures / Treatments   EKG EKG Interpretation  Date/Time:  Tuesday November 05 2022 04:36:33 EDT Ventricular Rate:  128 PR Interval:    QRS Duration: 74 QT Interval:  312 QTC Calculation: 455 R Axis:   95 Text Interpretation: Atrial fibrillation with rapid ventricular response  with premature ventricular or aberrantly conducted complexes Rightward axis Nonspecific ST and T wave abnormality Abnormal ECG When compared with ECG of 15-Jul-2021 22:19, Atrial fibrillation has replaced Sinus rhythm Vent. rate has increased BY  55 BPM Confirmed by Susy Frizzle [phone]) on 11/05/2022 4:39:05 AM  Procedures .Critical Care  Performed by: Pollyann Savoy, MD Authorized by: Pollyann Savoy, MD   Critical care provider statement:    Critical care time (minutes):  45   Critical care time was exclusive of:  Separately billable procedures and treating other patients   Critical care was necessary to treat or prevent imminent or life-threatening deterioration of the following conditions:  Cardiac failure   Critical care was time spent personally by me on the following activities:  Development of treatment plan with patient or surrogate, discussions with consultants, evaluation of patient's response to treatment, examination of patient, ordering and review of laboratory studies, ordering and review of radiographic studies, ordering and performing treatments and interventions, pulse oximetry, re-evaluation of patient's condition and review of old charts   Medications Ordered in the ED Medications  diltiazem (CARDIZEM) 1 mg/mL load via infusion 20 mg (20 mg Intravenous Bolus from Bag 11/05/22 0510)    And  diltiazem (CARDIZEM) 125 mg in dextrose 5% 125 mL (1 mg/mL) infusion (5 mg/hr Intravenous New Bag/Given 11/05/22 0510)    Initial Impression and Plan  Patient with history of infrequent PAF here with tachypalpitations, found to be in rapid afib. BP is good. Will check labs, CXR, begin cardizem for rate control.   ED Course   Clinical Course as of 11/05/22 0714  Tue Nov 05, 2022  [id number] I personally viewed the images from radiology studies and agree with radiologist interpretation: CXR is clear.  [CS]  0533 CBC , BMP and Trop are unremarkable.  [CS]  0540 UA neg for infection  or glucose. HCG is neg.  [CS]  [phone] Care of the patient signed out to Dr. Durwin Nora at the change of shift.  [CS]    Clinical Course User Index [CS] Pollyann Savoy, MD     MDM Rules/Calculators/A&P Medical Decision Making Problems Addressed: Atrial fibrillation with rapid ventricular response: acute illness or injury  Amount and/or Complexity of Data Reviewed Labs: ordered. Decision-making details documented in ED Course. Radiology: ordered and independent interpretation performed. Decision-making details documented in ED Course. ECG/medicine tests: ordered and independent interpretation performed. Decision-making details documented in ED Course.  Risk Prescription drug management.     Final Clinical Impression(s) / ED Diagnoses Final diagnoses:  Atrial fibrillation with rapid ventricular response    Rx / DC Orders ED Discharge Orders     None  Pollyann Savoy, MD 11/05/22 [phone]

## 2022-11-05 NOTE — ED Triage Notes (Signed)
Pt states she woke up with palpitations, has hx of Afib. Does not take daily medication for it, only PRN. Not on blood thinners. Denies any chest pain or SOB.

## 2022-11-05 NOTE — Discharge Instructions (Signed)
A prescription for the diltiazem was sent to your pharmacy.  You did receive your dose for today.  Continue taking daily starting tomorrow and discuss further with your cardiologist.  Continue to monitor heart rate and blood pressures at home.  Return to the emergency department for any new or worsening symptoms of concern.

## 2022-11-05 NOTE — ED Notes (Signed)
Verbal to titrate down on cardizem drip to 2.5

## 2022-11-05 NOTE — ED Notes (Signed)
Cardizem drip stopped at this time, will monitor HR and pulse

## 2022-11-05 NOTE — ED Provider Notes (Signed)
Care of patient assumed from Dr. Bernette Mayers.  This patient with history of paroxysmal atrial fibrillation, presents for palpitations.  Found to be in atrial fibrillation with RVR on arrival.  She has since been rate controlled with diltiazem. Physical Exam  BP 102/62 (BP Location: Right Arm)   Pulse 78   Temp 98.3 F (36.8 C) (Oral)   Resp 18   Ht  (1.753 m)   Wt (!) 142.5 kg   SpO2 97%   BMI 46.39 kg/m   Physical Exam Vitals and nursing note reviewed.  Constitutional:      General: She is not in acute distress.    Appearance: Normal appearance. She is well-developed. She is not ill-appearing, toxic-appearing or diaphoretic.  HENT:     Head: Normocephalic and atraumatic.     Right Ear: External ear normal.     Left Ear: External ear normal.     Nose: Nose normal.     Mouth/Throat:     Mouth: Mucous membranes are moist.  Eyes:     Extraocular Movements: Extraocular movements intact.     Conjunctiva/sclera: Conjunctivae normal.  Cardiovascular:     Rate and Rhythm: Normal rate. Rhythm irregular.  Pulmonary:     Effort: Pulmonary effort is normal. No respiratory distress.  Abdominal:     General: There is no distension.     Palpations: Abdomen is soft.  Musculoskeletal:        General: No swelling. Normal range of motion.     Cervical back: Normal range of motion and neck supple.  Skin:    General: Skin is warm and dry.     Coloration: Skin is not jaundiced or pale.  Neurological:     General: No focal deficit present.     Mental Status: She is alert and oriented to person, place, and time.     Cranial Nerves: No cranial nerve deficit.     Sensory: No sensory deficit.     Motor: No weakness.     Coordination: Coordination normal.  Psychiatric:        Mood and Affect: Mood normal.        Behavior: Behavior normal.        Thought Content: Thought content normal.        Judgment: Judgment normal.     Procedures  Procedures  ED Course / MDM   Clinical Course as  of 11/05/22 1231  Tue Nov 05, 2022  4132 I personally viewed the images from radiology studies and agree with radiologist interpretation: CXR is clear.  [CS]  0533 CBC , BMP and Trop are unremarkable.  [CS]  0540 UA neg for infection or glucose. HCG is neg.  [CS]  [phone] Care of the patient signed out to Dr. Durwin Nora at the change of shift.  [CS]    Clinical Course User Index [CS] Pollyann Savoy, MD   Medical Decision Making Amount and/or Complexity of Data Reviewed Labs: ordered. Radiology: ordered.  Risk Prescription drug management.   On assessment, patient is resting comfortably.  Breathing is unlabored.  Heart rate and blood pressure are normal at this time.  She remains in a regular rhythm consistent with atrial fibrillation.  She is currently on 5 mg/h of diltiazem.  Patient is currently plugged in with cardiology.  She is agreeable to transition to oral dose of diltiazem and cardiology follow-up.  Oral dose was ordered.  CHA2DS2-VASc score is 1.  Will defer decision for anticoagulation to cardiologist.  Patient was  weaned off of diltiazem gtt.  Heart rate remained normal.  Blood pressure remained in the low-normal range.  While in the ED, patient was able to contact her cardiology office and does have an appointment scheduled for tomorrow morning.  She was advised to monitor heart rate and blood pressures at home and to discuss her medications further with cardiologist.  She was discharged in stable condition.       Gloris Manchester, MD 11/05/22 [phone]

## 2022-11-06 ENCOUNTER — Encounter: Payer: Self-pay | Admitting: Physician Assistant

## 2022-11-06 ENCOUNTER — Telehealth: Payer: Self-pay

## 2022-11-06 ENCOUNTER — Ambulatory Visit (INDEPENDENT_AMBULATORY_CARE_PROVIDER_SITE_OTHER): Payer: BC Managed Care – PPO | Admitting: Physician Assistant

## 2022-11-06 VITALS — BP 132/78 | HR 77 | Ht 69.0 in | Wt 312.0 lb

## 2022-11-06 DIAGNOSIS — I480 Paroxysmal atrial fibrillation: Secondary | ICD-10-CM

## 2022-11-06 NOTE — Patient Instructions (Signed)
Medication Instructions:   Your physician recommends that you continue on your current medications as directed. Please refer to the Current Medication list given to you today.  *If you need a refill on your cardiac medications before your next appointment, please call your pharmacy*  Lab Work: NONE ordered at this time of appointment   If you have labs (blood work) drawn today and your tests are completely normal, you will receive your results only by: MyChart Message (if you have MyChart) OR A paper copy in the mail If you have any lab test that is abnormal or we need to change your treatment, we will call you to review the results.  Testing/Procedures: NONE ordered at this time of appointment   Follow-Up: At [facility], you and your health needs are our priority.  As part of our continuing mission to provide you with exceptional heart care, we have created designated Provider Care Teams.  These Care Teams include your primary Cardiologist (physician) and Advanced Practice Providers (APPs -  Physician Assistants and Nurse Practitioners) who all work together to provide you with the care you need, when you need it.  Your next appointment:   As previously scheduled    Provider:   Kristeen Miss, MD    Armanda Magic, MD sleep clinic  Other Instructions

## 2022-11-06 NOTE — Transitions of Care (Post Inpatient/ED Visit) (Signed)
   11/06/2022  Name: Deborah Riddle MRN: [id number] DOB: 1985/12/07  Today's TOC FU Call Status: Today's TOC FU Call Status:: Unsuccessul Call (1st Attempt) Unsuccessful Call (1st Attempt) Date: 11/06/22  Attempted to reach the patient regarding the most recent Inpatient/ED visit.  Follow Up Plan: Additional outreach attempts will be made to reach the patient to complete the Transitions of Care (Post Inpatient/ED visit) call.   Signature Agnes Lawrence, CMA (AAMA)  [facility] [phone]

## 2022-11-06 NOTE — Progress Notes (Signed)
Cardiology Office Note:    Date:  11/08/2022   ID:  Deborah Riddle, DOB 1985/12/05, MRN [id number]  PCP:  Rica Records, FNP   [facility] Providers Cardiologist:  Kristeen Miss, MD     Referring MD: Wylene Men*   Chief Complaint  Patient presents with   Follow-up    History of Present Illness:    Deborah Riddle is a 37 y.o. female with a hx of PAF, OSA, sickle cell trait and morbid obesity.  She previously had a sleep study performed in [name], however we do not have the result.  She underwent a sleep study again in May 2023 which showed mild obstructive sleep apnea with AHI 8.0/h overall but severe during REM sleep, therefore recommend CPAP titration.  It was recommended to avoid alcohol, sedatives or other CNS depressant as they may worsen sleep apnea and that disrupt normal sleep architecture.  She eventually underwent CPAP titration on 01/10/2022.  She is being followed by Dr. Mayford Knife for OSA.  She has a history of very rare episode of atrial fibrillation since several years ago.  She is not on anticoagulation due to infrequent nature of her episodes.  She has propranolol PRN but has not taken it for over a year.  She presented to the ED on 11/05/2022 due to recurrence of rapid A-fib.  She attributed the recurrence to coming off of CPAP in the middle of the night.  Recent TSH and free T4 were normal.  Lab work obtained in the ED including hemoglobin, renal function and electrolyte were normal.  She was placed on Cardizem 180 mg daily.  Patient presents today for follow-up.  She has not started on the Cardizem.  She says her heart rate was controlled on diltiazem in the ED, after she went home she, she fell asleep.  By the time she woke up, she has self converted back to sinus rhythm.  On physical exam, her heart rate is very regular.  She does not have any heart failure symptoms.  EKG obtained in the clinic confirms she has went back into  sinus rhythm.  Given a rare occurrence and the CHA2DS2-Vasc score of only 1 for being female, I will not start her on anticoagulation therapy at this time.  I encouraged her to start on Cardizem for maintenance therapy, if she is able to lose significant amount of weight and get OSA under good control, we may consider discontinue Cardizem in the future.  She can follow-up with Dr. Elease Hashimoto as previously scheduled.  She will need follow-up with Dr. Mayford Knife in the next few months in the sleep clinic.   Past Medical History:  Diagnosis Date   Body mass index 40.0-44.9, adult (HCC)    Chest pain, unspecified    Dysrhythmia    AFib   GERD (gastroesophageal reflux disease)    Morbid obesity (HCC)    Paroxysmal atrial fibrillation (HCC)    Polycystic disease, ovaries    Sickle cell trait (HCC)    Tobacco use disorder     Past Surgical History:  Procedure Laterality Date   BIOPSY  08/07/2018   Procedure: BIOPSY;  Surgeon: West Bali, MD;  Location[facility];  Service: Endoscopy;;  gastric bx's   DILATION AND EVACUATION  07/07/2020   Procedure: DILATATION AND EVACUATION;  Surgeon: Maxie Better, MD;  Location: [facility] OR;  Service: Gynecology;;   ESOPHAGOGASTRODUODENOSCOPY N/A 08/07/2018   Procedure: ESOPHAGOGASTRODUODENOSCOPY (EGD);  Surgeon: Darrick Penna,  Darleene Cleaver, MD;  Location: [facility];  Service: Endoscopy;  Laterality: N/A;  3:00pm   HYDRADENITIS EXCISION Right 05/29/2018   Procedure: EXCISION HIDRADENITIS AXILLA;  Surgeon: Lucretia Roers, MD;  Location: [facility] ORS;  Service: General;  Laterality: Right;   MALONEY DILATION N/A 08/07/2018   Procedure: Alvy Beal;  Surgeon: West Bali, MD;  Location: [facility];  Service: Endoscopy;  Laterality: N/A;   none     SAVORY DILATION N/A 08/07/2018   Procedure: SAVORY DILATION;  Surgeon: West Bali, MD;  Location: [facility];  Service: Endoscopy;  Laterality: N/A;   WISDOM TOOTH EXTRACTION      Current  Medications: Current Meds  Medication Sig   desonide (DESOWEN) 0.05 % cream Apply topically 2 (two) times daily.   loratadine (CLARITIN) 10 MG tablet Take 10 mg by mouth as needed for allergies or rhinitis.   metFORMIN (GLUCOPHAGE) 500 MG tablet Take 500 mg by mouth 2 (two) times daily with a meal. Take 500 mg in the morning and 1500 mg in the evenings   Vitamin D, Ergocalciferol, (DRISDOL) 50000 UNITS CAPS capsule Take 50,000 Units by mouth every Saturday.     Allergies:   Dificid [fidaxomicin], Amoxicillin, Clindamycin/lincomycin, and Heparin   Social History   Socioeconomic History   Marital status: Married    Spouse name: CHRISTOPHER    Number of children: Not on file   Years of education: Not on file   Highest education level: Not on file  Occupational History   Occupation: PHLEBOTOMIST    Employer: [employer]  Tobacco Use   Smoking status: Former    Packs/day: 1.00    Years: 12.00    Additional pack years: 0.00    Total pack years: 12.00    Types: Cigarettes    Start date: 01/21/2004    Quit date: 09/10/2011    Years since quitting: 11.1   Smokeless tobacco: Never  Vaping Use   Vaping Use: Never used  Substance and Sexual Activity   Alcohol use: Never    Alcohol/week: 0.0 standard drinks of alcohol   Drug use: No   Sexual activity: Yes  Other Topics Concern   Not on file  Social History Narrative   Lives in [place], [place], [place] with husband.  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   Social Determinants of Health   Financial Resource Strain: Not on file  Food Insecurity: Not on file  Transportation Needs: Not on file  Physical Activity: Not on file  Stress: Not on file  Social Connections: Not on file     Family History: The patient's family history includes Arrhythmia (age of onset: 8) in her mother; COPD in her mother; Colon cancer in her maternal uncle; Colon polyps in her mother; Crohn's disease in her  cousin; Diabetes in her mother, sister, and another family member; Hypertension in her father, mother, sister, and another family member.  ROS:   Please see the history of present illness.     All other systems reviewed and are negative.  EKGs/Labs/Other Studies Reviewed:    The following studies were reviewed today:  Echo 05/11/2021  1. Left ventricular ejection fraction, by estimation, is 60 to 65%. The  left ventricle has normal function. The left ventricle has no regional  wall motion abnormalities. Left ventricular diastolic parameters were  normal.   2. Right ventricular systolic function is normal. The right ventricular  size is mildly  enlarged.   3. The mitral valve is normal in structure. Trivial mitral valve  regurgitation. No evidence of mitral stenosis.   4. The aortic valve is tricuspid. Aortic valve regurgitation is not  visualized. No aortic stenosis is present.   5. The inferior vena cava is normal in size with greater than 50%  respiratory variability, suggesting right atrial pressure of 3 mmHg.   Comparison(s): No prior Echocardiogram.    EKG:  EKG is ordered today.  The ekg ordered today demonstrates NSR with TWI in V1-V3  Recent Labs: 10/07/2022: ALT 15; TSH 2.070 11/05/2022: BUN 12; Creatinine, Ser 0.90; Hemoglobin 12.3; Magnesium 2.0; Platelets PLATELET CLUMPS NOTED ON SMEAR, COUNT APPEARS ADEQUATE; Potassium 3.7; Sodium 138  Recent Lipid Panel    Component Value Date/Time   CHOL 192 10/07/2022 0916   TRIG 87 10/07/2022 0916   HDL 54 10/07/2022 0916   CHOLHDL 3.6 10/07/2022 0916   LDLCALC 122 (H) 10/07/2022 0916     Risk Assessment/Calculations:    CHA2DS2-VASc Score = 1   This indicates a 0.6% annual risk of stroke. The patient's score is based upon: CHF History: 0 HTN History: 0 Diabetes History: 0 Stroke History: 0 Vascular Disease History: 0 Age Score: 0 Gender Score: 1           Physical Exam:    VS:  BP 132/78   Pulse 77   Ht  5\' 9"  (1.753 m)   Wt (!) 312 lb (141.5 kg)   SpO2 98%   BMI 46.07 kg/m        Wt Readings from Last 3 Encounters:  11/06/22 (!) 312 lb (141.5 kg)  11/05/22 (!) 314 lb 2.5 oz (142.5 kg)  11/01/22 (!) 314 lb 0.6 oz (142.4 kg)     GEN:  Well nourished, well developed in no acute distress HEENT: Normal NECK: No JVD; No carotid bruits LYMPHATICS: No lymphadenopathy CARDIAC: RRR, no murmurs, rubs, gallops RESPIRATORY:  Clear to auscultation without rales, wheezing or rhonchi  ABDOMEN: Soft, non-tender, non-distended MUSCULOSKELETAL:  No edema; No deformity  SKIN: Warm and dry NEUROLOGIC:  Alert and oriented x 3 PSYCHIATRIC:  Normal affect   ASSESSMENT:    1. Paroxysmal atrial fibrillation (HCC)    PLAN:    In order of problems listed above:  Paroxysmal atrial fibrillation: She has reverted back to sinus rhythm.  Continue on Cardizem.  She will need to get obstructive sleep apnea under good control.           Medication Adjustments/Labs and Tests Ordered: Current medicines are reviewed at length with the patient today.  Concerns regarding medicines are outlined above.  Orders Placed This Encounter  Procedures   EKG 12-Lead   No orders of the defined types were placed in this encounter.   Patient Instructions  Medication Instructions:   Your physician recommends that you continue on your current medications as directed. Please refer to the Current Medication list given to you today.  *If you need a refill on your cardiac medications before your next appointment, please call your pharmacy*  Lab Work: NONE ordered at this time of appointment   If you have labs (blood work) drawn today and your tests are completely normal, you will receive your results only by: MyChart Message (if you have MyChart) OR A paper copy in the mail If you have any lab test that is abnormal or we need to change your treatment, we will call you to review the  results.  Testing/Procedures: NONE  ordered at this time of appointment   Follow-Up: At [facility], you and your health needs are our priority.  As part of our continuing mission to provide you with exceptional heart care, we have created designated Provider Care Teams.  These Care Teams include your primary Cardiologist (physician) and Advanced Practice Providers (APPs -  Physician Assistants and Nurse Practitioners) who all work together to provide you with the care you need, when you need it.  Your next appointment:   As previously scheduled    Provider:   Kristeen Miss, MD    Armanda Magic, MD sleep clinic  Other Instructions     Signed, Azalee Course, [place]  11/08/2022 9:14 PM    [facility]

## 2022-11-07 NOTE — Telephone Encounter (Signed)
The pt was started on Cardizem while in the hospital on Tuesday, in a drip. She did not have her follow up visit until yesterday- so did not have Cardizem yesterday. She took the Cardizem today  Pt states she is having more indigestion and fatigue- feels like a fullness in chest/back- feels like if she could "burp,"she would feel better.  She said she had to lay down, bc she was so tired. No dizziness or sob. She feels a little less tired now.   She reports that her BP and Hr has been fine.   She just wanted to know if these are normal side effects?

## 2022-11-13 ENCOUNTER — Ambulatory Visit (HOSPITAL_COMMUNITY): Payer: BC Managed Care – PPO

## 2022-11-13 NOTE — Transitions of Care (Post Inpatient/ED Visit) (Unsigned)
   11/13/2022  Name: Deborah Riddle MRN: [id number] DOB: [birth year]  Today's TOC FU Call Status: Today's TOC FU Call Status:: Unsuccessful Call (2nd Attempt) Unsuccessful Call (1st Attempt) Date: 11/06/22 Unsuccessful Call (2nd Attempt) Date: 11/13/22  Attempted to reach the patient regarding the most recent Inpatient/ED visit.  Follow Up Plan: Additional outreach attempts will be made to reach the patient to complete the Transitions of Care (Post Inpatient/ED visit) call.   Signature Agnes Lawrence, CMA (AAMA)  [facility] [phone]

## 2022-11-14 NOTE — Transitions of Care (Post Inpatient/ED Visit) (Signed)
   11/14/2022  Name: Deborah Riddle MRN: [id number] DOB: 11-Jan-1986  Today's TOC FU Call Status: Today's TOC FU Call Status:: Unsuccessful Call (3rd Attempt) Unsuccessful Call (1st Attempt) Date: 11/06/22 Unsuccessful Call (2nd Attempt) Date: 11/13/22 Unsuccessful Call (3rd Attempt) Date: 11/14/22  Attempted to reach the patient regarding the most recent Inpatient/ED visit.  Follow Up Plan: No further outreach attempts will be made at this time. We have been unable to contact the patient.  Signature Agnes Lawrence, CMA (AAMA)  [facility] [phone]

## 2022-11-27 ENCOUNTER — Telehealth: Payer: Self-pay | Admitting: Cardiovascular Disease

## 2022-11-27 DIAGNOSIS — I493 Ventricular premature depolarization: Secondary | ICD-10-CM

## 2022-11-27 NOTE — Telephone Encounter (Signed)
Patient returned RN's call and stated can communicate with her through MyChart or reach her at [phone] .

## 2022-11-27 NOTE — Telephone Encounter (Signed)
Addressed palpitation in separate phone note

## 2022-11-27 NOTE — Telephone Encounter (Signed)
I have called and spoke with Ms Deborah Riddle. Her fatigue is better but she continue to have frequent episodes of skipped beats. Suspect PVCs. I recommended a 3 day ZIO, she was agreeable.   Please arrange a 3 day Zio for palpitation.  Keep follow up with Dr. Elease Hashimoto

## 2022-11-27 NOTE — Telephone Encounter (Signed)
Upon patient request DME selection is [facility] Patient understands he will be contacted by [facility] to set up his cpap. Patient understands to call if [facility] does not contact him with new setup in a timely manner. Patient understands they will be called once confirmation has been received from [facility] that they have received their new machine to schedule 10 week follow up appointment.  [facility] notified of new cpap order  Please add to airview Patient was grateful for the call and thanked me. 

## 2022-12-02 ENCOUNTER — Ambulatory Visit: Payer: BC Managed Care – PPO | Attending: Physician Assistant

## 2022-12-02 DIAGNOSIS — M5431 Sciatica, right side: Secondary | ICD-10-CM | POA: Diagnosis not present

## 2022-12-02 DIAGNOSIS — I493 Ventricular premature depolarization: Secondary | ICD-10-CM

## 2022-12-02 NOTE — Telephone Encounter (Signed)
Placed order for 3 day Zio

## 2022-12-02 NOTE — Progress Notes (Unsigned)
Enrolled for [employer] to mail a ZIO XT long term holter monitor to the patients address on file.   Dr. Nahser to read. 

## 2022-12-12 DIAGNOSIS — I493 Ventricular premature depolarization: Secondary | ICD-10-CM

## 2023-01-08 DIAGNOSIS — G4733 Obstructive sleep apnea (adult) (pediatric): Secondary | ICD-10-CM | POA: Diagnosis not present

## 2023-01-21 ENCOUNTER — Encounter: Payer: Self-pay | Admitting: Cardiovascular Disease

## 2023-01-21 ENCOUNTER — Ambulatory Visit: Payer: BC Managed Care – PPO | Attending: Cardiovascular Disease | Admitting: Cardiovascular Disease

## 2023-01-21 DIAGNOSIS — I4891 Unspecified atrial fibrillation: Secondary | ICD-10-CM

## 2023-01-21 MED ORDER — PROPRANOLOL HCL 10 MG PO TABS
10.00 mg | ORAL_TABLET | Freq: Four times a day (QID) | ORAL | 5 refills | Status: AC | PRN
Start: 2023-01-21 — End: ?

## 2023-01-21 NOTE — Patient Instructions (Addendum)
Medication Instructions:  Your physician has recommended you make the following change in your medication:   1) STOP diltiazem CD (Cardizem)  *If you need a refill on your cardiac medications before your next appointment, please call your pharmacy*  Lab Work: None ordered today.  Testing/Procedures: None ordered today.  Follow-Up: At [facility], you and your health needs are our priority.  As part of our continuing mission to provide you with exceptional heart care, we have created designated Provider Care Teams.  These Care Teams include your primary Cardiologist (physician) and Advanced Practice Providers (APPs -  Physician Assistants and Nurse Practitioners) who all work together to provide you with the care you need, when you need it.  Your next appointment:   1 year(s)  The format for your next appointment:   In Person  Provider:   Azalee Course, PA-C  You have been referred to our [facility] Program. A scheduler will contact you to schedule an appointment. If you do no hear from someone in 4-6 weeks, please call our office at [phone].

## 2023-01-21 NOTE — Progress Notes (Signed)
Cardiology Office Note:    Date:  01/21/2023   ID:  Deborah Riddle, DOB May 24, 1986, MRN [id number]  PCP:  Rica Records, FNP   [facility] Providers Cardiologist:  Bettyjane Shenoy    Referring MD: Wylene Men*   Chief Complaint  Patient presents with   Atrial Fibrillation     Oct. 11, 2022   Deborah Riddle is a 37 y.o. female with a hx of paroxysmal atrial fibrillation, morbid obesity   She has seen Dr. Judd Gaudier sworn, Dr. Kirke Corin , and Dr. Johney Frame in the past.  She has been tried on flecainide but did not tolerate it due to wheezing.  She had a sleep study done at [facility].  We do not have the results for that.  She has been referred to the A. fib clinic as well as the bariatric surgical clinic at [facility].  She has not been to either .  Wt was 293 lbs as of June 2016.   She was seen by Joni Reining, NP for follow-up in December, 2017.  She is starting verapamil and was encouraged to retry flecainide 50 mg twice a day.   She has not had any atrial fib for years. She has been under lots of stress with the passing of her mother last November.  Has started drinknig herbal tea Husband says she snores at night  - not sure she has apneic episodes.  On Oct. 7, she work up with palpitations and was found to have atrial fib .   She received IV Cardizem, IV fluids.  Decided not to have a cardioversion Went back to bed , work up with normal sinus rhythm She has not been on Flecainide.     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  July 27, 2021: Patient is seen today for follow-up of her paroxysmal atrial fibrillation and morbid obesity   wt is 301 lbs . ( Down 9 lbs )  Tried to schedule her sleep study but could not , the referal has expired  Has had palpitations ( increased HR into the 90s)when she is waking up or just going to bed   Palpitations last night lasted many hours .  Is walking 30 minutes, 4 times a week    October 25, 2021: Quietly she is seen today for follow-up of her paroxysmal atrial fibrillation and morbid obesity.  Current weight is 296 pounds which is down 5 pounds from January of this year. Still working on more weight loss  Is on metformin for PCOS  Will get hemoglobin A1c level today.  She would like to try one of the weight loss medications such as BJYNWG.   January 21, 2023: Deborah Riddle is seen for her PAF and morbid obesity  Wt is 294 lbs   [facility]   [facility] not qualify for [facility]   No further episodes of palpitations of PAF   Has been walking  Is not taking the Diltiazem regularly  Event monitor showed PVCs  No PAF  Will    Past Medical History:  Diagnosis Date   Body mass index 40.0-44.9, adult (HCC)    Chest pain, unspecified    Dysrhythmia    AFib   GERD (gastroesophageal reflux disease)    Morbid obesity (HCC)    Paroxysmal atrial fibrillation (  HCC)    Polycystic disease, ovaries    Sickle cell trait (HCC)    Tobacco use disorder     Past Surgical History:  Procedure Laterality Date   BIOPSY  08/07/2018   Procedure: BIOPSY;  Surgeon: West Bali, MD;  [facility];  Service: Endoscopy;;  gastric bx's   DILATION AND EVACUATION  07/07/2020   Procedure: DILATATION AND EVACUATION;  Surgeon: Maxie Better, MD;  [facility]: [facility];  Service: Gynecology;;   ESOPHAGOGASTRODUODENOSCOPY N/A 08/07/2018   Procedure: ESOPHAGOGASTRODUODENOSCOPY (EGD);  Surgeon: West Bali, MD;  [facility];  Service: Endoscopy;  Laterality: N/A;  3:00pm   HYDRADENITIS EXCISION Right 05/29/2018   Procedure: EXCISION HIDRADENITIS AXILLA;  Surgeon: Lucretia Roers, MD;  Location: [facility] ORS;  Service: General;  Laterality: Right;   MALONEY DILATION N/A 08/07/2018   Procedure: Alvy Beal;  Surgeon: West Bali, MD;  [facility];  Service: Endoscopy;  Laterality: N/A;   none     SAVORY DILATION N/A 08/07/2018   Procedure: SAVORY DILATION;  Surgeon: West Bali, MD;  [facility];  Service: Endoscopy;  Laterality: N/A;   WISDOM TOOTH EXTRACTION      Current Medications: Current Meds  Medication Sig   loratadine (CLARITIN) 10 MG tablet Take 10 mg by mouth as needed for allergies or rhinitis.   metFORMIN (GLUCOPHAGE) 500 MG tablet Take 500 mg by mouth 2 (two) times daily with a meal. Take 500 mg in the morning and 1500 mg in the evenings   Vitamin D, Ergocalciferol, (DRISDOL) 50000 UNITS CAPS capsule Take 50,000 Units by mouth every Saturday.   [DISCONTINUED] diltiazem (CARDIZEM CD) 180 MG 24 hr capsule Take 1 capsule (180 mg total) by mouth daily.   [DISCONTINUED] propranolol (INDERAL) 10 MG tablet Take 1 tablet (10 mg total) by mouth 4 (four) times daily as needed (palpitations).     Allergies:   Dificid [fidaxomicin], Amoxicillin, Clindamycin/lincomycin, and Heparin   Social History   Socioeconomic History   Marital status: Married    Spouse name: CHRISTOPHER    Number of children: Not on file   Years of education: Not on file   Highest education level: Not on file  Occupational History   Occupation: PHLEBOTOMIST    Employer: [employer]  Tobacco Use   Smoking status: Former    Packs/day: 1.00    Years: 12.00    Additional pack years: 0.00    Total pack years: 12.00    Types: Cigarettes    Start date: 01/21/2004    Quit date: 09/10/2011    Years since quitting: 11.3   Smokeless tobacco: Never  Vaping Use   Vaping Use: Never used  Substance and Sexual Activity   Alcohol use: Never    Alcohol/week: 0.0 standard drinks of alcohol   Drug use: No   Sexual activity: Yes  Other Topics Concern   Not on file  Social History Narrative   Lives in [place], [place], [place] with husband.  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   Social  Determinants of Health   Financial Resource Strain: Not on file  Food Insecurity: Not on file  Transportation Needs: Not on file  Physical Activity: Not on file  Stress: Not on file  Social Connections: Not on file     Family History: The patient's family history includes Arrhythmia (age of onset: 35) in her mother; COPD in her mother;  Colon cancer in her maternal uncle; Colon polyps in her mother; Crohn's disease in her cousin; Diabetes in her mother, sister, and another family member; Hypertension in her father, mother, sister, and another family member.  ROS:   Please see the history of present illness.     EKGs/Labs/Other Studies Reviewed:    The following studies were reviewed today:   EKG:     Recent Labs: 10/07/2022: ALT 15; TSH 2.070 11/05/2022: BUN 12; Creatinine, Ser 0.90; Hemoglobin 12.3; Magnesium 2.0; Platelets PLATELET CLUMPS NOTED ON SMEAR, COUNT APPEARS ADEQUATE; Potassium 3.7; Sodium 138  Recent Lipid Panel    Component Value Date/Time   CHOL 192 10/07/2022 0916   TRIG 87 10/07/2022 0916   HDL 54 10/07/2022 0916   CHOLHDL 3.6 10/07/2022 0916   LDLCALC 122 (H) 10/07/2022 0916     Risk Assessment/Calculations:           Physical Exam:     Physical Exam: Blood pressure 100/82, pulse 76, height 5\' 9"  (1.753 m), weight 294 lb (133.4 kg), SpO2 97 %.    She did not weigh today    GEN:   morbidly obese female in no acute distress HEENT: Normal NECK: No JVD; No carotid bruits LYMPHATICS: No lymphadenopathy CARDIAC: RRR , no murmurs, rubs, gallops RESPIRATORY:  Clear to auscultation without rales, wheezing or rhonchi  ABDOMEN: Soft, non-tender, non-distended MUSCULOSKELETAL:  No edema; No deformity  SKIN: Warm and dry NEUROLOGIC:  Alert and oriented x 3   ASSESSMENT:    1. Morbid obesity (HCC)   2. Atrial fibrillation, unspecified type (HCC)       PLAN:       PAF :  CHADS2VASC is 1 ( female )  She only has very rare episodes of  palpitations that last for a few seconds.     None recently     2.  Obesity:    this will be her greatest challenge from a health standpont  Did not qualify for [facility] Has not heard [facility] - will place another referral  Advised her to cut back on carbs  Avoid foods that are White, wheat, sweet  - white foods - limit your  rice, pasta, potatoes, corn.  if you do eat these, make sure its brown rice, whole wheat pasta  - wheat - limit intake of all bread, biscuits, pizza dough.   The bread you do eat should be whole wheat bread.  - Sweets - limit cakes, cookies, desserts, ice cream, soda, sweet tea   Increase exercise     Will have her see Azalee Course, PA in a year       Medication Adjustments/Labs and Tests Ordered: Current medicines are reviewed at length with the patient today.  Concerns regarding medicines are outlined above.  Orders Placed This Encounter  Procedures   Amb Ref to Medical Weight Management   Meds ordered this encounter  Medications   propranolol (INDERAL) 10 MG tablet    Sig: Take 1 tablet (10 mg total) by mouth 4 (four) times daily as needed (palpitations).    Dispense:  120 tablet    Refill:  5     Patient Instructions  Medication Instructions:  Your physician has recommended you make the following change in your medication:   1) STOP diltiazem CD (Cardizem)  *If you need a refill on your cardiac medications before your next appointment, please call your pharmacy*  Lab Work: None ordered today.  Testing/Procedures: None ordered  today.  Follow-Up: At [facility], you and your health needs are our priority.  As part of our continuing mission to provide you with exceptional heart care, we have created designated Provider Care Teams.  These Care Teams include your primary Cardiologist (physician) and Advanced Practice Providers (APPs -  Physician Assistants and Nurse Practitioners) who all work together to provide you  with the care you need, when you need it.  Your next appointment:   1 year(s)  The format for your next appointment:   In Person  Provider:   Azalee Course, PA-C  You have been referred to our Healthy Weight and Wellness Program. A scheduler will contact you to schedule an appointment. If you do no hear from someone in 4-6 weeks, please call our office at [phone].   Signed, Kristeen Miss, MD  01/21/2023 2:39 PM    [facility]

## 2023-01-23 ENCOUNTER — Encounter: Payer: Self-pay | Admitting: Family Medicine

## 2023-01-24 ENCOUNTER — Telehealth: Payer: Self-pay | Admitting: Family Medicine

## 2023-01-24 ENCOUNTER — Other Ambulatory Visit: Payer: Self-pay | Admitting: Family Medicine

## 2023-01-24 MED ORDER — METFORMIN HCL 500 MG PO TABS: 500.0000 mg | ORAL_TABLET | Freq: Two times a day (BID) | ORAL | 1 refills | Status: DC

## 2023-01-24 NOTE — Telephone Encounter (Signed)
Deborah Riddle w. [facility] called in needs clarification on  metFORMIN (GLUCOPHAGE) 500 MG tablet

## 2023-03-03 ENCOUNTER — Ambulatory Visit: Payer: BC Managed Care – PPO | Attending: Cardiology | Admitting: Cardiology

## 2023-03-03 ENCOUNTER — Encounter: Payer: Self-pay | Admitting: Cardiology

## 2023-03-03 VITALS — BP 110/70 | HR 72 | Ht 69.0 in | Wt 298.0 lb

## 2023-03-03 DIAGNOSIS — G4733 Obstructive sleep apnea (adult) (pediatric): Secondary | ICD-10-CM | POA: Diagnosis not present

## 2023-03-03 NOTE — Patient Instructions (Signed)
Medication Instructions:  Your physician recommends that you continue on your current medications as directed. Please refer to the Current Medication list given to you today.  *If you need a refill on your cardiac medications before your next appointment, please call your pharmacy*   Lab Work: None.  If you have labs (blood work) drawn today and your tests are completely normal, you will receive your results only by: MyChart Message (if you have MyChart) OR A paper copy in the mail If you have any lab test that is abnormal or we need to change your treatment, we will call you to review the results.   Testing/Procedures: None.   Follow-Up: At [facility], you and your health needs are our priority.  As part of our continuing mission to provide you with exceptional heart care, we have created designated Provider Care Teams.  These Care Teams include your primary Cardiologist (physician) and Advanced Practice Providers (APPs -  Physician Assistants and Nurse Practitioners) who all work together to provide you with the care you need, when you need it.  We recommend signing up for the patient portal called "MyChart".  Sign up information is provided on this After Visit Summary.  MyChart is used to connect with patients for Virtual Visits (Telemedicine).  Patients are able to view lab/test results, encounter notes, upcoming appointments, etc.  Non-urgent messages can be sent to your provider as well.   To learn more about what you can do with MyChart, go to [email or web address].    Your next appointment:   1 year(s)  Provider:   Dr. Armanda Magic, MD   Other Instructions Dr. Mayford Knife has ordered a mask fitting for you. Someone from your DME company will call you to set this up.

## 2023-03-03 NOTE — Progress Notes (Signed)
Sleep Medicine CONSULT Note    Date:  03/03/2023   ID:  Deborah Riddle, DOB 1986/05/03, MRN [id number]  PCP:  Rica Records, FNP  Cardiologist: Kristeen Miss, MD   Chief Complaint  Patient presents with   New Patient (Initial Visit)    Obstructive sleep apnea    History of Present Illness:  Deborah Riddle is a 37 y.o. female who is being seen today for the evaluation of obstructive sleep apnea at the request of Kristeen Miss, MD.  This is a 37 year old African-American female with a history of morbid obesity, atrial fibrillation, GERD and tobacco abuse.  She was seen by Dr. Elease Hashimoto a year ago and due to her atrial fibrillation a sleep study was ordered.  She underwent home sleep study in May 2023 which revealed mild obstructive sleep apnea with an AHI of 8/h but severe during REM sleep it was 33.3/hr.  She underwent CPAP titration to 10 cm H2O.  She is now referred for sleep medicine consultation for further treatment of her obstructive sleep apnea.  She is doing well withher PAP device but still struggles to find a mask that works well for her.  She uses the full face  mask and feels the pressure is adequate.  Since going on PAP she feels rested in the am and has no significant daytime sleepiness.  She denies any significant mouth or nasal dryness or nasal congestion.  She does not think that he snores.    Past Medical History:  Diagnosis Date   Body mass index 40.0-44.9, adult (HCC)    Chest pain, unspecified    Dysrhythmia    AFib   GERD (gastroesophageal reflux disease)    Morbid obesity (HCC)    Paroxysmal atrial fibrillation (HCC)    Polycystic disease, ovaries    Sickle cell trait (HCC)    Tobacco use disorder     Past Surgical History:  Procedure Laterality Date   BIOPSY  08/07/2018   Procedure: BIOPSY;  Surgeon: West Bali, MD;  [facility];  Service: Endoscopy;;  gastric bx's   DILATION AND EVACUATION  07/07/2020   Procedure:  DILATATION AND EVACUATION;  Surgeon: Maxie Better, MD;  Location: [facility];  Service: Gynecology;;   ESOPHAGOGASTRODUODENOSCOPY N/A 08/07/2018   Procedure: ESOPHAGOGASTRODUODENOSCOPY (EGD);  Surgeon: West Bali, MD;  [facility];  Service: Endoscopy;  Laterality: N/A;  3:00pm   HYDRADENITIS EXCISION Right 05/29/2018   Procedure: EXCISION HIDRADENITIS AXILLA;  Surgeon: Lucretia Roers, MD;  Location: [facility];  Service: General;  Laterality: Right;   MALONEY DILATION N/A 08/07/2018   Procedure: Alvy Beal;  Surgeon: West Bali, MD;  [facility];  Service: Endoscopy;  Laterality: N/A;   none     SAVORY DILATION N/A 08/07/2018   Procedure: SAVORY DILATION;  Surgeon: West Bali, MD;  [facility];  Service: Endoscopy;  Laterality: N/A;   WISDOM TOOTH EXTRACTION      Current Medications: Current Meds  Medication Sig   desonide (DESOWEN) 0.05 % cream Apply topically 2 (two) times daily.   loratadine (CLARITIN) 10 MG tablet Take 10 mg by mouth as needed for allergies or rhinitis.   metFORMIN (GLUCOPHAGE) 500 MG tablet Take 1 tablet (500 mg total) by mouth 2 (two) times daily with a meal.   propranolol (INDERAL) 10 MG tablet Take 1 tablet (10 mg total) by mouth 4 (four) times daily as needed (palpitations).  Vitamin D, Ergocalciferol, (DRISDOL) 50000 UNITS CAPS capsule Take 50,000 Units by mouth every Saturday.    Allergies:   Dificid [fidaxomicin], Amoxicillin, Clindamycin/lincomycin, and Heparin   Social History   Socioeconomic History   Marital status: Married    Spouse name: CHRISTOPHER    Number of children: Not on file   Years of education: Not on file   Highest education level: Not on file  Occupational History   Occupation: PHLEBOTOMIST    Employer: [employer]  Tobacco Use   Smoking status: Former    Current packs/day: 0.00    Average packs/day: 1 pack/day for 12.0 years (12.0 ttl pk-yrs)    Types: Cigarettes     Start date: 01/21/2004    Quit date: 09/10/2011    Years since quitting: 11.4   Smokeless tobacco: Never  Vaping Use   Vaping status: Never Used  Substance and Sexual Activity   Alcohol use: Never    Alcohol/week: 0.0 standard drinks of alcohol   Drug use: No   Sexual activity: Yes  Other Topics Concern   Not on file  Social History Narrative   Lives in [place], [place], [place] with husband.  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   Social Determinants of Health   Financial Resource Strain: Not on file  Food Insecurity: Not on file  Transportation Needs: Not on file  Physical Activity: Not on file  Stress: Not on file  Social Connections: Not on file     Family History:  The patient's family history includes Arrhythmia (age of onset: 38) in her mother; COPD in her mother; Colon cancer in her maternal uncle; Colon polyps in her mother; Crohn's disease in her cousin; Diabetes in her mother, sister, and another family member; Hypertension in her father, mother, sister, and another family member.   ROS:   Please see the history of present illness.    ROS All other systems reviewed and are negative.      No data to display             PHYSICAL EXAM:   VS:  BP 110/70 (BP Location: Left Arm, Patient Position: Sitting, Cuff Size: Normal)   Pulse 72   Ht 5\' 9"  (1.753 m)   Wt 298 lb (135.2 kg)   SpO2 98%   BMI 44.01 kg/m    GEN: Well nourished, well developed, in no acute distress  HEENT: normal  Neck: no JVD, carotid bruits, or masses Cardiac: RRR; no murmurs, rubs, or gallops,no edema.  Intact distal pulses bilaterally.  Respiratory:  clear to auscultation bilaterally, normal work of breathing GI: soft, nontender, nondistended, + BS MS: no deformity or atrophy  Skin: warm and dry, no rash Neuro:  Alert and Oriented x 3, Strength and sensation are intact Psych: euthymic mood, full affect  Wt Readings from Last 3 Encounters:   03/03/23 298 lb (135.2 kg)  01/21/23 294 lb (133.4 kg)  11/06/22 (!) 312 lb (141.5 kg)      Studies/Labs Reviewed:   Home sleep study, CPAP titration, PAP compliance download  Recent Labs: 10/07/2022: ALT 15; TSH 2.070 11/05/2022: BUN 12; Creatinine, Ser 0.90; Hemoglobin 12.3; Magnesium 2.0; Platelets PLATELET CLUMPS NOTED ON SMEAR, COUNT APPEARS ADEQUATE; Potassium 3.7; Sodium 138    CHA2DS2-VASc Score = 1   This indicates a 0.6% annual risk of stroke. The patient's score is based upon: CHF History: 0 HTN History: 0 Diabetes History: 0 Stroke History: 0 Vascular  Disease History: 0 Age Score: 0 Gender Score: 1     Additional studies/ records that were reviewed today include:  none    ASSESSMENT:    1. OSA (obstructive sleep apnea)      PLAN:  In order of problems listed above:  OSA - The patient is tolerating PAP therapy well without any problems. The PAP download performed by his DME was personally reviewed and interpreted by me today and showed an AHI of 0.5/hr on 10 cm H2O with 73% compliance in using more than 4 hours nightly.  The patient has been using and benefiting from PAP use and will continue to benefit from therapy.  -I am going to set her up with the DME for in person visit for mask fitting and asked her to bring all the masks she has tried -I encouraged her to change out her cushion every 4 weeks   Time Spent: 20 minutes total time of encounter, including 15 minutes spent in face-to-face patient care on the date of this encounter. This time includes coordination of care and counseling regarding above mentioned problem list. Remainder of non-face-to-face time involved reviewing chart documents/testing relevant to the patient encounter and documentation in the medical record. I have independently reviewed documentation from referring provider  Medication Adjustments/Labs and Tests Ordered: Current medicines are reviewed at length with the patient today.   Concerns regarding medicines are outlined above.  Medication changes, Labs and Tests ordered today are listed in the Patient Instructions below.  There are no Patient Instructions on file for this visit.   Signed, Armanda Magic, MD  03/03/2023 10:57 AM    [facility] [address] [place], [place], [place]  [address] Phone: [phone]; Fax: [phone]

## 2023-05-16 ENCOUNTER — Other Ambulatory Visit: Payer: Self-pay

## 2023-05-16 DIAGNOSIS — G4733 Obstructive sleep apnea (adult) (pediatric): Secondary | ICD-10-CM

## 2023-05-29 NOTE — Progress Notes (Signed)
Established Patient Office Visit   Subjective  Patient ID: Deborah Riddle, female    DOB: August 12, 1985  Age: 37 y.o. MRN: [id number]  Chief Complaint  Patient presents with   Follow-up    Back pain and pressure between shoulders  x 3 days skin issues.     She  has a past medical history of Body mass index 40.0-44.9, adult (HCC), Chest pain, unspecified, Dysrhythmia, GERD (gastroesophageal reflux disease), Morbid obesity (HCC), Paroxysmal atrial fibrillation (HCC), Polycystic disease, ovaries, Sickle cell trait (HCC), and Tobacco use disorder.  Patient presents with a new onset of back pain that is constant and progressively worsening since its onset. The pain is localized to the thoracic spine and is described as an aching sensation with a severity of 5/10. Symptoms are more pronounced during the day and are exacerbated by sitting. Pertinent negatives include the absence of bladder or bowel incontinence, leg pain, numbness, or weakness. Identified risk factors include poor posture, obesity, a sedentary lifestyle, and a lack of regular exercise. Patient has previously attempted treatment with NSAIDs and analgesics, which have provided no symptom relief.      Review of Systems  Constitutional:  Negative for chills and fever.  Eyes:  Negative for blurred vision.  Respiratory:  Negative for shortness of breath.   Cardiovascular:  Negative for chest pain.  Gastrointestinal:  Negative for abdominal pain.  Musculoskeletal:  Positive for back pain, joint pain and myalgias.  Neurological:  Negative for dizziness.      Objective:     BP 133/77   Pulse 73   Wt (!) 306 lb 0.6 oz (138.8 kg)   SpO2 95%   BMI 45.19 kg/m  BP Readings from Last 3 Encounters:  05/30/23 133/77  03/03/23 110/70  01/21/23 100/82      Physical Exam Vitals reviewed.  Constitutional:      General: She is not in acute distress.    Appearance: Normal appearance. She is not ill-appearing, toxic-appearing  or diaphoretic.  HENT:     Head: Normocephalic.  Eyes:     General:        Right eye: No discharge.        Left eye: No discharge.     Conjunctiva/sclera: Conjunctivae normal.  Cardiovascular:     Rate and Rhythm: Normal rate.     Pulses: Normal pulses.     Heart sounds: Normal heart sounds.  Pulmonary:     Effort: Pulmonary effort is normal. No respiratory distress.     Breath sounds: Normal breath sounds.  Musculoskeletal:        General: Normal range of motion.     Cervical back: Normal range of motion.  Skin:    General: Skin is warm and dry.     Capillary Refill: Capillary refill takes less than 2 seconds.  Neurological:     Mental Status: She is alert.     Coordination: Coordination normal.     Gait: Gait normal.  Psychiatric:        Mood and Affect: Mood normal.        Behavior: Behavior normal.      No results found for any visits on 05/30/23.  The ASCVD Risk score (Arnett DK, et al., 2019) failed to calculate for the following reasons:   The 2019 ASCVD risk score is only valid for ages 50 to 40    Assessment & Plan:  Iron deficiency anemia, unspecified iron deficiency anemia type -     CBC  with Differential/Platelet -     Iron, TIBC and Ferritin Panel -     Vitamin B12  Vitamin D deficiency -     VITAMIN D 25 Hydroxy (Vit-D Deficiency, Fractures)  Thoracic spine pain -     Cyclobenzaprine HCl; Take 1 tablet (5 mg total) by mouth 3 (three) times daily as needed.  Dispense: 30 tablet; Refill: 1 -     DG Thoracic Spine 2 View; Future  Acute midline thoracic back pain Assessment & Plan: Flexeril 5 PRN Xray ordered  We discussed the desired effects and potential side effects of the prescribed medication for back pain. Additionally, we reviewed non-pharmacological interventions, including the importance of rest, avoiding twisting, improper bending, and straining the lower back. I demonstrated proper body mechanics to prevent further injury and advised  alternating between ice and heat therapy for relief. Stretching exercises for both the back and legs were recommended to improve flexibility and support recovery. The patient was advised to follow up if symptoms worsen or persist. The patient expressed understanding of the treatment plan, and all questions were thoroughly addressed.      Return in 6 months (on 11/27/2023), or if symptoms worsen or fail to improve, for routine labs.   Cruzita Lederer Newman Nip, FNP

## 2023-05-29 NOTE — Patient Instructions (Addendum)
        Great to see you today.  I have refilled the medication(s) we provide.    Dermatology   # [phone]  [phone]   - Please take medications as prescribed. - Follow up with your primary health provider if any health concerns arises. - If symptoms worsen please contact your primary care provider and/or visit the emergency department.

## 2023-05-30 ENCOUNTER — Encounter: Payer: Self-pay | Admitting: Family Medicine

## 2023-05-30 ENCOUNTER — Ambulatory Visit: Payer: BC Managed Care – PPO | Admitting: Family Medicine

## 2023-05-30 VITALS — BP 133/77 | HR 73 | Wt 306.0 lb

## 2023-05-30 DIAGNOSIS — E559 Vitamin D deficiency, unspecified: Secondary | ICD-10-CM | POA: Diagnosis not present

## 2023-05-30 DIAGNOSIS — D509 Iron deficiency anemia, unspecified: Secondary | ICD-10-CM

## 2023-05-30 DIAGNOSIS — M546 Pain in thoracic spine: Secondary | ICD-10-CM

## 2023-05-30 DIAGNOSIS — M549 Dorsalgia, unspecified: Secondary | ICD-10-CM | POA: Insufficient documentation

## 2023-05-30 MED ORDER — CYCLOBENZAPRINE HCL 5 MG PO TABS
5.0000 mg | ORAL_TABLET | Freq: Three times a day (TID) | ORAL | 1 refills | Status: DC | PRN
Start: 2023-05-30 — End: 2023-07-23

## 2023-05-30 NOTE — Assessment & Plan Note (Signed)
Flexeril 5 PRN Xray ordered  We discussed the desired effects and potential side effects of the prescribed medication for back pain. Additionally, we reviewed non-pharmacological interventions, including the importance of rest, avoiding twisting, improper bending, and straining the lower back. I demonstrated proper body mechanics to prevent further injury and advised alternating between ice and heat therapy for relief. Stretching exercises for both the back and legs were recommended to improve flexibility and support recovery. The patient was advised to follow up if symptoms worsen or persist. The patient expressed understanding of the treatment plan, and all questions were thoroughly addressed.

## 2023-06-10 ENCOUNTER — Ambulatory Visit (INDEPENDENT_AMBULATORY_CARE_PROVIDER_SITE_OTHER): Payer: BC Managed Care – PPO | Admitting: Adult Health

## 2023-06-10 ENCOUNTER — Encounter (INDEPENDENT_AMBULATORY_CARE_PROVIDER_SITE_OTHER): Payer: Self-pay | Admitting: Adult Health

## 2023-06-10 VITALS — BP 125/78 | HR 57 | Temp 98.2°F | Ht 67.5 in | Wt 299.0 lb

## 2023-06-10 DIAGNOSIS — Z0289 Encounter for other administrative examinations: Secondary | ICD-10-CM

## 2023-06-10 DIAGNOSIS — G4733 Obstructive sleep apnea (adult) (pediatric): Secondary | ICD-10-CM | POA: Diagnosis not present

## 2023-06-10 DIAGNOSIS — Z6841 Body Mass Index (BMI) 40.0 and over, adult: Secondary | ICD-10-CM

## 2023-06-10 DIAGNOSIS — I4891 Unspecified atrial fibrillation: Secondary | ICD-10-CM | POA: Diagnosis not present

## 2023-06-10 NOTE — Progress Notes (Signed)
Office: [phone]  /  Fax: [phone]   Initial Visit  Deborah Riddle was seen in clinic today to evaluate for obesity. She is interested in losing weight to improve overall health and reduce the risk of weight related complications. She presents today to review program treatment options, initial physical assessment, and evaluation.     She was referred by: Specialist  When asked what else they would like to accomplish? She states: Adopt healthier eating patterns, Improve energy levels and physical activity, Improve existing medical conditions, Reduce number of medications, and Improve quality of life  Weight history: She reports weight has been an issue "her entire life".  Her mother passed in 2021- she experienced 50 lb weight gain after that loss.  When asked how has your weight affected you? She states: Contributed to medical problems, Having fatigue, Having poor endurance, and Problems with eating patterns  Some associated conditions: OSA and PCOS  Contributing factors: Family history of obesity, Reduced physical activity, and Eating patterns  Weight promoting medications identified: Beta-blockers  Current nutrition plan: None  Current level of physical activity: Walking  Current or previous pharmacotherapy: Metformin  Response to medication: Lost weight and was able to maintain weight loss   Past medical history includes:   Past Medical History:  Diagnosis Date   Body mass index 40.0-44.9, adult (HCC)    Chest pain, unspecified    Dysrhythmia    AFib   GERD (gastroesophageal reflux disease)    Morbid obesity (HCC)    Paroxysmal atrial fibrillation (HCC)    Polycystic disease, ovaries    Sickle cell trait (HCC)    Tobacco use disorder      Objective:   There were no vitals taken for this visit. She was weighed on the bioimpedance scale: There is no height or weight on file to calculate BMI.  Peak Weight:314 , Body Fat%:48.5, Visceral Fat Rating:15,  Weight trend over the last 12 months: Increasing/Decreasing  General:  Alert, oriented and cooperative. Patient is in no acute distress.  Respiratory: Normal respiratory effort, no problems with respiration noted   Gait: able to ambulate independently  Mental Status: Normal mood and affect. Normal behavior. Normal judgment and thought content.   DIAGNOSTIC DATA REVIEWED:  BMET    Component Value Date/Time   NA 138 11/05/2022 0440   NA 141 10/07/2022 0916   K 3.7 11/05/2022 0440   CL 105 11/05/2022 0440   CO2 25 11/05/2022 0440   GLUCOSE 111 (H) 11/05/2022 0440   BUN 12 11/05/2022 0440   BUN 11 10/07/2022 0916   CREATININE 0.90 11/05/2022 0440   CALCIUM 8.6 (L) 11/05/2022 0440   GFRNONAA >60 11/05/2022 0440   GFRAA >60 12/01/2019 2244   Lab Results  Component Value Date   HGBA1C 5.6 10/07/2022   HGBA1C 5.6 10/25/2021   No results found for: "INSULIN" CBC    Component Value Date/Time   WBC 9.3 11/05/2022 0440   RBC 5.68 (H) 11/05/2022 0440   HGB 12.3 11/05/2022 0440   HGB 10.9 (L) 10/07/2022 0916   HCT 38.3 11/05/2022 0440   HCT 33.4 (L) 10/07/2022 0916   PLT  11/05/2022 0440    PLATELET CLUMPS NOTED ON SMEAR, COUNT APPEARS ADEQUATE   PLT CANCELED 10/07/2022 0916   MCV 67.4 (L) 11/05/2022 0440   MCV 68 (L) 10/07/2022 0916   MCH 21.7 (L) 11/05/2022 0440   MCHC 32.1 11/05/2022 0440   RDW 21.5 (H) 11/05/2022 0440   RDW 20.1 (H) 10/07/2022  7846   Iron/TIBC/Ferritin/ %Sat No results found for: "IRON", "TIBC", "FERRITIN", "IRONPCTSAT" Lipid Panel     Component Value Date/Time   CHOL 192 10/07/2022 0916   TRIG 87 10/07/2022 0916   HDL 54 10/07/2022 0916   CHOLHDL 3.6 10/07/2022 0916   LDLCALC 122 (H) 10/07/2022 0916   Hepatic Function Panel     Component Value Date/Time   PROT 6.6 10/07/2022 0916   ALBUMIN 3.9 10/07/2022 0916   AST 20 10/07/2022 0916   ALT 15 10/07/2022 0916   ALKPHOS 101 10/07/2022 0916   BILITOT 0.2 10/07/2022 0916      Component Value  Date/Time   TSH 2.070 10/07/2022 0916     Assessment and Plan:   OSA (obstructive sleep apnea)  Atrial fibrillation, unspecified type (HCC)  Morbid obesity (HCC), Starting BMI  ESTABLISH WITH HWW   Obesity Treatment / Action Plan:  Patient will work on garnering support from family and friends to begin weight loss journey. Will work on eliminating or reducing the presence of highly palatable, calorie dense foods in the home. Will complete provided nutritional and psychosocial assessment questionnaire before the next appointment. Will be scheduled for indirect calorimetry to determine resting energy expenditure in a fasting state.  This will allow [place] to create a reduced calorie, high-protein meal plan to promote loss of fat mass while preserving muscle mass. Counseled on the health benefits of losing 5%-15% of total body weight. Was counseled on nutritional approaches to weight loss and benefits of reducing processed foods and consuming plant-based foods and high quality protein as part of nutritional weight management. Was counseled on pharmacotherapy and role as an adjunct in weight management.   Obesity Education Performed Today:  She was weighed on the bioimpedance scale and results were discussed and documented in the synopsis.  We discussed obesity as a disease and the importance of a more detailed evaluation of all the factors contributing to the disease.  We discussed the importance of long term lifestyle changes which include nutrition, exercise and behavioral modifications as well as the importance of customizing this to her specific health and social needs.  We discussed the benefits of reaching a healthier weight to alleviate the symptoms of existing conditions and reduce the risks of the biomechanical, metabolic and psychological effects of obesity.  LAJOY HELLAMS appears to be in the action stage of change and states they are ready to start intensive lifestyle  modifications and behavioral modifications.  30 minutes was spent today on this visit including the above counseling, pre-visit chart review, and post-visit documentation.  Reviewed by clinician on day of visit: allergies, medications, problem list, medical history, surgical history, family history, social history, and previous encounter notes pertinent to obesity diagnosis.   Allee Busk d. Tibor Lemmons, NP-C

## 2023-06-19 DIAGNOSIS — G4733 Obstructive sleep apnea (adult) (pediatric): Secondary | ICD-10-CM | POA: Diagnosis not present

## 2023-07-05 DIAGNOSIS — G4733 Obstructive sleep apnea (adult) (pediatric): Secondary | ICD-10-CM | POA: Diagnosis not present

## 2023-07-23 ENCOUNTER — Ambulatory Visit (INDEPENDENT_AMBULATORY_CARE_PROVIDER_SITE_OTHER): Payer: 59 | Admitting: Family Medicine

## 2023-07-23 ENCOUNTER — Encounter (INDEPENDENT_AMBULATORY_CARE_PROVIDER_SITE_OTHER): Payer: Self-pay | Admitting: Family Medicine

## 2023-07-23 VITALS — BP 119/84 | HR 77 | Temp 98.2°F | Ht 68.0 in | Wt 306.0 lb

## 2023-07-23 DIAGNOSIS — I480 Paroxysmal atrial fibrillation: Secondary | ICD-10-CM | POA: Diagnosis not present

## 2023-07-23 DIAGNOSIS — D573 Sickle-cell trait: Secondary | ICD-10-CM

## 2023-07-23 DIAGNOSIS — R0602 Shortness of breath: Secondary | ICD-10-CM

## 2023-07-23 DIAGNOSIS — Z1331 Encounter for screening for depression: Secondary | ICD-10-CM | POA: Diagnosis not present

## 2023-07-23 DIAGNOSIS — E785 Hyperlipidemia, unspecified: Secondary | ICD-10-CM | POA: Insufficient documentation

## 2023-07-23 DIAGNOSIS — E282 Polycystic ovarian syndrome: Secondary | ICD-10-CM | POA: Diagnosis not present

## 2023-07-23 DIAGNOSIS — E559 Vitamin D deficiency, unspecified: Secondary | ICD-10-CM

## 2023-07-23 DIAGNOSIS — R5383 Other fatigue: Secondary | ICD-10-CM

## 2023-07-23 DIAGNOSIS — E7800 Pure hypercholesterolemia, unspecified: Secondary | ICD-10-CM

## 2023-07-23 DIAGNOSIS — G4733 Obstructive sleep apnea (adult) (pediatric): Secondary | ICD-10-CM

## 2023-07-23 DIAGNOSIS — Z6841 Body Mass Index (BMI) 40.0 and over, adult: Secondary | ICD-10-CM

## 2023-07-23 NOTE — Assessment & Plan Note (Signed)
 Previously elevated LDL.  Triglycerides were normal.  FLP today.

## 2023-07-23 NOTE — Assessment & Plan Note (Signed)
 Prior diagnosis.  Her MCV was previously 67.  CBC ordered for today.

## 2023-07-23 NOTE — Assessment & Plan Note (Signed)
 History of low levels.  On Rx supplementation weekly.  Unsure of last level.  Vitamin d level ordered today.

## 2023-07-23 NOTE — Assessment & Plan Note (Signed)
 Diagnosed about a year or two ago.  She wears a CPAP nightly.  She said this could have been a contributing factor to her atrial fibrillation.  She sees Dr. Mayford Knife

## 2023-07-23 NOTE — Assessment & Plan Note (Signed)
 Patient was diagnosed in her teens.  She has been on metformin on and off throughout that time.  She was up to 2k a day during her fertility treatments. She is currently on 500mg  BID.

## 2023-07-23 NOTE — Assessment & Plan Note (Signed)
 Patient has symptoms when she drinks too much coffee or tea.  She sees Dr. Elease Hashimoto.  Not on blood thinner

## 2023-07-23 NOTE — Progress Notes (Signed)
 Chief Complaint:  Obesity   Subjective:  Deborah Riddle (MR# [id number]) is a 38 y.o. female who presents for evaluation and treatment of obesity and related comorbidities.   Deborah Riddle is currently in the action stage of change and ready to dedicate time achieving and maintaining a healthier weight. Deborah Riddle is interested in becoming our patient and working on intensive lifestyle modifications including (but not limited to) diet and exercise for weight loss.  Deborah Riddle has been struggling with her weight. She has been unsuccessful in either losing weight, maintaining weight loss, or reaching her healthy weight goal.  Patient googled a wellness center and that's how she found us .  She is an chartered certified accountant for [employer].  She works 40 hours a week- 8-5.  She lives at home with her husband and son.   Deborah Riddle habits were reviewed today and are as follows: Her family eats meals together, her desired weight loss is 40-60lns, she skips meals frequently, and she has problems with excessive hunger.  She gained a significant amount of weight after losing her mother and unfortunately gained it all back.  Patient eats out fast food or takeout 5 times a week.  She may go to [facility] or get italian or pizza or wings. Skips breakfast 3-5 times a week.  She mentions she doesn't prep much.   Food Recall: Chicken, cheese and honey and hash brown casserole biscuit from [employer].  Feels too full.  Will order orange juice and drink all of it.  Eats again at 1pm and is hungry.  Will eat salad from [employer] or subway or a sub (turkey or meatballs- 6 inch).  Feels full.  Will get lemonade and drink all of it.  May eat again at 7:30pm.  May have sandwiches or chicken livers and gravy meal with bread (will eat whole meal and feel full).  Will drink water . May have something to eat after  dinner like cereal, bowl of chips.    Indirect Calorimeter completed today shows a No data recorded . Her calculated basal  metabolic rate is 7781 thus her basal metabolic rate is worse than expected.  Other Fatigue Deborah Riddle denies daytime somnolence and denies waking up still tired. Patient has a history of symptoms of daytime fatigue. Deborah Riddle generally gets 7 hours of sleep per night, and states that she has difficulty falling asleep. Snoring is present. Apneic episodes are present. Epworth Sleepiness Score is 2.  Patient is currently being treated with CPAP therapy for OSA.  Shortness of Breath Deborah Riddle notes increasing shortness of breath with exercising and seems to be worsening over time with weight gain. She notes getting out of breath sooner with activity than she used to. This has gotten worse recently. Deborah Riddle denies shortness of breath at rest or orthopnea.  Depression Screen Deborah Riddle's Food and Mood (modified PHQ-9) score was 11.     05/30/2023    3:36 PM  Depression screen PHQ 2/9  Decreased Interest 0  Down, Depressed, Hopeless 0  PHQ - 2 Score 0     Objective:  No data recorded  No data recorded  No data recorded  No data recorded   EKG: Normal sinus rhythm, rate 84 on EKG done 11/06/22.  T wave abnormality noted on lateral leads.  General: Cooperative, alert, well developed, in no acute distress. HEENT: Conjunctivae and lids unremarkable. Cardiovascular: Regular rhythm.  Lungs: Normal work of breathing. Neurologic: No focal deficits.   Lab Results  Component Value Date   CREATININE 0.82 07/23/2023  BUN 11 07/23/2023   NA 139 07/23/2023   K 4.4 07/23/2023   CL 104 07/23/2023   CO2 23 07/23/2023   Lab Results  Component Value Date   ALT 12 07/23/2023   AST 15 07/23/2023   ALKPHOS 111 07/23/2023   BILITOT 0.2 07/23/2023   Lab Results  Component Value Date   HGBA1C 5.8 (H) 07/23/2023   HGBA1C 5.6 10/07/2022   HGBA1C 5.6 10/25/2021   Lab Results  Component Value Date   INSULIN  20.8 07/23/2023   Lab Results  Component Value Date   TSH 2.030 07/23/2023   Lab  Results  Component Value Date   CHOL 212 (H) 07/23/2023   HDL 59 07/23/2023   LDLCALC 140 (H) 07/23/2023   TRIG 75 07/23/2023   CHOLHDL 3.6 10/07/2022   Lab Results  Component Value Date   WBC 6.9 07/23/2023   HGB 12.7 07/23/2023   HCT 40.8 07/23/2023   MCV 73 (L) 07/23/2023   PLT CANCELED 07/23/2023   No results found for: IRON, TIBC, FERRITIN  Assessment and Plan:   Other Fatigue  Deborah Riddle does feel that her weight is causing her energy to be lower than it should be. Fatigue may be related to obesity, depression or many other causes. Labs will be ordered, and in the meanwhile, Deborah Riddle will focus on self care including making healthy food choices, increasing physical activity and focusing on stress reduction.  Shortness of Breath  Deborah Riddle does feel that she gets out of breath more easily that she used to when she exercises. 's shortness of breath appears to be obesity related and exercise induced. She has agreed to work on weight loss and gradually increase exercise to treat her exercise induced shortness of breath. Will continue to monitor closely.  Deborah Riddle had a positive depression screening. Depression is commonly associated with obesity and often results in emotional eating behaviors. We will monitor this closely and work on CBT to help improve the non-hunger eating patterns. Referral to Psychology may be required if no improvement is seen as she continues in our clinic.    Problem List Items Addressed This Visit       Cardiovascular and Mediastinum   Paroxysmal atrial fibrillation (HCC) (Chronic)   Patient has symptoms when she drinks too much coffee or tea.  She sees Dr. Alveta.  Not on blood thinner        Respiratory   OSA (obstructive sleep apnea)   Diagnosed about a year or two ago.  She wears a CPAP nightly.  She said this could have been a contributing factor to her atrial fibrillation.  She sees Dr. Shlomo        Endocrine   Polycystic disease,  ovaries - Primary   Patient was diagnosed in her teens.  She has been on metformin  on and off throughout that time.  She was up to 2k a day during her fertility treatments. She is currently on 500mg  BID.      Relevant Orders   Comprehensive metabolic panel (Completed)   Hemoglobin A1c (Completed)   Insulin , random (Completed)     Other   Morbid obesity (HCC)   Sickle cell trait (HCC)   Prior diagnosis.  Her MCV was previously 67.  CBC ordered for today.      Relevant Orders   Vitamin B12 (Completed)   CBC with Differential/Platelet (Completed)   Folate (Completed)   Vitamin D  deficiency   History of low levels.  On Rx supplementation weekly.  Unsure of last level.  Vitamin d  level ordered today.      Relevant Orders   VITAMIN D  25 Hydroxy (Vit-D Deficiency, Fractures) (Completed)   Hyperlipidemia   Previously elevated LDL.  Triglycerides were normal.  FLP today.      Relevant Orders   Lipid Panel With LDL/HDL Ratio (Completed)   Other Visit Diagnoses       Other fatigue       Relevant Orders   T4, free (Completed)   T3 (Completed)   TSH (Completed)     Shortness of breath on exertion         BMI 45.0-49.9, adult (HCC)           Deborah Riddle is currently in the action stage of change and her goal is to continue with weight loss efforts. I recommend Deborah Riddle begin the structured treatment plan as follows:  She has agreed to Category 4 Plan  Exercise goals: No exercise has been prescribed at this time.  Behavioral modification strategies:increasing lean protein intake, decreasing simple carbohydrates, increasing vegetables, and meal planning and cooking strategies  She was informed of the importance of frequent follow-up visits to maximize her success with intensive lifestyle modifications for her multiple health conditions. She was informed we would discuss her lab results at her next visit unless there is a critical issue that needs to be addressed sooner. Deborah Riddle  agreed to keep her next visit at the agreed upon time to discuss these results.   Attestation Statements:  Reviewed by clinician on day of visit: allergies, medications, problem list, medical history, surgical history, family history, social history, and previous encounter notes.  Time spent on visit including pre-visit chart review and post-visit charting and care was 50 minutes.   Deborah Cho, MD

## 2023-07-24 LAB — CBC WITH DIFFERENTIAL/PLATELET
Basophils Absolute: 0 10*3/uL (ref 0.0–0.2)
Basos: 0 %
EOS (ABSOLUTE): 0.1 10*3/uL (ref 0.0–0.4)
Eos: 1 %
Hematocrit: 40.8 % (ref 34.0–46.6)
Hemoglobin: 12.7 g/dL (ref 11.1–15.9)
Immature Grans (Abs): 0 10*3/uL (ref 0.0–0.1)
Immature Granulocytes: 1 %
Lymphocytes Absolute: 2.1 10*3/uL (ref 0.7–3.1)
Lymphs: 30 %
MCH: 22.8 pg — ABNORMAL LOW (ref 26.6–33.0)
MCHC: 31.1 g/dL — ABNORMAL LOW (ref 31.5–35.7)
MCV: 73 fL — ABNORMAL LOW (ref 79–97)
Monocytes Absolute: 0.4 10*3/uL (ref 0.1–0.9)
Monocytes: 5 %
Neutrophils Absolute: 4.3 10*3/uL (ref 1.4–7.0)
Neutrophils: 63 %
RBC: 5.56 x10E6/uL — ABNORMAL HIGH (ref 3.77–5.28)
RDW: 19.3 % — ABNORMAL HIGH (ref 11.7–15.4)
WBC: 6.9 10*3/uL (ref 3.4–10.8)

## 2023-07-24 LAB — COMPREHENSIVE METABOLIC PANEL
ALT: 12 [IU]/L (ref 0–32)
AST: 15 [IU]/L (ref 0–40)
Albumin: 4.1 g/dL (ref 3.9–4.9)
Alkaline Phosphatase: 111 [IU]/L (ref 44–121)
BUN/Creatinine Ratio: 13 (ref 9–23)
BUN: 11 mg/dL (ref 6–20)
Bilirubin Total: 0.2 mg/dL (ref 0.0–1.2)
CO2: 23 mmol/L (ref 20–29)
Calcium: 9 mg/dL (ref 8.7–10.2)
Chloride: 104 mmol/L (ref 96–106)
Creatinine, Ser: 0.82 mg/dL (ref 0.57–1.00)
Globulin, Total: 3 g/dL (ref 1.5–4.5)
Glucose: 83 mg/dL (ref 70–99)
Potassium: 4.4 mmol/L (ref 3.5–5.2)
Sodium: 139 mmol/L (ref 134–144)
Total Protein: 7.1 g/dL (ref 6.0–8.5)
eGFR: 93 mL/min/{1.73_m2} (ref >59)

## 2023-07-24 LAB — LIPID PANEL WITH LDL/HDL RATIO
Cholesterol, Total: 212 mg/dL — ABNORMAL HIGH (ref 100–199)
HDL: 59 mg/dL (ref >39)
LDL Chol Calc (NIH): 140 mg/dL — ABNORMAL HIGH (ref 0–99)
LDL/HDL Ratio: 2.4 {ratio} (ref 0.0–3.2)
Triglycerides: 75 mg/dL (ref 0–149)
VLDL Cholesterol Cal: 13 mg/dL (ref 5–40)

## 2023-07-24 LAB — T4, FREE: Free T4: 1.04 ng/dL (ref 0.82–1.77)

## 2023-07-24 LAB — HEMOGLOBIN A1C
Est. average glucose Bld gHb Est-mCnc: 120 mg/dL
Hgb A1c MFr Bld: 5.8 % — ABNORMAL HIGH (ref 4.8–5.6)

## 2023-07-24 LAB — VITAMIN D 25 HYDROXY (VIT D DEFICIENCY, FRACTURES): Vit D, 25-Hydroxy: 52.1 ng/mL (ref 30.0–100.0)

## 2023-07-24 LAB — VITAMIN B12: Vitamin B-12: 362 pg/mL (ref 232–1245)

## 2023-07-24 LAB — FOLATE: Folate: 11.9 ng/mL (ref >3.0)

## 2023-07-24 LAB — TSH: TSH: 2.03 u[IU]/mL (ref 0.450–4.500)

## 2023-07-24 LAB — INSULIN, RANDOM: INSULIN: 20.8 u[IU]/mL (ref 2.6–24.9)

## 2023-07-24 LAB — T3: T3, Total: 152 ng/dL (ref 71–180)

## 2023-08-06 ENCOUNTER — Ambulatory Visit (INDEPENDENT_AMBULATORY_CARE_PROVIDER_SITE_OTHER): Payer: 59 | Admitting: Family Medicine

## 2023-08-06 DIAGNOSIS — E559 Vitamin D deficiency, unspecified: Secondary | ICD-10-CM | POA: Diagnosis not present

## 2023-08-06 DIAGNOSIS — R7303 Prediabetes: Secondary | ICD-10-CM

## 2023-08-06 DIAGNOSIS — E7800 Pure hypercholesterolemia, unspecified: Secondary | ICD-10-CM

## 2023-08-06 DIAGNOSIS — G4733 Obstructive sleep apnea (adult) (pediatric): Secondary | ICD-10-CM

## 2023-08-06 DIAGNOSIS — E785 Hyperlipidemia, unspecified: Secondary | ICD-10-CM

## 2023-08-06 DIAGNOSIS — Z6841 Body Mass Index (BMI) 40.0 and over, adult: Secondary | ICD-10-CM

## 2023-08-06 NOTE — Assessment & Plan Note (Signed)

## 2023-08-06 NOTE — Assessment & Plan Note (Addendum)
Starting weight: 306lb on 07/23/23 Peak weight: 314 BMR: 2174 on 07/23/23 Previous obesity management:  Body Fat %: 50 on 07/23/23 Starting Meal Plan: Category 4 Meal Plan needs:  Likes/ Dislikes of meal plan: likes structure and food ideas

## 2023-08-06 NOTE — Assessment & Plan Note (Signed)
Discussed importance of vitamin d supplementation.  Vitamin d supplementation has been shown to decrease fatigue, decrease risk of progression to insulin resistance and then prediabetes, decreases risk of falling in older age and can even assist in decreasing depressive symptoms in PTSD.   Patient to transition to [facility] international units daily.

## 2023-08-06 NOTE — Progress Notes (Signed)
SUBJECTIVE:  Chief Complaint: Obesity  Interim History: Patient has been trying to be more thoughtful over the last few weeks.  She is realizing this program will take work. She is making plans to slowly implement more of the food choices from the meal plan.  Deborah Riddle is here to discuss her progress with her obesity treatment plan. She is on the Category 4 Plan and states she is following her eating plan approximately 35 % of the time. She states she is not exercising.   OBJECTIVE: Visit Diagnoses: Problem List Items Addressed This Visit       Respiratory   OSA (obstructive sleep apnea)   Discussed with patient today that Zepbound has been FDA approved for the treatment of sleep apnea.  I discussed risks, benefits, and adverse effects that could happen with medication usage.  We discussed the longevity of Zepbound usage and that current data does not show that cessation of this medication would lead ultimately to weight loss maintenance or control of her sleep apnea.  She will contemplate medication usage and we will revisit this at a later appointment.        Other   Morbid obesity (HCC) - Primary   Starting weight: 306lb on 07/23/23 Peak weight: 314 BMR: 2174 on 07/23/23 Previous obesity management:  Body Fat %: 50 on 07/23/23 Starting Meal Plan: Category 4 Meal Plan needs:  Likes/ Dislikes of meal plan: likes structure and food ideas       Vitamin D deficiency   Discussed importance of vitamin d supplementation.  Vitamin d supplementation has been shown to decrease fatigue, decrease risk of progression to insulin resistance and then prediabetes, decreases risk of falling in older age and can even assist in decreasing depressive symptoms in PTSD.   Patient to transition to [facility] international units daily.         Hyperlipidemia   Prediabetes   Pathophysiology of progression through insulin resistance to prediabetes and diabetes was discussed at length today.  Patient to continue  to monitor and be in control of total intake of snack calories which may be simple carbohydrates but should be consumed only after the patient has taken in all the nutrition for the day.  Macronutrient identification, classification and daily intake ratios were discussed.  Plan to repeat labs in 3 months to monitor both hemoglobin A1c and insulin levels.  No medications at this time as patient is not having significant hunger or cravings that would make following meal plan more difficult.         Other Visit Diagnoses       BMI 45.0-49.9, adult (HCC)           No data recorded  No data recorded  No data recorded  No data recorded    08/06/2023   11:00 AM 07/23/2023    7:00 AM 06/10/2023   12:00 PM  Vitals with BMI  Height 5\' 8"  5\' 8"  5' 7.5"  Weight 304 lbs 306 lbs 299 lbs  BMI 46.23 46.54 46.11  Systolic 131 119 536  Diastolic 73 84 78  Pulse 69 77 57      ASSESSMENT AND PLAN:  Diet: Deborah Riddle is currently in the action stage of change. As such, her goal is to continue with weight loss efforts. She has agreed to Category 4 Plan.  Exercise: Deborah Riddle has been instructed that some exercise is better than none for weight loss and overall health benefits.   Behavior Modification:  We discussed the  following Behavioral Modification Strategies today: increasing lean protein intake, increasing vegetables, decrease ETOH, no skipping meals, meal planning and cooking strategies, avoiding temptations, and planning for success.   No follow-ups on file.Marland Kitchen She was informed of the importance of frequent follow up visits to maximize her success with intensive lifestyle modifications for her multiple health conditions.  Attestation Statements:   Reviewed by clinician on day of visit: allergies, medications, problem list, medical history, surgical history, family history, social history, and previous encounter notes.   Time spent on visit including pre-visit chart review and post-visit  care and charting was 45 minutes.    Reuben Likes, MD

## 2023-08-13 NOTE — Assessment & Plan Note (Signed)
Discussed with patient today that Zepbound has been FDA approved for the treatment of sleep apnea.  I discussed risks, benefits, and adverse effects that could happen with medication usage.  We discussed the longevity of Zepbound usage and that current data does not show that cessation of this medication would lead ultimately to weight loss maintenance or control of her sleep apnea.  She will contemplate medication usage and we will revisit this at a later appointment.

## 2023-08-19 ENCOUNTER — Telehealth (INDEPENDENT_AMBULATORY_CARE_PROVIDER_SITE_OTHER): Payer: 59 | Admitting: Psychology

## 2023-08-19 DIAGNOSIS — F5089 Other specified eating disorder: Secondary | ICD-10-CM

## 2023-08-19 NOTE — Progress Notes (Signed)
 Office: [phone]  /  Fax: [phone]    Date: August 19, 2023    Appointment Start Time: 2:49pm Duration: 48 minutes Provider: Wyatt Fire, Psy.D. Type of Session: Intake for Individual Therapy  Location of Patient: Parked in car at work (address obtained; private location) Location of Provider: Provider's home (private office) Type of Contact: Telepsychological Visit via MyChart Video Visit  Informed Consent: Prior to proceeding with today's appointment, two pieces of identifying information were obtained. In addition, Deborah Riddle's physical location at the time of this appointment was obtained as well a phone number she could be reached at in the event of technical difficulties. Deborah Riddle and this provider participated in today's telepsychological service.   The provider's role was explained to Deborah Riddle. The provider reviewed and discussed issues of confidentiality, privacy, and limits therein (e.g., reporting obligations). In addition to verbal informed consent, written informed consent for psychological services was obtained prior to the initial appointment. Since the clinic is not a 24/7 crisis center, mental health emergency resources were shared and this  provider explained MyChart, e-mail, voicemail, and/or other messaging systems should be utilized only for non-emergency reasons. This provider also explained that information obtained during appointments will be placed in Deborah Riddle's medical record and relevant information will be shared with other providers at [facility] at any locations for coordination of care. Deborah Riddle agreed information may be shared with other [facility] providers as needed for coordination of care and by signing the service agreement document, she provided written consent for coordination of care. Prior to initiating telepsychological services, Deborah Riddle completed an informed consent document, which included the development  of a safety plan (i.e., an emergency contact and emergency resources) in the event of an emergency/crisis. Deborah Riddle verbally acknowledged understanding she is ultimately responsible for understanding her insurance benefits for telepsychological and in-person services. This provider also reviewed confidentiality, as it relates to telepsychological services. Deborah Riddle  acknowledged understanding that appointments cannot be recorded without both party consent and she is aware she is responsible for securing confidentiality on her end of the session. Deborah Riddle verbally consented to proceed.  Chief Complaint/HPI: Deborah Riddle was referred by Deborah Riddle on 08/06/2023.  During today's appointment, Deborah Riddle reported, I want to do all that I can to be successful at this attempt to change my lifestyle. She disclosed gaining a large amount of weight after her mother's passing in 2001. She was verbally administered a questionnaire assessing various behaviors related to emotional eating behaviors. Deborah Riddle endorsed the following: overeat when you are celebrating, experience food cravings on a regular basis, use food to help you cope with emotional situations, find food is comforting to you, overeat when you are angry or upset, overeat when you are worried about something, overeat frequently when you are bored or lonely, not worry about what you eat when you are in a good mood, and eat as a reward. She shared she craves pasta, breads, sugar, and carbs. Deborah Riddle believes the onset of emotional eating behaviors was likely in childhood and described the current frequency of emotional eating behaviors as weekly. In addition, Deborah Riddle denied a history of binge eating behaviors; however, discussed overeating during the holidays. Deborah Riddle denied a history of significantly restricting food intake, purging and engagement in other compensatory strategies for weight loss, and has never been diagnosed with an eating disorder.  She also denied a history of treatment for emotional eating behaviors. Currently, Deborah Riddle indicated she is prescribed structured meal plan (Cat .4), noting, I  need to get myself together. She explained she is working on getting more organized. Furthermore, Deborah Riddle disclosed chewing on wood (e.g., toothpick, milk stirrer) and plastic straw starting in childhood, noting she does not ingest the wood or straw. She described the frequency as twice a week, adding she has never shared the aforementioned with a medical provider but agreed to discuss with Dr. Berkeley.   Mental Status Examination:  Appearance: neat Behavior: appropriate to circumstances Mood: neutral Affect: mood congruent Speech: WNL Eye Contact: appropriate Psychomotor Activity: WNL Gait: unable to assess  Thought Process: linear, logical, and goal directed and denies suicidal, homicidal, and self-harm ideation, plan and intent  Thought Content/Perception: no hallucinations, delusions, bizarre thinking or behavior endorsed or observed Orientation: AAOx4 Memory/Concentration: intact Insight/Judgment: fair  Family & Psychosocial History: Deborah Riddle reported she is married and she has one son (age 58). 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 Additionally, Deborah Riddle shared her highest level of education obtained is a bachelor's degree. Currently, Deborah Riddle social support system consists of her two close friends, production designer, theatre/television/film, and husband. Moreover, Deborah Riddle stated she resides with her husband and son.   Medical History:  Past Medical History:  Diagnosis Date   Anemia    Back pain    Body mass index 40.0-44.9, adult (HCC)    Chest pain    Chest pain, unspecified    Constipation    Dysrhythmia    AFib   GERD (gastroesophageal reflux disease)    Infertility, female    Joint pain    Morbid obesity (HCC)    Palpitations    Paroxysmal atrial fibrillation (HCC)    Polycystic disease,  ovaries    Sickle cell trait (HCC)    Sleep apnea    Tobacco use disorder    Vitamin D  deficiency    Past Surgical History:  Procedure Laterality Date   BIOPSY  08/07/2018   Procedure: BIOPSY;  Surgeon: Harvey Margo CROME, MD;  Location: [facility];  Service: Endoscopy;;  gastric bx's   DILATION AND EVACUATION  07/07/2020   Procedure: DILATATION AND EVACUATION;  Surgeon: Rutherford Gain, MD;  Location: [facility];  Service: Gynecology;;   ESOPHAGOGASTRODUODENOSCOPY N/A 08/07/2018   Procedure: ESOPHAGOGASTRODUODENOSCOPY (EGD);  Surgeon: Harvey Margo CROME, MD;  Location: [facility];  Service: Endoscopy;  Laterality: N/A;  3:00pm   HYDRADENITIS EXCISION Right 05/29/2018   Procedure: EXCISION HIDRADENITIS AXILLA;  Surgeon: Kallie Manuelita BROCKS, MD;  Location: [facility];  Service: General;  Laterality: Right;   MALONEY DILATION N/A 08/07/2018   Procedure: AGAPITO HODGKIN;  Surgeon: Harvey Margo CROME, MD;  Location: [facility];  Service: Endoscopy;  Laterality: N/A;   none     SAVORY DILATION N/A 08/07/2018   Procedure: SAVORY DILATION;  Surgeon: Harvey Margo CROME, MD;  Location: [facility];  Service: Endoscopy;  Laterality: N/A;   WISDOM TOOTH EXTRACTION     Current Outpatient Medications on File Prior to Visit  Medication Sig Dispense Refill   metFORMIN  (GLUCOPHAGE ) 500 MG tablet Take 1 tablet (500 mg total) by mouth 2 (two) times daily with a meal. 180 tablet 1   propranolol  (INDERAL ) 10 MG tablet Take 1 tablet (10 mg total) by mouth 4 (four) times daily as needed (palpitations). 120 tablet 5   Vitamin D , Ergocalciferol , (DRISDOL) 50000 UNITS CAPS capsule Take 50,000 Units by mouth every Saturday.     No current facility-administered medications on file prior to visit.  Zakiah stated she is  medication compliant.   Mental Health History: Kenadie reported she attended therapeutic services after losing her mother for a short period of time. She recalled she was prescribed an unknown  psychotropic medication by her PCP after her mother's passing, noting she did not take it. Cela reported there is no history of hospitalizations for psychiatric concerns. Subrena denied a family history of mental health/substance abuse related concerns. Furthermore, Jadea reported there is no history of trauma including psychological, physical , and sexual abuse, as well as neglect.   Claribel described her typical mood lately as very balanced. Delany denied current alcohol use. She denied current tobacco use. She denied illicit/recreational substance use. Furthermore, Michael indicated she is not experiencing the following: hallucinations and delusions, paranoia, symptoms of mania , social withdrawal, crying spells, panic attacks, memory concerns, attention and concentration issues, and obsessions and compulsions. She also denied history of and current suicidal ideation, plan, and intent; history of and current homicidal ideation, plan, and intent; and history of and current engagement in self-harm.  Legal History: Beverlie reported there is no history of legal involvement.   Structured Assessments Results: The Patient Health Questionnaire-9 (PHQ-9) is a self-report measure that assesses symptoms and severity of depression over the course of the last two weeks. Undrea obtained a score of 1 suggesting minimal depression. Kaisha finds the endorsed symptoms to be somewhat difficult. [0= Not at all; 1= Several days; 2= More than half the days; 3= Nearly every day] Little interest or pleasure in doing things 0  Feeling down, depressed, or hopeless 0  Trouble falling or staying asleep, or sleeping too much 0  Feeling tired or having little energy 1  Poor appetite or overeating 0  Feeling bad about yourself --- or that you are a failure or have let yourself or your family down 0  Trouble concentrating on things, such as reading the newspaper or watching television 0  Moving or speaking so  slowly that other people could have noticed? Or the opposite --- being so fidgety or restless that you have been moving around a lot more than usual 0  Thoughts that you would be better off dead or hurting yourself in some way 0  PHQ-9 Score 1    The Generalized Anxiety Disorder-7 (GAD-7) is a brief self-report measure that assesses symptoms of anxiety over the course of the last two weeks. Julien obtained a score of 0. [0= Not at all; 1= Several days; 2= Over half the days; 3= Nearly every day] Feeling nervous, anxious, on edge 0  Not being able to stop or control worrying 0  Worrying too much about different things 0  Trouble relaxing 0  Being so restless that it's hard to sit still 0  Becoming easily annoyed or irritable 0  Feeling afraid as if something awful might happen 0  GAD-7 Score 0   Interventions:  Conducted a chart review Focused on rapport building Verbally administered PHQ-9 and GAD-7 for symptom monitoring Verbally administered Food & Mood questionnaire to assess various behaviors related to emotional eating Provided emphatic reflections and validation Psychoeducation provided regarding physical versus emotional hunger  Diagnostic Impressions & Provisional DSM-5 Diagnosis(es): Kyrsten reported a history of engaging in emotional eating behaviors starting in childhood, noting the current frequency as weekly. She denied engagement in any other disordered eating behaviors. Based on the aforementioned, the following diagnosis was assigned: F50.89 Other Specified Feeding or Eating Disorder, Emotional Eating Behaviors.   Plan: Tanisa appears able and willing to participate as  evidenced by engagement in reciprocal conversation and asking questions as needed for clarification. The next appointment is scheduled for 09/08/2023 at 3pm, which will be via MyChart Video Visit. The following treatment goal was established: increase coping skills. This provider will regularly review the  treatment plan and medical chart to keep informed of status changes. Helyn expressed understanding and agreement with the initial treatment plan of care. Liberta will be sent a handout via e-mail to utilize between now and the next appointment to increase awareness of hunger patterns and subsequent eating. Louna provided verbal consent during today's appointment for this provider to send the handout via e-mail.   Wyatt Fire, PsyD

## 2023-08-20 ENCOUNTER — Ambulatory Visit (INDEPENDENT_AMBULATORY_CARE_PROVIDER_SITE_OTHER): Payer: 59 | Admitting: Family Medicine

## 2023-08-27 ENCOUNTER — Ambulatory Visit (INDEPENDENT_AMBULATORY_CARE_PROVIDER_SITE_OTHER): Payer: 59 | Admitting: Family Medicine

## 2023-08-27 ENCOUNTER — Encounter (INDEPENDENT_AMBULATORY_CARE_PROVIDER_SITE_OTHER): Payer: Self-pay | Admitting: Family Medicine

## 2023-08-27 VITALS — BP 115/70 | HR 74 | Temp 98.2°F | Ht 68.0 in | Wt 304.0 lb

## 2023-08-27 DIAGNOSIS — E559 Vitamin D deficiency, unspecified: Secondary | ICD-10-CM

## 2023-08-27 DIAGNOSIS — G4733 Obstructive sleep apnea (adult) (pediatric): Secondary | ICD-10-CM

## 2023-08-27 DIAGNOSIS — Z6841 Body Mass Index (BMI) 40.0 and over, adult: Secondary | ICD-10-CM

## 2023-08-27 DIAGNOSIS — R7303 Prediabetes: Secondary | ICD-10-CM

## 2023-08-27 NOTE — Assessment & Plan Note (Signed)
Patient started on one a day supplement in addition to her MVI.  She is still feeling fatigued.

## 2023-08-27 NOTE — Progress Notes (Signed)
   SUBJECTIVE:  Chief Complaint: Obesity  Interim History: Patient has been alright in terms of her life and she doesn't feel like she has been following the meal plan as much as she wants to. She mentions she needs to prepare and have options especially at work to eat on plan. She is trying to find better snack options to help quiet the food noise.  She is looking for dessert options to help satisfy cravings.  Oza is here to discuss her progress with her obesity treatment plan. She is on the Category 4 Plan and journaling 450-600 and 350-650 and states she is following her eating plan approximately 50% of the time. She states she is exercising 30 minutes 2 times per week.   OBJECTIVE: Visit Diagnoses: Problem List Items Addressed This Visit       Respiratory   OSA (obstructive sleep apnea)   Will not pursue Zepbound prescription at this time.  Will revisit medication usage at follow up appointments if she is needing further management assistance.        Other   Morbid obesity (HCC)   Starting weight: 306lb on 07/23/23 Peak weight: 314 BMR: 2174 on 07/23/23 Previous obesity management: n/a Body Fat %: 49.8% Starting Meal Plan: Category 4 Meal Plan needs: options for sweets incorporation that are nutrition Likes/ Dislikes of meal plan: likes structure and food ideas       Vitamin D deficiency - Primary   Patient started on one a day supplement in addition to her MVI.  She is still feeling fatigued.        Prediabetes   Patient is only taking 500mg  Metformin currently.  She is wondering about going up to the 500mg  twice a day.        No data recorded  No data recorded  No data recorded  No data recorded    09/07/2023    2:42 PM 08/27/2023    1:00 PM 08/06/2023   11:00 AM  Vitals with BMI  Height  5\' 8"  5\' 8"   Weight  304 lbs 304 lbs  BMI  46.23 46.23  Systolic 130 115 045  Diastolic 74 70 73  Pulse 81 74 69      ASSESSMENT AND PLAN:  Diet: Montasia is  currently in the action stage of change. As such, her goal is to continue with weight loss efforts. She has agreed to Category 4 Plan.  Exercise: Rashel has been instructed that some exercise is better than none for weight loss and overall health benefits.   Behavior Modification:  We discussed the following Behavioral Modification Strategies today: increasing lean protein intake, increasing vegetables, meal planning and cooking strategies, and better snacking choices.   No follow-ups on file.Marland Kitchen She was informed of the importance of frequent follow up visits to maximize her success with intensive lifestyle modifications for her multiple health conditions.  Attestation Statements:   Reviewed by clinician on day of visit: allergies, medications, problem list, medical history, surgical history, family history, social history, and previous encounter notes.      Reuben Likes, MD

## 2023-08-27 NOTE — Assessment & Plan Note (Signed)
Patient is only taking 500mg  Metformin currently.  She is wondering about going up to the 500mg  twice a day.

## 2023-09-07 ENCOUNTER — Encounter: Payer: Self-pay | Admitting: Emergency Medicine

## 2023-09-07 ENCOUNTER — Other Ambulatory Visit: Payer: Self-pay

## 2023-09-07 ENCOUNTER — Ambulatory Visit (INDEPENDENT_AMBULATORY_CARE_PROVIDER_SITE_OTHER): Payer: 59

## 2023-09-07 ENCOUNTER — Ambulatory Visit
Admission: EM | Admit: 2023-09-07 | Discharge: 2023-09-07 | Disposition: A | Discharge status: Home / Self Care | Payer: 59 | Attending: Family Medicine | Admitting: Family Medicine

## 2023-09-07 DIAGNOSIS — M79672 Pain in left foot: Secondary | ICD-10-CM

## 2023-09-07 NOTE — ED Triage Notes (Signed)
 Pt reports left lateral foot pain/redness/swelling for last several days. Pt reports pain with ambulation and with pressure to foot. Denies any known injury.

## 2023-09-07 NOTE — Discharge Instructions (Signed)
 For the open area on the foot, you may use Hibiclens solution and apply Neosporin, patch bandages until fully healed.  Use ice, elevation, Epsom salt soaks, well-padded shoes and follow-up with podiatry such as [facility] if not resolving

## 2023-09-08 ENCOUNTER — Telehealth (INDEPENDENT_AMBULATORY_CARE_PROVIDER_SITE_OTHER): Payer: 59 | Admitting: Psychology

## 2023-09-10 NOTE — Assessment & Plan Note (Signed)
 Will not pursue Zepbound prescription at this time.  Will revisit medication usage at follow up appointments if she is needing further management assistance.

## 2023-09-10 NOTE — Assessment & Plan Note (Signed)
 Starting weight: 306lb on 07/23/23 Peak weight: 314 BMR: 2174 on 07/23/23 Previous obesity management: n/a Body Fat %: 49.8% Starting Meal Plan: Category 4 Meal Plan needs: options for sweets incorporation that are nutrition Likes/ Dislikes of meal plan: likes structure and food ideas

## 2023-09-10 NOTE — ED Provider Notes (Signed)
 [facility]    CSN: [id number] Arrival date & time: 09/07/23  1402      History   Chief Complaint Chief Complaint  Patient presents with   Foot Pain    HPI Deborah Riddle is a 38 y.o. female.   Patient presenting today with several day history of left lateral foot redness, swelling, pain worse with ambulation and applying pressure to the foot.  States has a crack in the bottom of the foot in this area and wanted to make sure it was not infected or nothing with the bones was wrong.  Denies known injury to the area, numbness, tingling, loss of range of motion.  Trying over-the-counter pain relievers with minimal relief.    Past Medical History:  Diagnosis Date   Anemia    Back pain    Body mass index 40.0-44.9, adult (HCC)    Chest pain    Chest pain, unspecified    Constipation    Dysrhythmia    AFib   GERD (gastroesophageal reflux disease)    Infertility, female    Joint pain    Morbid obesity (HCC)    Palpitations    Paroxysmal atrial fibrillation (HCC)    Polycystic disease, ovaries    Sickle cell trait (HCC)    Sleep apnea    Tobacco use disorder    Vitamin D deficiency     Patient Active Problem List   Diagnosis Date Noted   Prediabetes 08/06/2023   Hyperlipidemia 07/23/2023   Polycystic disease, ovaries    Sickle cell trait (HCC)    Vitamin D deficiency    Back pain 05/30/2023   Right flank pain 11/01/2022   Encounter for routine adult physical exam with abnormal findings 10/07/2022   Hx of eczema 10/07/2022   OSA (obstructive sleep apnea) 01/10/2022   Incomplete spontaneous abortion 07/07/2020   Dyspepsia    Wound dehiscence 06/18/2018   Hidradenitis 05/19/2018   Abdominal pain 05/13/2018   Morbid obesity (HCC) 12/22/2014   Chest pain 09/23/2011   Paroxysmal atrial fibrillation (HCC)    Palpitations 03/23/2009    Past Surgical History:  Procedure Laterality Date   BIOPSY  08/07/2018   Procedure: BIOPSY;  Surgeon: West Bali, MD;  [facility]: [facility];  Service: Endoscopy;;  gastric bx's   DILATION AND EVACUATION  07/07/2020   Procedure: DILATATION AND EVACUATION;  Surgeon: Maxie Better, MD;  Location: [facility];  Service: Gynecology;;   ESOPHAGOGASTRODUODENOSCOPY N/A 08/07/2018   Procedure: ESOPHAGOGASTRODUODENOSCOPY (EGD);  Surgeon: West Bali, MD;  [facility]: [facility];  Service: Endoscopy;  Laterality: N/A;  3:00pm   HYDRADENITIS EXCISION Right 05/29/2018   Procedure: EXCISION HIDRADENITIS AXILLA;  Surgeon: Lucretia Roers, MD;  Location: [facility];  Service: General;  Laterality: Right;   MALONEY DILATION N/A 08/07/2018   Procedure: Alvy Beal;  Surgeon: West Bali, MD;  [facility];  Service: Endoscopy;  Laterality: N/A;   none     SAVORY DILATION N/A 08/07/2018   Procedure: SAVORY DILATION;  Surgeon: West Bali, MD;  [facility]: [facility];  Service: Endoscopy;  Laterality: N/A;   WISDOM TOOTH EXTRACTION      OB History     Gravida  3   Para      Term      Preterm      AB  2   Living  0      SAB  2   IAB  Ectopic      Multiple      Live Births               Home Medications    Prior to Admission medications   Medication Sig Start Date End Date Taking? Authorizing Provider  metFORMIN (GLUCOPHAGE) 500 MG tablet Take 1 tablet (500 mg total) by mouth 2 (two) times daily with a meal. 01/24/23   Del Newman Nip, Tenna Child, FNP  propranolol (INDERAL) 10 MG tablet Take 1 tablet (10 mg total) by mouth 4 (four) times daily as needed (palpitations). 01/21/23   Nahser, Deloris Ping, MD  Vitamin D, Ergocalciferol, (DRISDOL) 50000 UNITS CAPS capsule Take 50,000 Units by mouth every Saturday. 04/22/14   [provider]    Family History Family History  Problem Relation Age of Onset   Hyperlipidemia Mother    Arrhythmia Mother 22       History of Cardiac Stents   Hypertension Mother    Diabetes Mother    COPD Mother    Colon polyps  Mother    Heart disease Mother    Hypertension Father    Diabetes Sister    Hypertension Sister    Colon cancer Maternal Uncle    Crohn's disease Cousin    Diabetes Other        Family History   Hypertension Other        Family History    Social History Social History   Tobacco Use   Smoking status: Former    Current packs/day: 0.00    Average packs/day: 1 pack/day for 12.0 years (12.0 ttl pk-yrs)    Types: Cigarettes    Start date: 01/21/2004    Quit date: 09/10/2011    Years since quitting: 12.0   Smokeless tobacco: Never  Vaping Use   Vaping status: Never Used  Substance Use Topics   Alcohol use: Never    Alcohol/week: 0.0 standard drinks of alcohol   Drug use: No     Allergies   Dificid [fidaxomicin], Amoxicillin, Clindamycin, Clindamycin/lincomycin, and Heparin   Review of Systems Review of Systems Per HPI  Physical Exam Triage Vital Signs ED Triage Vitals  Encounter Vitals Group     BP 09/07/23 1442 130/74     Systolic BP Percentile --      Diastolic BP Percentile --      Pulse Rate 09/07/23 1442 81     Resp 09/07/23 1442 20     Temp 09/07/23 1442 97.8 F (36.6 C)     Temp Source 09/07/23 1442 Oral     SpO2 09/07/23 1442 95 %     Weight --      Height --      Head Circumference --      Peak Flow --      Pain Score 09/07/23 1441 6     Pain Loc --      Pain Education --      Exclude from Growth Chart --    No data found.  Updated Vital Signs BP 130/74 (BP Location: Right Arm)   Pulse 81   Temp 97.8 F (36.6 C) (Oral)   Resp 20   LMP 08/11/2023 (Approximate)   SpO2 95%   Visual Acuity Right Eye Distance:   Left Eye Distance:   Bilateral Distance:    Right Eye Near:   Left Eye Near:    Bilateral Near:     Physical Exam Vitals and nursing note reviewed.  Constitutional:  Appearance: Normal appearance. She is not ill-appearing.  HENT:     Head: Atraumatic.  Eyes:     Extraocular Movements: Extraocular movements intact.      Conjunctiva/sclera: Conjunctivae normal.  Cardiovascular:     Rate and Rhythm: Normal rate and regular rhythm.     Heart sounds: Normal heart sounds.  Pulmonary:     Effort: Pulmonary effort is normal.     Breath sounds: Normal breath sounds.  Musculoskeletal:        General: Tenderness present. No swelling or deformity. Normal range of motion.     Cervical back: Normal range of motion and neck supple.     Comments: Tender to palpation to left lateral foot, no bony deformity palpable, range of motion intact  Skin:    General: Skin is warm and dry.     Findings: Erythema present.     Comments: Cracked area to the plantar surface of the left lateral foot, no significant erythema, drainage, bleeding from the area.  Neurological:     Mental Status: She is alert and oriented to person, place, and time.     Motor: No weakness.     Gait: Gait normal.     Comments: Left foot neurovascularly intact  Psychiatric:        Mood and Affect: Mood normal.        Thought Content: Thought content normal.        Judgment: Judgment normal.      UC Treatments / Results  Labs (all labs ordered are listed, but only abnormal results are displayed) Labs Reviewed - No data to display  EKG   Radiology No results found.  Procedures Procedures (including critical care time)  Medications Ordered in UC Medications - No data to display  Initial Impression / Assessment and Plan / UC Course  I have reviewed the triage vital signs and the nursing notes.  Pertinent labs & imaging results that were available during my care of the patient were reviewed by me and considered in my medical decision making (see chart for details).     X-ray of the left foot negative for acute bony abnormality and no apparent cellulitis to the area.  Discussed RICE protocol, will supportive shoes and podiatry follow-up if not resolving.  Final Clinical Impressions(s) / UC Diagnoses   Final diagnoses:  Left foot pain      Discharge Instructions      For the open area on the foot, you may use Hibiclens solution and apply Neosporin, patch bandages until fully healed.  Use ice, elevation, Epsom salt soaks, well-padded shoes and follow-up with podiatry such as [facility] if not resolving    ED Prescriptions   None    PDMP not reviewed this encounter.   Particia Nearing, [place] 09/10/23 [phone]

## 2023-09-11 ENCOUNTER — Ambulatory Visit: Payer: 59 | Admitting: Podiatry

## 2023-09-11 ENCOUNTER — Encounter: Payer: Self-pay | Admitting: Podiatry

## 2023-09-11 DIAGNOSIS — Z6841 Body Mass Index (BMI) 40.0 and over, adult: Secondary | ICD-10-CM | POA: Insufficient documentation

## 2023-09-11 DIAGNOSIS — L309 Dermatitis, unspecified: Secondary | ICD-10-CM

## 2023-09-11 DIAGNOSIS — E7800 Pure hypercholesterolemia, unspecified: Secondary | ICD-10-CM | POA: Insufficient documentation

## 2023-09-11 DIAGNOSIS — N951 Menopausal and female climacteric states: Secondary | ICD-10-CM | POA: Insufficient documentation

## 2023-09-11 DIAGNOSIS — M778 Other enthesopathies, not elsewhere classified: Secondary | ICD-10-CM

## 2023-09-11 DIAGNOSIS — E8881 Metabolic syndrome: Secondary | ICD-10-CM | POA: Insufficient documentation

## 2023-09-11 DIAGNOSIS — R234 Changes in skin texture: Secondary | ICD-10-CM | POA: Diagnosis not present

## 2023-09-11 DIAGNOSIS — Z8742 Personal history of other diseases of the female genital tract: Secondary | ICD-10-CM | POA: Insufficient documentation

## 2023-09-11 MED ORDER — FLUCONAZOLE 150 MG PO TABS: 150.0000 mg | ORAL_TABLET | Freq: Once | ORAL | 0 refills | Status: AC

## 2023-09-11 MED ORDER — DESOXIMETASONE 0.25 % EX CREA: 1.0000 | TOPICAL_CREAM | Freq: Two times a day (BID) | CUTANEOUS | 0 refills | Status: DC

## 2023-09-11 MED ORDER — DOXYCYCLINE HYCLATE 100 MG PO TABS: 100.0000 mg | ORAL_TABLET | Freq: Two times a day (BID) | ORAL | 0 refills | Status: DC

## 2023-09-11 MED ORDER — METHYLPREDNISOLONE 4 MG PO TBPK: ORAL_TABLET | ORAL | 0 refills | Status: DC

## 2023-09-11 NOTE — Progress Notes (Signed)
 Subjective:  Patient ID: Deborah Riddle, female    DOB: 22-Apr-1986,  MRN: [id number] HPI Chief Complaint  Patient presents with   Nail Problem    Plantar feet - thick, callused patches of skin that crack and get really sore x years, she gets pedicures and trims and lotions herself-hasn't really helped much, recently started to get pain in the left (mostly dorsal forefoot)-noticed swelling, thinks from compensation, seen at Urgent Care-xrayed-"wasn't much said about the xray", told to soak and lotion, cracks sometimes bleed   New Patient (Initial Visit)    38 y.o. female presents with the above complaint.   ROS: Denies fever chills nausea vomit muscle aches pains calf pain back pain chest pain shortness of breath.  Past Medical History:  Diagnosis Date   Anemia    Back pain    Body mass index 40.0-44.9, adult (HCC)    Chest pain    Chest pain, unspecified    Constipation    Dysrhythmia    AFib   GERD (gastroesophageal reflux disease)    Infertility, female    Joint pain    Morbid obesity (HCC)    Palpitations    Paroxysmal atrial fibrillation (HCC)    Polycystic disease, ovaries    Sickle cell trait (HCC)    Sleep apnea    Tobacco use disorder    Vitamin D deficiency    Past Surgical History:  Procedure Laterality Date   BIOPSY  08/07/2018   Procedure: BIOPSY;  Surgeon: West Bali, MD;  [facility];  Service: Endoscopy;;  gastric bx's   DILATION AND EVACUATION  07/07/2020   Procedure: DILATATION AND EVACUATION;  Surgeon: Maxie Better, MD;  Location: [facility];  Service: Gynecology;;   ESOPHAGOGASTRODUODENOSCOPY N/A 08/07/2018   Procedure: ESOPHAGOGASTRODUODENOSCOPY (EGD);  Surgeon: West Bali, MD;  [facility];  Service: Endoscopy;  Laterality: N/A;  3:00pm   HYDRADENITIS EXCISION Right 05/29/2018   Procedure: EXCISION HIDRADENITIS AXILLA;  Surgeon: Lucretia Roers, MD;  Location: [facility];  Service: General;  Laterality: Right;    MALONEY DILATION N/A 08/07/2018   Procedure: Alvy Beal;  Surgeon: West Bali, MD;  [facility];  Service: Endoscopy;  Laterality: N/A;   none     SAVORY DILATION N/A 08/07/2018   Procedure: SAVORY DILATION;  Surgeon: West Bali, MD;  [facility];  Service: Endoscopy;  Laterality: N/A;   WISDOM TOOTH EXTRACTION      Current Outpatient Medications:    desoximetasone (TOPICORT) 0.25 % cream, Apply 1 Application topically 2 (two) times daily., Disp: 30 g, Rfl: 0   doxycycline (VIBRA-TABS) 100 MG tablet, Take 1 tablet (100 mg total) by mouth 2 (two) times daily., Disp: 20 tablet, Rfl: 0   fluconazole (DIFLUCAN) 150 MG tablet, Take 1 tablet (150 mg total) by mouth once for 1 dose., Disp: 1 tablet, Rfl: 0   HYDROcodone-acetaminophen (NORCO/VICODIN) 5-325 MG tablet, Take 1 tablet by mouth every 6 (six) hours as needed., Disp: , Rfl:    methylPREDNISolone (MEDROL DOSEPAK) 4 MG TBPK tablet, 6 day dose pack - take as directed, Disp: 21 tablet, Rfl: 0   metFORMIN (GLUCOPHAGE) 500 MG tablet, Take 1 tablet (500 mg total) by mouth 2 (two) times daily with a meal., Disp: 180 tablet, Rfl: 1   propranolol (INDERAL) 10 MG tablet, Take 1 tablet (10 mg total) by mouth 4 (four) times daily as needed (palpitations)., Disp: 120 tablet, Rfl: 5   Vitamin D,  Ergocalciferol, (DRISDOL) 50000 UNITS CAPS capsule, Take 50,000 Units by mouth every Saturday., Disp: , Rfl:   Allergies  Allergen Reactions   Dificid [Fidaxomicin] Shortness Of Breath   Amoxicillin Swelling   Clindamycin Diarrhea   Clindamycin/Lincomycin     "caused c diff"   Heparin Other (See Comments) and Nausea And Vomiting    Lowers BP  Lowers blood pressure   Review of Systems Objective:  There were no vitals filed for this visit.  General: Well developed, nourished, in no acute distress, alert and oriented x3   Dermatological: Skin is warm, dry and supple bilateral. Nails x 10 are well maintained; remaining  integument appears unremarkable at this time.  She has plaques on the bottom of her feet with fissures most likely eczema she also has 1 in her right hand.  There is a single fissure on the lateral aspect of the left foot that does demonstrate some surrounding cellulitis which is painful.   Vascular: Dorsalis Pedis artery and Posterior Tibial artery pedal pulses are 2/4 bilateral with immedate capillary fill time. Pedal hair growth present. No varicosities and no lower extremity edema present bilateral.   Neruologic: Grossly intact via light touch bilateral. Vibratory intact via tuning fork bilateral. Protective threshold with Semmes Wienstein monofilament intact to all pedal sites bilateral. Patellar and Achilles deep tendon reflexes 2+ bilateral. No Babinski or clonus noted bilateral.   Musculoskeletal: No gross boney pedal deformities bilateral. No pain, crepitus, or limitation noted with foot and ankle range of motion bilateral. Muscular strength 5/5 in all groups tested bilateral.  Gait: Unassisted, Nonantalgic.    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  Plan: Discussed etiology pathology and surgical therapies at this time I recommended that we refer her to dermatology.  I am going to start her on doxycycline for the mild cellulitis and the Diflucan.  Also started her on triamcinolone discussed the use of O'Keefe's under occlusion and provided her with methylprednisolone which she will start after she has completed her antibiotic.     Thomos Domine T. [place], [place]

## 2023-09-23 ENCOUNTER — Ambulatory Visit (INDEPENDENT_AMBULATORY_CARE_PROVIDER_SITE_OTHER): Payer: 59 | Admitting: Family Medicine

## 2023-09-29 ENCOUNTER — Encounter (INDEPENDENT_AMBULATORY_CARE_PROVIDER_SITE_OTHER): Payer: Self-pay

## 2023-11-03 ENCOUNTER — Ambulatory Visit: Payer: Self-pay | Admitting: *Deleted

## 2023-11-03 NOTE — Telephone Encounter (Signed)
  Chief Complaint: dizziness- swimmy headed Symptoms: dizziness, swimmy headed, spinning sensation with eyes closed, some vision changes- eye watery Frequency: started 3 days ago Pertinent Negatives: Patient denies fever, chest pain, vomiting, diarrhea, bleeding  Disposition: [] ED /[] Urgent Care (no appt availability in office) / [x] Appointment(In office/virtual)/ []  [facility]/ [] Home Care/ [] Refused Recommended Disposition /[] [place] [facility]/ []  Follow-up with PCP Additional Notes: New onset dizziness- appointment scheduled   Copied from CRM [id number]. Topic: Clinical - Red Word Triage >> Nov 03, 2023 12:12 PM Emylou G wrote: [facility] that prompted transfer to Nurse Triage: Light headed, vision blurr, dizzyness Reason for Disposition  [1] MODERATE dizziness (e.g., interferes with normal activities) AND [2] has NOT been evaluated by doctor (or NP/PA) for this  (Exception: Dizziness caused by heat exposure, sudden standing, or poor fluid intake.)  Answer Assessment - Initial Assessment Questions 1. DESCRIPTION: "Describe your dizziness."     Swimmy headed 2. LIGHTHEADED: "Do you feel lightheaded?" (e.g., somewhat faint, woozy, weak upon standing)     woozy 3. VERTIGO: "Do you feel like either you or the room is spinning or tilting?" (i.e. vertigo)     No- spinning 4. SEVERITY: "How bad is it?"  "Do you feel like you are going to faint?" "Can you stand and walk?"   - MILD: Feels slightly dizzy, but walking normally.   - MODERATE: Feels unsteady when walking, but not falling; interferes with normal activities (e.g., school, work).   - SEVERE: Unable to walk without falling, or requires assistance to walk without falling; feels like passing out now.      mild 5. ONSET:  "When did the dizziness begin?"     3 days 6. AGGRAVATING FACTORS: "Does anything make it worse?" (e.g., standing, change in head position)     Closing eye 7. HEART RATE: "Can you tell me your heart rate?"  "How many beats in 15 seconds?"  (Note: not all patients can do this)       No changes 8. CAUSE: "What do you think is causing the dizziness?"     unsure 9. RECURRENT SYMPTOM: "Have you had dizziness before?" If Yes, ask: "When was the last time?" "What happened that time?"     Yes- not recent 10. OTHER SYMPTOMS: "Do you have any other symptoms?" (e.g., fever, chest pain, vomiting, diarrhea, bleeding)       Blood vessel in eye burst- Thursday, some blurred vision- watery dischange  Protocols used: Dizziness - Lightheadedness-A-AH

## 2023-11-04 ENCOUNTER — Ambulatory Visit: Admitting: Internal Medicine

## 2023-11-04 VITALS — BP 138/80 | HR 85 | Ht 68.0 in | Wt 313.4 lb

## 2023-11-04 DIAGNOSIS — J0120 Acute ethmoidal sinusitis, unspecified: Secondary | ICD-10-CM | POA: Diagnosis not present

## 2023-11-04 MED ORDER — FLUTICASONE PROPIONATE 50 MCG/ACT NA SUSP
2.0000 | Freq: Every day | NASAL | 1 refills | Status: DC
Start: 2023-11-04 — End: 2024-02-02

## 2023-11-04 MED ORDER — LEVOCETIRIZINE DIHYDROCHLORIDE 5 MG PO TABS
5.0000 mg | ORAL_TABLET | Freq: Every evening | ORAL | 0 refills | Status: DC
Start: 2023-11-04 — End: 2024-02-02

## 2023-11-04 NOTE — Patient Instructions (Signed)
 Please start taking Xyzal  5 mg at bedtime.  Please start using Flonase  for nasal congestion.  Please perform sinus rinse as tolerated.  Please use vaporizer for nasal congestion.

## 2023-11-04 NOTE — Progress Notes (Signed)
 Acute Office Visit  Subjective:    Patient ID: Deborah Riddle, female    DOB: 1985/12/01, 38 y.o.   MRN: [id number]  Chief Complaint  Patient presents with   Dizziness    Pt reports sx of dizziness. Pressure behind her eyes , and some pressure in her ears. Ongoing since 10/30/23.    HPI Patient is in today for complaint of pressure behind her eyes and bilateral ear fullness for the last 4 days.  She reports fatigue and intermittent dizziness as well.  Denies any fever or chills.  Denies dyspnea or wheezing currently.  Past Medical History:  Diagnosis Date   Anemia    Back pain    Body mass index 40.0-44.9, adult (HCC)    Chest pain    Chest pain, unspecified    Constipation    Dysrhythmia    AFib   GERD (gastroesophageal reflux disease)    Infertility, female    Joint pain    Morbid obesity (HCC)    Palpitations    Paroxysmal atrial fibrillation (HCC)    Polycystic disease, ovaries    Sickle cell trait (HCC)    Sleep apnea    Tobacco use disorder    Vitamin D  deficiency     Past Surgical History:  Procedure Laterality Date   BIOPSY  08/07/2018   Procedure: BIOPSY;  Surgeon: Alyce Jubilee, MD;  [facility];  Service: Endoscopy;;  gastric bx's   DILATION AND EVACUATION  07/07/2020   Procedure: DILATATION AND EVACUATION;  Surgeon: Ivery Marking, MD;  Location: [facility];  Service: Gynecology;;   ESOPHAGOGASTRODUODENOSCOPY N/A 08/07/2018   Procedure: ESOPHAGOGASTRODUODENOSCOPY (EGD);  Surgeon: Alyce Jubilee, MD;  [facility];  Service: Endoscopy;  Laterality: N/A;  3:00pm   HYDRADENITIS EXCISION Right 05/29/2018   Procedure: EXCISION HIDRADENITIS AXILLA;  Surgeon: Awilda Bogus, MD;  Location: [facility];  Service: General;  Laterality: Right;   MALONEY DILATION N/A 08/07/2018   Procedure: Dorothyann Gather;  Surgeon: Alyce Jubilee, MD;  [facility];  Service: Endoscopy;  Laterality: N/A;   none     SAVORY DILATION N/A  08/07/2018   Procedure: SAVORY DILATION;  Surgeon: Alyce Jubilee, MD;  [facility];  Service: Endoscopy;  Laterality: N/A;   WISDOM TOOTH EXTRACTION      Family History  Problem Relation Age of Onset   Hyperlipidemia Mother    Arrhythmia Mother 27       History of Cardiac Stents   Hypertension Mother    Diabetes Mother    COPD Mother    Colon polyps Mother    Heart disease Mother    Hypertension Father    Diabetes Sister    Hypertension Sister    Colon cancer Maternal Uncle    Crohn's disease Cousin    Diabetes Other        Family History   Hypertension Other        Family History    Social History   Socioeconomic History   Marital status: Married    Spouse name: CHRISTOPHER    Number of children: Not on file   Years of education: Not on file   Highest education level: Bachelor's degree (e.g., BA, AB, BS)  Occupational History   Occupation: PHLEBOTOMIST    Employer: [employer]  Tobacco Use   Smoking status: Former    Current packs/day: 0.00    Average packs/day: 1 pack/day for 12.0 years (12.0 ttl  pk-yrs)    Types: Cigarettes    Start date: 01/21/2004    Quit date: 09/10/2011    Years since quitting: 12.1   Smokeless tobacco: Never  Vaping Use   Vaping status: Never Used  Substance and Sexual Activity   Alcohol use: Never    Alcohol/week: 0.0 standard drinks of alcohol   Drug use: No   Sexual activity: Yes  Other Topics Concern   Not on file  Social History Narrative   Lives in [place], [place], [place] with husband.  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   Social Drivers of Corporate investment banker Strain: Low Risk  (11/04/2023)   Overall Financial Resource Strain (CARDIA)    Difficulty of Paying Living Expenses: Not hard at all  Food Insecurity: No Food Insecurity (11/04/2023)   Hunger Vital Sign    Worried About Running Out of Food in the Last Year: Never true    Ran Out of Food in the Last Year:  Never true  Transportation Needs: No Transportation Needs (11/04/2023)   PRAPARE - Administrator, Civil Service (Medical): No    Lack of Transportation (Non-Medical): No  Physical Activity: Insufficiently Active (11/04/2023)   Exercise Vital Sign    Days of Exercise per Week: 2 days    Minutes of Exercise per Session: 30 min  Stress: No Stress Concern Present (11/04/2023)   [employer] of Occupational Health - Occupational Stress Questionnaire    Feeling of Stress : Not at all  Social Connections: Unknown (11/04/2023)   Social Connection and Isolation Panel [NHANES]    Frequency of Communication with Friends and Family: More than three times a week    Frequency of Social Gatherings with Friends and Family: More than three times a week    Attends Religious Services: 1 to 4 times per year    Active Member of [employer] or Organizations: Patient declined    Attends Engineer, structural: Not on file    Marital Status: Married  Catering manager Violence: Not on file    Outpatient Medications Prior to Visit  Medication Sig Dispense Refill   metFORMIN  (GLUCOPHAGE ) 500 MG tablet Take 1 tablet (500 mg total) by mouth 2 (two) times daily with a meal. 180 tablet 1   propranolol  (INDERAL ) 10 MG tablet Take 1 tablet (10 mg total) by mouth 4 (four) times daily as needed (palpitations). 120 tablet 5   desoximetasone  (TOPICORT ) 0.25 % cream Apply 1 Application topically 2 (two) times daily. 30 g 0   doxycycline  (VIBRA -TABS) 100 MG tablet Take 1 tablet (100 mg total) by mouth 2 (two) times daily. 20 tablet 0   HYDROcodone -acetaminophen  (NORCO/VICODIN) 5-325 MG tablet Take 1 tablet by mouth every 6 (six) hours as needed.     methylPREDNISolone  (MEDROL  DOSEPAK) 4 MG TBPK tablet 6 day dose pack - take as directed 21 tablet 0   Vitamin D , Ergocalciferol , (DRISDOL) 50000 UNITS CAPS capsule Take 50,000 Units by mouth every Saturday.     No facility-administered medications prior to visit.     Allergies  Allergen Reactions   Dificid [Fidaxomicin] Shortness Of Breath   Amoxicillin Swelling   Clindamycin Diarrhea   Clindamycin/Lincomycin     "caused c diff"   Heparin Other (See Comments) and Nausea And Vomiting    Lowers BP  Lowers blood pressure    Review of Systems  Constitutional:  Negative for chills and fever.  HENT:  Negative  for rhinorrhea and sore throat.   Eyes:  Positive for pain (Pressure). Negative for discharge.  Respiratory:  Negative for cough and shortness of breath.   Cardiovascular:  Negative for chest pain and palpitations.  Gastrointestinal:  Negative for abdominal pain and diarrhea.  Endocrine: Negative for polydipsia and polyuria.  Genitourinary:  Negative for dysuria and hematuria.  Musculoskeletal:  Negative for neck pain and neck stiffness.  Skin:  Negative for rash.  Neurological:  Positive for dizziness. Negative for weakness.  Psychiatric/Behavioral:  Negative for agitation and behavioral problems.        Objective:    Physical Exam Vitals reviewed.  Constitutional:      General: She is not in acute distress.    Appearance: She is not diaphoretic.  HENT:     Nose: Congestion present.     Mouth/Throat:     Mouth: Mucous membranes are moist.     Pharynx: Posterior oropharyngeal erythema present.  Eyes:     General: No scleral icterus.    Extraocular Movements: Extraocular movements intact.  Cardiovascular:     Rate and Rhythm: Normal rate and regular rhythm.     Heart sounds: Normal heart sounds. No murmur heard. Pulmonary:     Breath sounds: Normal breath sounds. No wheezing or rales.  Musculoskeletal:     Right lower leg: No edema.     Left lower leg: No edema.  Skin:    General: Skin is warm.     Findings: No rash.  Neurological:     General: No focal deficit present.     Mental Status: She is alert and oriented to person, place, and time.  Psychiatric:        Mood and Affect: Mood normal.        Behavior:  Behavior normal.     BP 138/80   Pulse 85   Ht 5\' 8"  (1.727 m)   Wt (!) 313 lb 6.4 oz (142.2 kg)   SpO2 98%   BMI 47.65 kg/m  Wt Readings from Last 3 Encounters:  11/04/23 (!) 313 lb 6.4 oz (142.2 kg)  08/27/23 (!) 304 lb (137.9 kg)  08/06/23 (!) 304 lb (137.9 kg)        Assessment & Plan:   Problem List Items Addressed This Visit       Respiratory   Acute non-recurrent ethmoidal sinusitis - Primary   Likely has allergic sinusitis -bilateral eye pressure, ear fullness and dizziness likely due to ethmoidal sinusitis Started Xyzal  5 mg once daily Flonase  for nasal congestion/allergies Advised to perform sinus rinse and use vaporizer for nasal congestion If persistent symptoms despite symptomatic treatment, will consider antibiotic      Relevant Medications   fluticasone  (FLONASE ) 50 MCG/ACT nasal spray   levocetirizine (XYZAL ) 5 MG tablet     Meds ordered this encounter  Medications   fluticasone  (FLONASE ) 50 MCG/ACT nasal spray    Sig: Place 2 sprays into both nostrils daily.    Dispense:  16 g    Refill:  1   levocetirizine (XYZAL ) 5 MG tablet    Sig: Take 1 tablet (5 mg total) by mouth every evening.    Dispense:  30 tablet    Refill:  0     Tasharra Nodine Alyssa Backbone, MD

## 2023-11-04 NOTE — Assessment & Plan Note (Addendum)
 Likely has allergic sinusitis -bilateral eye pressure, ear fullness and dizziness likely due to ethmoidal sinusitis Started Xyzal  5 mg once daily Flonase  for nasal congestion/allergies Advised to perform sinus rinse and use vaporizer for nasal congestion If persistent symptoms despite symptomatic treatment, will consider antibiotic

## 2023-11-04 NOTE — Telephone Encounter (Signed)
Noted appointment scheduled.

## 2023-11-05 ENCOUNTER — Encounter: Payer: Self-pay | Admitting: Family Medicine

## 2023-11-05 LAB — CBC WITH DIFFERENTIAL/PLATELET
Basophils Absolute: 0.1 10*3/uL (ref 0.0–0.2)
Basos: 1 %
EOS (ABSOLUTE): 0.1 10*3/uL (ref 0.0–0.4)
Eos: 1 %
Hematocrit: 37.5 % (ref 34.0–46.6)
Hemoglobin: 12 g/dL (ref 11.1–15.9)
Immature Grans (Abs): 0.1 10*3/uL (ref 0.0–0.1)
Immature Granulocytes: 1 %
Lymphocytes Absolute: 2.7 10*3/uL (ref 0.7–3.1)
Lymphs: 32 %
MCH: 23.7 pg — ABNORMAL LOW (ref 26.6–33.0)
MCHC: 32 g/dL (ref 31.5–35.7)
MCV: 74 fL — ABNORMAL LOW (ref 79–97)
Monocytes Absolute: 0.6 10*3/uL (ref 0.1–0.9)
Monocytes: 7 %
Neutrophils Absolute: 4.9 10*3/uL (ref 1.4–7.0)
Neutrophils: 58 %
RBC: 5.06 x10E6/uL (ref 3.77–5.28)
RDW: 17.9 % — ABNORMAL HIGH (ref 11.7–15.4)
WBC: 8.3 10*3/uL (ref 3.4–10.8)

## 2023-11-05 LAB — IRON,TIBC AND FERRITIN PANEL
Ferritin: 30 ng/mL (ref 15–150)
Iron Saturation: 16 % (ref 15–55)
Iron: 57 ug/dL (ref 27–159)
Total Iron Binding Capacity: 352 ug/dL (ref 250–450)
UIBC: 295 ug/dL (ref 131–425)

## 2023-11-05 LAB — VITAMIN D 25 HYDROXY (VIT D DEFICIENCY, FRACTURES): Vit D, 25-Hydroxy: 35.2 ng/mL (ref 30.0–100.0)

## 2023-11-05 LAB — VITAMIN B12: Vitamin B-12: 266 pg/mL (ref 232–1245)

## 2023-11-20 ENCOUNTER — Encounter (HOSPITAL_COMMUNITY): Payer: Self-pay

## 2023-11-27 ENCOUNTER — Ambulatory Visit: Payer: BC Managed Care – PPO | Admitting: Family Medicine

## 2024-01-26 ENCOUNTER — Other Ambulatory Visit: Payer: Self-pay

## 2024-01-26 ENCOUNTER — Ambulatory Visit: Admission: EM | Admit: 2024-01-26 | Discharge: 2024-01-26 | Discharge status: Against Medical Advice

## 2024-01-26 ENCOUNTER — Encounter: Payer: Self-pay | Admitting: Emergency Medicine

## 2024-01-26 ENCOUNTER — Ambulatory Visit (HOSPITAL_COMMUNITY)
Admission: RE | Admit: 2024-01-26 | Discharge: 2024-01-26 | Disposition: A | Discharge status: Home / Self Care | Source: Ambulatory Visit | Attending: Nurse Practitioner | Admitting: Nurse Practitioner

## 2024-01-26 ENCOUNTER — Ambulatory Visit: Admission: EM | Admit: 2024-01-26 | Discharge: 2024-01-26 | Disposition: A | Discharge status: Home / Self Care

## 2024-01-26 DIAGNOSIS — M79676 Pain in unspecified toe(s): Secondary | ICD-10-CM | POA: Diagnosis not present

## 2024-01-26 DIAGNOSIS — M79674 Pain in right toe(s): Secondary | ICD-10-CM

## 2024-01-26 DIAGNOSIS — M7989 Other specified soft tissue disorders: Secondary | ICD-10-CM | POA: Insufficient documentation

## 2024-01-26 DIAGNOSIS — M79671 Pain in right foot: Secondary | ICD-10-CM | POA: Diagnosis present

## 2024-01-26 DIAGNOSIS — M79661 Pain in right lower leg: Secondary | ICD-10-CM | POA: Diagnosis not present

## 2024-01-26 NOTE — ED Triage Notes (Addendum)
 Pt reports swelling to bilateral feet and ankles for last several weeks. Pt also reports stubbed toe a few days ago and has increased swelling in second and third digit ever since. Pt reports has been traveling recently and has not been able to see PCP.

## 2024-01-26 NOTE — Discharge Instructions (Addendum)
 Go to the emergency department at [facility] for your x-ray.  You will be contacted on 01/27/2024 to discuss your x-ray results.  You we will also have access to your results via MyChart. You may take over-the-counter Tylenol  or ibuprofen  as needed for pain or discomfort.  Wear the postop shoe that you have at home.  Recommend wearing the shoe when you are engaged in prolonged or strenuous activity. As discussed, recommend ibuprofen  600 to 800 mg (3 to 4 tablets) every 8 hours.  You may take Tylenol  arthritis strength 650 mg tablet 1 to 2 hours after the ibuprofen  for breakthrough pain or discomfort. RICE therapy, rest, ice, compression, and elevation.  Your toes have been buddy taped to provide support.  Apply ice for 20 minutes, remove for 1 hour, repeat as needed to help with pain or swelling. Make sure you are wearing shoes with good insole and support. If the results of your x-ray are positive for fracture, it is recommended that you follow-up with orthopedics for further evaluation.  If your results of your x-ray are negative and symptoms do not improve over the next 2 to 3 weeks, it is still recommended that you follow-up with orthopedics for further evaluation. Follow-up as needed.

## 2024-01-26 NOTE — ED Provider Notes (Signed)
 [facility]    CSN: [id number] Arrival date & time: 01/26/24  1858      History   Chief Complaint Chief Complaint  Patient presents with   Foot Swelling    HPI Deborah Riddle is a 38 y.o. female.   The history is provided by the patient.   Patient presents for complaints of pain and swelling to the right foot.  States that she stubbed the 2nd and 3rd toes over the past several days.  Also states that she has a cut under the second toe, and she is concerned for possible infection.  She states that the toes have been aching today.  She states that the pain radiates up into her right lower leg.  She states that she has not noticed any bruising or redness, just pain and swelling.  States that she has been taking Tylenol  and ibuprofen  for her pain.  Also states that she has elevated her lower extremities to help with swelling.  She does endorse recent travel.  Denies fever, chills, or the inability to ambulate.  Past Medical History:  Diagnosis Date   Anemia    Back pain    Body mass index 40.0-44.9, adult (HCC)    Chest pain    Chest pain, unspecified    Constipation    Dysrhythmia    AFib   GERD (gastroesophageal reflux disease)    Infertility, female    Joint pain    Morbid obesity (HCC)    Palpitations    Paroxysmal atrial fibrillation (HCC)    Polycystic disease, ovaries    Sickle cell trait (HCC)    Sleep apnea    Tobacco use disorder    Vitamin D  deficiency     Patient Active Problem List   Diagnosis Date Noted   Acute non-recurrent ethmoidal sinusitis 11/04/2023   Adult BMI 40.0-44.9 kg/sq m (HCC) 09/11/2023   Elevated LDL cholesterol level 09/11/2023   History of PCOS 09/11/2023   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   Menopausal and female climacteric states 09/11/2023   Prediabetes 08/06/2023   Hyperlipidemia 07/23/2023   Polycystic disease, ovaries    Sickle cell trait (HCC)    Vitamin D  deficiency    Back pain 05/30/2023    Right flank pain 11/01/2022   Encounter for routine adult physical exam with abnormal findings 10/07/2022   Hx of eczema 10/07/2022   OSA (obstructive sleep apnea) 01/10/2022   Incomplete spontaneous abortion 07/07/2020   Dyspepsia    Wound dehiscence 06/18/2018   Hidradenitis 05/19/2018   Abdominal pain 05/13/2018   Morbid obesity (HCC) 12/22/2014   Chest pain 09/23/2011   Paroxysmal atrial fibrillation (HCC)    Palpitations 03/23/2009    Past Surgical History:  Procedure Laterality Date   BIOPSY  08/07/2018   Procedure: BIOPSY;  Surgeon: Harvey Margo CROME, MD;  [facility];  Service: Endoscopy;;  gastric bx's   DILATION AND EVACUATION  07/07/2020   Procedure: DILATATION AND EVACUATION;  Surgeon: Rutherford Gain, MD;  Location: [facility];  Service: Gynecology;;   ESOPHAGOGASTRODUODENOSCOPY N/A 08/07/2018   Procedure: ESOPHAGOGASTRODUODENOSCOPY (EGD);  Surgeon: Harvey Margo CROME, MD;  [facility];  Service: Endoscopy;  Laterality: N/A;  3:00pm   HYDRADENITIS EXCISION Right 05/29/2018   Procedure: EXCISION HIDRADENITIS AXILLA;  Surgeon: Kallie Manuelita BROCKS, MD;  Location: [facility];  Service: General;  Laterality: Right;   MALONEY DILATION N/A 08/07/2018   Procedure: AGAPITO HODGKIN;  Surgeon: Harvey Margo CROME, MD;  [facility];  Service: Endoscopy;  Laterality: N/A;   none     SAVORY DILATION N/A 08/07/2018   Procedure: SAVORY DILATION;  Surgeon: Harvey Margo CROME, MD;  [facility];  Service: Endoscopy;  Laterality: N/A;   WISDOM TOOTH EXTRACTION      OB History     Gravida  3   Para      Term      Preterm      AB  2   Living  0      SAB  2   IAB      Ectopic      Multiple      Live Births               Home Medications    Prior to Admission medications   Medication Sig Start Date End Date Taking? Authorizing Provider  medroxyPROGESTERone (PROVERA) 5 MG tablet Take by mouth. 10/08/23  Yes [provider]   desoximetasone  (TOPICORT ) 0.25 % cream Apply 1 Application topically 2 (two) times daily. 09/11/23   Hyatt, Max T, DPM  fluticasone  (FLONASE ) 50 MCG/ACT nasal spray Place 2 sprays into both nostrils daily. 11/04/23   Tobie Suzzane POUR, MD  levocetirizine (XYZAL ) 5 MG tablet Take 1 tablet (5 mg total) by mouth every evening. 11/04/23   Tobie Suzzane POUR, MD  metFORMIN  (GLUCOPHAGE ) 500 MG tablet Take 1 tablet (500 mg total) by mouth 2 (two) times daily with a meal. 01/24/23   Del Wilhelmena Falter, Hilario, FNP  propranolol  (INDERAL ) 10 MG tablet Take 1 tablet (10 mg total) by mouth 4 (four) times daily as needed (palpitations). 01/21/23   Nahser, Aleene PARAS, MD    Family History Family History  Problem Relation Age of Onset   Hyperlipidemia Mother    Arrhythmia Mother 78       History of Cardiac Stents   Hypertension Mother    Diabetes Mother    COPD Mother    Colon polyps Mother    Heart disease Mother    Hypertension Father    Diabetes Sister    Hypertension Sister    Colon cancer Maternal Uncle    Crohn's disease Cousin    Diabetes Other        Family History   Hypertension Other        Family History    Social History Social History   Tobacco Use   Smoking status: Former    Current packs/day: 0.00    Average packs/day: 1 pack/day for 12.0 years (12.0 ttl pk-yrs)    Types: Cigarettes    Start date: 01/21/2004    Quit date: 09/10/2011    Years since quitting: 12.3   Smokeless tobacco: Never  Vaping Use   Vaping status: Never Used  Substance Use Topics   Alcohol use: Never    Alcohol/week: 0.0 standard drinks of alcohol   Drug use: No     Allergies   Dificid [fidaxomicin], Amoxicillin, Clindamycin, Clindamycin/lincomycin, and Heparin   Review of Systems Review of Systems Per HPI  Physical Exam Triage Vital Signs ED Triage Vitals  Encounter Vitals Group     BP 01/26/24 1928 114/74     Girls Systolic BP Percentile --      Girls Diastolic BP Percentile --      Boys Systolic  BP Percentile --      Boys Diastolic BP Percentile --      Pulse Rate 01/26/24 1928 80     Resp 01/26/24  1928 20     Temp 01/26/24 1928 98.2 F (36.8 C)     Temp Source 01/26/24 1928 Oral     SpO2 01/26/24 1928 97 %     Weight --      Height --      Head Circumference --      Peak Flow --      Pain Score 01/26/24 1926 7     Pain Loc --      Pain Education --      Exclude from Growth Chart --    No data found.  Updated Vital Signs BP 114/74 (BP Location: Right Arm)   Pulse 80   Temp 98.2 F (36.8 C) (Oral)   Resp 20   LMP 01/18/2024 (Approximate)   SpO2 97%   Visual Acuity Right Eye Distance:   Left Eye Distance:   Bilateral Distance:    Right Eye Near:   Left Eye Near:    Bilateral Near:     Physical Exam Vitals and nursing note reviewed.  Constitutional:      General: She is not in acute distress.    Appearance: Normal appearance.  HENT:     Head: Normocephalic.  Eyes:     Extraocular Movements: Extraocular movements intact.     Pupils: Pupils are equal, round, and reactive to light.  Cardiovascular:     Rate and Rhythm: Normal rate and regular rhythm.     Pulses: Normal pulses.     Heart sounds: Normal heart sounds.  Pulmonary:     Effort: Pulmonary effort is normal. No respiratory distress.     Breath sounds: Normal breath sounds. No stridor. No wheezing, rhonchi or rales.  Musculoskeletal:     Cervical back: Normal range of motion.     Right foot: Decreased range of motion (2nd and 3rd toes). Normal capillary refill. Swelling present. No deformity. Normal pulse.  Feet:     Right foot:     Skin integrity: Fissure (Under the second toe of the right foot) present. No ulcer, blister, erythema or warmth.     Toenail Condition: Right toenails are normal.  Skin:    General: Skin is warm and dry.  Neurological:     General: No focal deficit present.     Mental Status: She is alert and oriented to person, place, and time.  Psychiatric:        Mood and  Affect: Mood normal.        Behavior: Behavior normal.      UC Treatments / Results  Labs (all labs ordered are listed, but only abnormal results are displayed) Labs Reviewed - No data to display  EKG   Radiology No results found.  Procedures Procedures (including critical care time)  Medications Ordered in UC Medications - No data to display  Initial Impression / Assessment and Plan / UC Course  I have reviewed the triage vital signs and the nursing notes.  Pertinent labs & imaging results that were available during my care of the patient were reviewed by me and considered in my medical decision making (see chart for details).  X-ray of the right foot is pending.  2nd and 3rd toes of the right foot were buddy taped.  Patient has a postop shoe at home that she will use.  Supportive care recommendations were provided and discussed with the patient to include over-the-counter analgesics, wearing shoes with good insole and support and RICE therapy.  Also recommended warm Epsom salt soaks.  Patient was advised if the results of the x-ray are abnormal, recommend follow-up with orthopedics.  Patient was also advised that if her results are normal and she continues to experience pain, further recommend follow-up.  Patient was in agreement with this plan of care and verbalizes understanding.  All questions were answered.  Patient stable for discharge.   Final Clinical Impressions(s) / UC Diagnoses   Final diagnoses:  None   Discharge Instructions   None    ED Prescriptions   None    PDMP not reviewed this encounter.   Gilmer Etta PARAS, NP 01/26/24 2018

## 2024-01-29 ENCOUNTER — Ambulatory Visit: Payer: Self-pay

## 2024-01-29 NOTE — Telephone Encounter (Signed)
 Pt scheduled and added to waitlist

## 2024-01-29 NOTE — Telephone Encounter (Signed)
 FYI Only or Action Required?: Action required by provider: request for appointment.  Patient was last seen in primary care on 11/04/2023 by Tobie Suzzane POUR, MD.  Called Nurse Triage reporting Rash and BLE swelling.  Symptoms began several days ago.  Interventions attempted: Nothing.  Symptoms are: gradually worsening.  Triage Disposition: See PCP When Office is Open (Within 3 Days)  Patient/caregiver understands and will follow disposition?: Yes, will follow disposition  Copied from CRM [id number]. Topic: Clinical - Red Word Triage >> Jan 29, 2024  1:38 PM Fonda T wrote: Red Word that prompted transfer to Nurse Triage: Patient states she is having increased swelling on ankles and feet, and rash on chest and shoulders. Reason for Disposition  [1] MILD swelling of both ankles (i.e., pedal edema) AND [2] new-onset or getting worse  Answer Assessment - Initial Assessment Questions 1. ONSET: When did the swelling start? (e.g., minutes, hours, days)     Weeks ago, worse over last 3 days 2. LOCATION: What part of the leg is swollen?  Are both legs swollen or just one leg?     Below the knee,  3. SEVERITY: How bad is the swelling? (e.g., localized; mild, moderate, severe)     Mild/moderate, better when elevated 4. REDNESS: Is there redness or signs of infection?     denies 5. PAIN: Is the swelling painful to touch? If Yes, ask: How painful is it?   (Scale 1-10; mild, moderate or severe)     denies 6. FEVER: Do you have a fever? If Yes, ask: What is it, how was it measured, and when did it start?      denies 7. CAUSE: What do you think is causing the leg swelling?     Unsure, did travel but was swollen before travel 8. MEDICAL HISTORY: Do you have a history of blood clots (e.g., DVT), cancer, heart failure, kidney disease, or liver failure?     afib 10. OTHER SYMPTOMS: Do you have any other symptoms? (e.g., chest pain, difficulty breathing)       denies 11.  PREGNANCY: Is there any chance you are pregnant? When was your last menstrual period?       denies  Answer Assessment - Initial Assessment Questions 1. CALLER DIAGNOSIS: What do you think is causing the rash? (e.g., athlete's foot, chickenpox, hives, impetigo)     Used new soap yesterday, drank new peach drink as well 2. LOCALIZED OR WIDESPREAD:  Is the rash all over (widespread) or mostly just in one area of the body (localized)?      Chest and shoulders 3. NEW MEDICINES: Are you taking any new medicine?     denies 4. APPEARANCE of RASH: What does the rash look like? What color is it?  Note: It is difficult to assess rash color in people with darker-colored skin. When this situation occurs, simply ask the caller to describe what they see.     Red dots 5. FEVER: Do you have a fever? If Yes, ask: What is your temperature, how was it measured, and when did it start?     denies 6. PREGNANCY: Is there any chance you are pregnant? When was your last menstrual period?     denies  Protocols used: Rash - Guideline Selection-A-AH, Leg Swelling and Edema-A-AH

## 2024-02-02 ENCOUNTER — Other Ambulatory Visit: Payer: Self-pay

## 2024-02-02 ENCOUNTER — Ambulatory Visit

## 2024-02-02 VITALS — BP 138/84 | HR 85 | Ht 69.0 in | Wt 321.1 lb

## 2024-02-02 DIAGNOSIS — R600 Localized edema: Secondary | ICD-10-CM | POA: Diagnosis not present

## 2024-02-02 DIAGNOSIS — R21 Rash and other nonspecific skin eruption: Secondary | ICD-10-CM

## 2024-02-02 DIAGNOSIS — R7303 Prediabetes: Secondary | ICD-10-CM | POA: Diagnosis not present

## 2024-02-02 DIAGNOSIS — Z6841 Body Mass Index (BMI) 40.0 and over, adult: Secondary | ICD-10-CM

## 2024-02-02 MED ORDER — CLOTRIMAZOLE-BETAMETHASONE 1-0.05 % EX CREA: 1.0000 | TOPICAL_CREAM | Freq: Two times a day (BID) | CUTANEOUS | 2 refills | Status: AC

## 2024-02-02 MED ORDER — METFORMIN HCL ER (OSM) 1000 MG PO TB24: 1000.0000 mg | ORAL_TABLET | Freq: Every day | ORAL | 1 refills | Status: DC

## 2024-02-02 MED ORDER — METFORMIN HCL ER 500 MG PO TB24: 500.0000 mg | ORAL_TABLET | Freq: Two times a day (BID) | ORAL | 3 refills | Status: AC

## 2024-02-02 NOTE — Assessment & Plan Note (Signed)
 She had previously lost weight, but has gained most of the weight back over the past year.  She was 304 lbs on 08/27/23 and her weight today was 321 lbs.   Her Metformin  was increased to 1,000 mg.  We also discussed need for healthy diet and regular exercise to achieve weight loss.

## 2024-02-02 NOTE — Assessment & Plan Note (Signed)
 Improved since making appt.  She recently went on a car ride to [place]  and did not wear compression hose/socks.   Will check CMP for further evaluation.

## 2024-02-02 NOTE — Assessment & Plan Note (Signed)
 Metformin  was increased to 1000 mg today. Will recheck A1c today.

## 2024-02-02 NOTE — Progress Notes (Addendum)
 Established Patient Office Visit  Subjective   Patient ID: Deborah Riddle, female    DOB: July 21, 1985  Age: 38 y.o. MRN: [id number]  Chief Complaint  Patient presents with   Medical Management of Chronic Issues    Bilateral Swelling below the knee and a rash across her chest and above her armpits and down the side of the right breast    Rash    Patient Active Problem List   Diagnosis Date Noted   Edema of lower extremity 02/02/2024   Acute non-recurrent ethmoidal sinusitis 11/04/2023   Adult BMI 40.0-44.9 kg/sq m (HCC) 09/11/2023   Elevated LDL cholesterol level 09/11/2023   History of PCOS 09/11/2023   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   Menopausal and female climacteric states 09/11/2023   Prediabetes 08/06/2023   Hyperlipidemia 07/23/2023   Polycystic disease, ovaries    Sickle cell trait (HCC)    Vitamin D  deficiency    Back pain 05/30/2023   Right flank pain 11/01/2022   Encounter for routine adult physical exam with abnormal findings 10/07/2022   Hx of eczema 10/07/2022   OSA (obstructive sleep apnea) 01/10/2022   Incomplete spontaneous abortion 07/07/2020   Dyspepsia    Wound dehiscence 06/18/2018   Hidradenitis 05/19/2018   Abdominal pain 05/13/2018   Morbid obesity (HCC) 12/22/2014   Chest pain 09/23/2011   Paroxysmal atrial fibrillation (HCC)    Palpitations 03/23/2009      Review of Systems  Skin:  Positive for rash.      Objective:     BP 138/84   Pulse 85   Ht 5' 9 (1.753 m)   Wt (!) 321 lb 1.9 oz (145.7 kg)   LMP 01/18/2024 (Approximate)   SpO2 95%   BMI 47.42 kg/m  BP Readings from Last 3 Encounters:  02/02/24 138/84  01/26/24 114/74  11/04/23 138/80   Wt Readings from Last 3 Encounters:  02/02/24 (!) 321 lb 1.9 oz (145.7 kg)  11/04/23 (!) 313 lb 6.4 oz (142.2 kg)  08/27/23 (!) 304 lb (137.9 kg)     Physical Exam Vitals and nursing note reviewed.  Constitutional:      Appearance: She is obese.  HENT:     Head:  Normocephalic.  Eyes:     Extraocular Movements: Extraocular movements intact.     Pupils: Pupils are equal, round, and reactive to light.  Cardiovascular:     Rate and Rhythm: Normal rate and regular rhythm.  Pulmonary:     Effort: Pulmonary effort is normal.     Breath sounds: Normal breath sounds.  Musculoskeletal:     Cervical back: Normal range of motion and neck supple.  Skin:    Findings: Rash present. Rash is vesicular (grouped on chest and upper arms).      Neurological:     Mental Status: She is alert and oriented to person, place, and time.  Psychiatric:        Mood and Affect: Mood normal.        Thought Content: Thought content normal.      No results found for any visits on 02/02/24.  Last CBC Lab Results  Component Value Date   WBC 8.3 11/04/2023   HGB 12.0 11/04/2023   HCT 37.5 11/04/2023   MCV 74 (L) 11/04/2023   MCH 23.7 (L) 11/04/2023   RDW 17.9 (H) 11/04/2023   PLT CANCELED 11/04/2023   Last metabolic panel Lab Results  Component Value Date   GLUCOSE 83 07/23/2023   NA 139  07/23/2023   K 4.4 07/23/2023   CL 104 07/23/2023   CO2 23 07/23/2023   BUN 11 07/23/2023   CREATININE 0.82 07/23/2023   EGFR 93 07/23/2023   CALCIUM 9.0 07/23/2023   PROT 7.1 07/23/2023   ALBUMIN 4.1 07/23/2023   LABGLOB 3.0 07/23/2023   AGRATIO 1.4 10/07/2022   BILITOT 0.2 07/23/2023   ALKPHOS 111 07/23/2023   AST 15 07/23/2023   ALT 12 07/23/2023   ANIONGAP 8 11/05/2022      The 10-year ASCVD risk score (Arnett DK, et al., 2019) is: 0.8%    Assessment & Plan:   Problem List Items Addressed This Visit       Other   Morbid obesity (HCC)   She had previously lost weight, but has gained most of the weight back over the past year.  She was 304 lbs on 08/27/23 and her weight today was 321 lbs.   Her Metformin  was increased to 1,000 mg.  We also discussed need for healthy diet and regular exercise to achieve weight loss.       Relevant Medications    metFORMIN  (GLUCOPHAGE -XR) 500 MG 24 hr tablet   Prediabetes - Primary   Metformin  was increased to 1000 mg today. Will recheck A1c today.      Relevant Medications   metFORMIN  (GLUCOPHAGE -XR) 500 MG 24 hr tablet   Other Relevant Orders   CMP14+EGFR   HgB A1c   Edema of lower extremity   Improved since making appt.  She recently went on a car ride to [place]  and did not wear compression hose/socks.   Will check CMP for further evaluation.        Relevant Orders   CMP14+EGFR   Other Visit Diagnoses       Rash       Relevant Medications   clotrimazole -betamethasone  (LOTRISONE ) cream       Return in about 3 months (around 05/04/2024) for for yearly physical.    Leita Longs, FNP

## 2024-02-03 LAB — CMP14+EGFR
ALT: 16 IU/L (ref 0–32)
AST: 17 IU/L (ref 0–40)
Albumin: 4 g/dL (ref 3.9–4.9)
Alkaline Phosphatase: 100 IU/L (ref 44–121)
BUN/Creatinine Ratio: 14 (ref 9–23)
BUN: 11 mg/dL (ref 6–24)
Bilirubin Total: <0.2 mg/dL (ref 0.0–1.2)
CO2: 20 mmol/L (ref 20–29)
Calcium: 9.3 mg/dL (ref 8.7–10.2)
Chloride: 107 mmol/L — ABNORMAL HIGH (ref 96–106)
Creatinine, Ser: 0.81 mg/dL (ref 0.57–1.00)
Globulin, Total: 3.1 g/dL (ref 1.5–4.5)
Glucose: 78 mg/dL (ref 70–99)
Potassium: 4.2 mmol/L (ref 3.5–5.2)
Sodium: 141 mmol/L (ref 134–144)
Total Protein: 7.1 g/dL (ref 6.0–8.5)
eGFR: 94 mL/min/1.73 (ref >59)

## 2024-02-03 LAB — HEMOGLOBIN A1C
Est. average glucose Bld gHb Est-mCnc: 117 mg/dL
Hgb A1c MFr Bld: 5.7 % — ABNORMAL HIGH (ref 4.8–5.6)

## 2024-03-16 ENCOUNTER — Other Ambulatory Visit: Payer: Self-pay | Admitting: Obstetrics and Gynecology

## 2024-03-16 DIAGNOSIS — Z1231 Encounter for screening mammogram for malignant neoplasm of breast: Secondary | ICD-10-CM

## 2024-03-21 ENCOUNTER — Emergency Department (HOSPITAL_COMMUNITY)

## 2024-03-21 ENCOUNTER — Encounter (HOSPITAL_COMMUNITY): Payer: Self-pay

## 2024-03-21 ENCOUNTER — Emergency Department (HOSPITAL_COMMUNITY)
Admission: EM | Admit: 2024-03-21 | Discharge: 2024-03-21 | Disposition: A | Discharge status: Home / Self Care | Attending: Emergency Medicine | Admitting: Emergency Medicine

## 2024-03-21 DIAGNOSIS — R079 Chest pain, unspecified: Secondary | ICD-10-CM

## 2024-03-21 DIAGNOSIS — R0789 Other chest pain: Secondary | ICD-10-CM | POA: Insufficient documentation

## 2024-03-21 LAB — TROPONIN I (HIGH SENSITIVITY): Troponin I (High Sensitivity): 2 ng/L (ref <18)

## 2024-03-21 LAB — BASIC METABOLIC PANEL WITH GFR
Anion gap: 10 (ref 5–15)
BUN: 14 mg/dL (ref 6–20)
CO2: 25 mmol/L (ref 22–32)
Calcium: 8.8 mg/dL — ABNORMAL LOW (ref 8.9–10.3)
Chloride: 104 mmol/L (ref 98–111)
Creatinine, Ser: 0.86 mg/dL (ref 0.44–1.00)
GFR, Estimated: >60 mL/min (ref >60)
Glucose, Bld: 113 mg/dL — ABNORMAL HIGH (ref 70–99)
Potassium: 3.6 mmol/L (ref 3.5–5.1)
Sodium: 139 mmol/L (ref 135–145)

## 2024-03-21 LAB — CBC
HCT: 37.5 % (ref 36.0–46.0)
Hemoglobin: 12.7 g/dL (ref 12.0–15.0)
MCH: 24.3 pg — ABNORMAL LOW (ref 26.0–34.0)
MCHC: 33.9 g/dL (ref 30.0–36.0)
MCV: 71.7 fL — ABNORMAL LOW (ref 80.0–100.0)
Platelets: ADEQUATE K/uL (ref 150–400)
RBC: 5.23 MIL/uL — ABNORMAL HIGH (ref 3.87–5.11)
RDW: 17.9 % — ABNORMAL HIGH (ref 11.5–15.5)
WBC: 9.3 K/uL (ref 4.0–10.5)
nRBC: 0 % (ref 0.0–0.2)

## 2024-03-21 LAB — D-DIMER, QUANTITATIVE: D-Dimer, Quant: 0.33 ug{FEU}/mL (ref 0.00–0.50)

## 2024-03-21 MED ORDER — MELOXICAM 15 MG PO TBDP: 15.0000 mg | ORAL_TABLET | Freq: Every day | ORAL | 0 refills | Status: AC

## 2024-03-21 NOTE — Discharge Instructions (Signed)
 Thankfully all of your testing has been normal, there are no signs of blood clot, no signs of lung abnormalities or heart abnormalities, no signs of heart attack, I would like for you to take your medications exactly as prescribed and follow-up closely with your doctor.  This may be related in someway to underlying anxiety or sometimes you can have some inflammation in the chest that can make you feel this way.  I have prescribed an anti-inflammatory for you to try, it is called meloxicam , take 1 pill a day for the next 2 weeks as needed for ongoing pain

## 2024-03-21 NOTE — ED Provider Notes (Signed)
 [place] EMERGENCY DEPARTMENT AT [facility] Provider Note   CSN: [id number] Arrival date & time: 03/21/24  2100     Patient presents with: Chest Pain   Deborah Riddle is a 38 y.o. female.    Chest Pain  This patient is a 38 year old female, she has a history of paroxysmal atrial fibrillation on propranolol , she has borderline diabetes on metformin , she has no history of cardiac disease and does not smoke or drink.  She is very obese.  She presents to the hospital today with a complaint of some chest discomfort that started this evening, she noticed that it was acute in onset while she was sitting down in the middle of her chest with no radiation into the arms the neck the chin or the back, it has no association with shortness of breath nausea or diaphoresis and is not associated with any coughing or fevers.  It got worse when she bent over and got better when she sat up, it is still present but mild.  The patient reports that she tries to walk around the block 20 minutes every night and she never has any exertional symptoms.    Prior to Admission medications   Medication Sig Start Date End Date Taking? Authorizing Provider  Meloxicam  15 MG TBDP Take 15 mg by mouth daily for 14 days. 03/21/24 04/04/24 Yes Cleotilde Rogue, MD  clotrimazole -betamethasone  (LOTRISONE ) cream Apply 1 Application topically 2 (two) times daily. 02/02/24   Bevely Doffing, FNP  medroxyPROGESTERone (PROVERA) 5 MG tablet Take by mouth. 10/08/23   [provider]  metFORMIN  (GLUCOPHAGE -XR) 500 MG 24 hr tablet Take 1 tablet (500 mg total) by mouth 2 (two) times daily with a meal. 02/02/24   Bevely Doffing, FNP  propranolol  (INDERAL ) 10 MG tablet Take 1 tablet (10 mg total) by mouth 4 (four) times daily as needed (palpitations). 01/21/23   Nahser, Aleene PARAS, MD    Allergies: Dificid [fidaxomicin], Amoxicillin, Clindamycin, Clindamycin/lincomycin, and Heparin    Review of Systems  Cardiovascular:   Positive for chest pain.  All other systems reviewed and are negative.   Updated Vital Signs BP 135/81 (BP Location: Right Arm)   Pulse 82   Temp 99.1 F (37.3 C) (Oral)   Resp 20   Ht 1.753 m (5' 9)   Wt (!) 145.2 kg   LMP 02/19/2024 (Approximate)   SpO2 96%   BMI 47.26 kg/m   Physical Exam Vitals and nursing note reviewed.  Constitutional:      General: She is not in acute distress.    Appearance: She is well-developed.  HENT:     Head: Normocephalic and atraumatic.     Mouth/Throat:     Pharynx: No oropharyngeal exudate.  Eyes:     General: No scleral icterus.       Right eye: No discharge.        Left eye: No discharge.     Conjunctiva/sclera: Conjunctivae normal.     Pupils: Pupils are equal, round, and reactive to light.  Neck:     Thyroid : No thyromegaly.     Vascular: No JVD.  Cardiovascular:     Rate and Rhythm: Normal rate and regular rhythm.     Heart sounds: Normal heart sounds. No murmur heard.    No friction rub. No gallop.  Pulmonary:     Effort: Pulmonary effort is normal. No respiratory distress.     Breath sounds: Normal breath sounds. No wheezing or rales.  Chest:  Chest wall: No tenderness.  Abdominal:     General: Bowel sounds are normal. There is no distension.     Palpations: Abdomen is soft. There is no mass.     Tenderness: There is no abdominal tenderness.  Musculoskeletal:        General: No tenderness. Normal range of motion.     Cervical back: Normal range of motion and neck supple.     Right lower leg: No edema.     Left lower leg: No edema.  Lymphadenopathy:     Cervical: No cervical adenopathy.  Skin:    General: Skin is warm and dry.     Findings: No erythema or rash.  Neurological:     Mental Status: She is alert.     Coordination: Coordination normal.  Psychiatric:        Behavior: Behavior normal.     (all labs ordered are listed, but only abnormal results are displayed) Labs Reviewed  CBC - Abnormal; Notable  for the following components:      Result Value   RBC 5.23 (*)    MCV 71.7 (*)    MCH 24.3 (*)    RDW 17.9 (*)    All other components within normal limits  BASIC METABOLIC PANEL WITH GFR - Abnormal; Notable for the following components:   Glucose, Bld 113 (*)    Calcium 8.8 (*)    All other components within normal limits  D-DIMER, QUANTITATIVE  TROPONIN I (HIGH SENSITIVITY)    EKG: EKG Interpretation Date/Time:  Sunday March 21 2024 21:12:33 EDT Ventricular Rate:  69 PR Interval:  174 QRS Duration:  100 QT Interval:  395 QTC Calculation: 424 R Axis:   84  Text Interpretation: Sinus rhythm Low voltage, precordial leads Confirmed by Cleotilde Rogue ([id number]) on 03/21/2024 9:32:56 PM  Radiology: [facility] Chest Portable 1 View Result Date: 03/21/2024 CLINICAL DATA:  Chest pain EXAM: PORTABLE CHEST 1 VIEW COMPARISON:  11/05/2022 FINDINGS: The heart size and mediastinal contours are within normal limits. Both lungs are clear. The visualized skeletal structures are unremarkable. IMPRESSION: No active disease. Electronically Signed   By: Franky Crease M.D.   On: 03/21/2024 23:00     Procedures   Medications Ordered in the ED - No data to display                                  Medical Decision Making Amount and/or Complexity of Data Reviewed Labs: ordered.  Risk Prescription drug management.    This patient presents to the ED for concern of chest pain, this involves an extensive number of treatment options, and is a complaint that carries with it a high risk of complications and morbidity.  The differential diagnosis includes seems less likely to be cardiac disease given the nonexertional component of this, she is not in atrial fibrillation clinically but will check an EKG, check a chest x-ray to rule out pneumonia, there is no shortness of breath hypoxia or tachycardia to suggest that this is pulmonary embolism Reportedly the patient is also taking Provera   Co morbidities /  Chronic conditions that complicate the patient evaluation  Very obese   Additional history obtained:  Additional history obtained from EMR External records from outside source obtained and reviewed including medical record, office visits for prediabetes with her family medicine doctor, no admissions to the hospital in the last couple of years, follows with cardiology for  atrial fibrillation Echocardiogram in 2022 showed ejection fraction of 60 to 65% Cardiac monitoring occasional PVCs, she had 1 run of ventricular tachycardia lasting 9 beats, that was in June 2024 The patient is not having any palpitations today   Lab Tests:  I Ordered, and personally interpreted labs.  The pertinent results include: Negative D-dimer troponin CBC and metabolic panel   Cardiac Monitoring: / EKG:  The patient was maintained on a cardiac monitor.  I personally viewed and interpreted the cardiac monitored which showed an underlying rhythm of: Normal rhythm   Problem List / ED Course / Critical interventions / Medication management  The patient does not appear in any distress, all of the labs were unremarkable as is the chest x-ray, the patient is agreeable to return should symptoms worsen, she is low risk for pathologic findings of her pain   Social Determinants of Health:  None   Test / Admission - Considered:  Considered admission but low risk for pathologic causes  I have discussed with the patient at the bedside the results, and the meaning of these results.  They have had opportunity to ask questions,  expressed their understanding to the need for follow-up with primary care physician      Final diagnoses:  Nonspecific chest pain    ED Discharge Orders          Ordered    Meloxicam  15 MG TBDP  Daily        03/21/24 2227               Cleotilde Rogue, MD 03/21/24 2305

## 2024-03-21 NOTE — ED Triage Notes (Signed)
 Pt comes in for CP that started tonight. Pt denies any n/v. Pt has hx of a-fib, no blood thinners. Pt was doing nothing when pain started. Pain has worsened over time. Pt denies any trauma or pulling any muscle. Pt is A&Ox4.

## 2024-03-29 ENCOUNTER — Ambulatory Visit
Admission: RE | Admit: 2024-03-29 | Discharge: 2024-03-29 | Disposition: A | Discharge status: Home / Self Care | Source: Ambulatory Visit | Attending: Obstetrics and Gynecology | Admitting: Obstetrics and Gynecology

## 2024-03-29 DIAGNOSIS — Z1231 Encounter for screening mammogram for malignant neoplasm of breast: Secondary | ICD-10-CM

## 2024-04-07 ENCOUNTER — Ambulatory Visit: Payer: Self-pay

## 2024-04-21 ENCOUNTER — Encounter

## 2024-04-26 ENCOUNTER — Encounter: Payer: Self-pay | Admitting: Family Medicine

## 2024-05-05 ENCOUNTER — Ambulatory Visit: Admitting: Dermatology

## 2024-05-22 ENCOUNTER — Other Ambulatory Visit: Payer: Self-pay

## 2024-05-22 ENCOUNTER — Emergency Department (HOSPITAL_COMMUNITY): Admission: EM | Admit: 2024-05-22 | Discharge: 2024-05-22 | Disposition: A | Discharge status: Home / Self Care

## 2024-05-22 ENCOUNTER — Encounter (HOSPITAL_COMMUNITY): Payer: Self-pay

## 2024-05-22 DIAGNOSIS — M25561 Pain in right knee: Secondary | ICD-10-CM | POA: Insufficient documentation

## 2024-05-22 MED ORDER — KETOROLAC TROMETHAMINE 15 MG/ML IJ SOLN
15.0000 mg | Freq: Once | INTRAMUSCULAR | Status: AC
  Administered 2024-05-22: 15 mg via INTRAMUSCULAR
  Filled 2024-05-22: qty 1

## 2024-05-22 MED ORDER — DICLOFENAC SODIUM 1 % EX GEL: 4.0000 g | Freq: Four times a day (QID) | CUTANEOUS | 0 refills | Status: AC

## 2024-05-22 NOTE — ED Provider Notes (Signed)
 [place] EMERGENCY DEPARTMENT AT [facility] Provider Note   CSN: [id number] Arrival date & time: 05/22/24  9381     Patient presents with: Knee Pain (right)   Deborah Riddle is a 38 y.o. female.   38 year old female presents for evaluation of right knee pain.  States she has been walking more often lately as she is trying to lose weight.  States she woke up in the middle of the night with medial right knee pain and is having hard time bearing weight.  Denies any falls or trauma.  Denies any other symptoms or concerns.   Knee Pain Associated symptoms: no back pain and no fever        Prior to Admission medications   Medication Sig Start Date End Date Taking? Authorizing Provider  diclofenac Sodium (VOLTAREN) 1 % GEL Apply 4 g topically 4 (four) times daily. 05/22/24  Yes Cadin Luka L, DO  clotrimazole -betamethasone  (LOTRISONE ) cream Apply 1 Application topically 2 (two) times daily. 02/02/24   Bevely Doffing, FNP  medroxyPROGESTERone (PROVERA) 5 MG tablet Take by mouth. 10/08/23   [provider]  metFORMIN  (GLUCOPHAGE -XR) 500 MG 24 hr tablet Take 1 tablet (500 mg total) by mouth 2 (two) times daily with a meal. 02/02/24   Bevely Doffing, FNP  propranolol  (INDERAL ) 10 MG tablet Take 1 tablet (10 mg total) by mouth 4 (four) times daily as needed (palpitations). 01/21/23   Nahser, Aleene PARAS, MD    Allergies: Dificid [fidaxomicin], Amoxicillin, Clindamycin, Clindamycin/lincomycin, and Heparin    Review of Systems  Constitutional:  Negative for chills and fever.  HENT:  Negative for ear pain and sore throat.   Eyes:  Negative for pain and visual disturbance.  Respiratory:  Negative for cough and shortness of breath.   Cardiovascular:  Negative for chest pain and palpitations.  Gastrointestinal:  Negative for abdominal pain and vomiting.  Genitourinary:  Negative for dysuria and hematuria.  Musculoskeletal:  Negative for arthralgias and back pain.        Admits right knee pain  Skin:  Negative for color change and rash.  Neurological:  Negative for seizures and syncope.  All other systems reviewed and are negative.   Updated Vital Signs BP (!) 159/85 (BP Location: Right Arm)   Pulse 78   Temp 98.2 F (36.8 C) (Oral)   Resp 16   Ht 5' 8 (1.727 m)   Wt (!) 145.2 kg   LMP 04/03/2024 (Approximate)   SpO2 96%   BMI 48.66 kg/m   Physical Exam Vitals and nursing note reviewed.  Constitutional:      General: She is not in acute distress.    Appearance: Normal appearance. She is well-developed. She is not ill-appearing.  HENT:     Head: Normocephalic and atraumatic.  Eyes:     Conjunctiva/sclera: Conjunctivae normal.  Cardiovascular:     Rate and Rhythm: Normal rate and regular rhythm.     Heart sounds: No murmur heard. Pulmonary:     Effort: Pulmonary effort is normal. No respiratory distress.     Breath sounds: Normal breath sounds.  Abdominal:     Palpations: Abdomen is soft.     Tenderness: There is no abdominal tenderness.  Musculoskeletal:        General: No swelling.     Cervical back: Neck supple.     Comments: There is minimal swelling and mild ttp over the medial aspect of the knee, full ROM although with some pain of the right  knee   Skin:    General: Skin is warm and dry.     Capillary Refill: Capillary refill takes less than 2 seconds.  Neurological:     Mental Status: She is alert.  Psychiatric:        Mood and Affect: Mood normal.     (all labs ordered are listed, but only abnormal results are displayed) Labs Reviewed - No data to display  EKG: None  Radiology: No results found.   Procedures   Medications Ordered in the ED  ketorolac  (TORADOL ) 15 MG/ML injection 15 mg (has no administration in time range)                                    Medical Decision Making Patient here for right knee pain after increasing her workout regimen.  She has been walking more.  Will put an Ace wrap on and  give her Toradol  here.  Advised Tylenol  Motrin  as needed will give her prescription for Voltaren gel.  Advised rest ice elevation and compression as needed to help with her symptoms.  Advise follow-up with primary care if her symptoms do not improve otherwise return to the ER for any new or worsening symptoms.  She feels comfortable being discharged home.  Problems Addressed: Acute pain of right knee: acute illness or injury  Amount and/or Complexity of Data Reviewed External Data Reviewed: notes.    Details: Prior ED records reviewed and patient seen 03-21-24 for chest pain with a negative workup  Risk OTC drugs. Prescription drug management.     Final diagnoses:  Acute pain of right knee    ED Discharge Orders          Ordered    diclofenac Sodium (VOLTAREN) 1 % GEL  4 times daily        05/22/24 0744               Gennaro Duwaine CROME, DO 05/22/24 [phone]

## 2024-05-22 NOTE — Discharge Instructions (Signed)
 Use your Ace wrap as needed for support and swelling.  Ice and elevate your knee as needed multiple times a day.  You can take ibuprofen  and Tylenol  as needed.  You can alternate them every 3 hours.  Use your Voltaren gel up to 4 times a day to help with pain and swelling.

## 2024-05-22 NOTE — ED Triage Notes (Signed)
 Patient come in POV for discomfort in her knee for the past week yesterday was unable to weight bear on the right knee. Noted her legs have been swelling. Pain described as sharpe.

## 2024-06-14 ENCOUNTER — Ambulatory Visit: Payer: Self-pay

## 2024-06-14 NOTE — Telephone Encounter (Signed)
Noted going to urgent care  

## 2024-06-14 NOTE — Telephone Encounter (Signed)
 FYI Only or Action Required?: Action required by provider: request for appointment.  Patient was last seen in primary care on 02/02/2024 by Bevely Doffing, FNP.  Called Nurse Triage reporting Knee Pain.  Symptoms began several days ago.  Interventions attempted: Rest, hydration, or home remedies.  Symptoms are: unchanged.Right knee pain x several weeks after starting to exercise. No swelling. Also has palpitations. No availability today and declines a different office. Will go to UC.  Triage Disposition: See PCP When Office is Open (Within 3 Days)  Patient/caregiver understands and will follow disposition?: Yes      Copied from CRM #[id number]. Topic: Clinical - Red Word Triage >> Jun 14, 2024 10:24 AM Deborah Riddle wrote: Red Word that prompted transfer to Nurse Triage: Joint pain / would like shot in knee. Hard to walk Answer Assessment - Initial Assessment Questions 1. LOCATION and RADIATION: Where is the pain located?      Right knee 2. QUALITY: What does the pain feel like?  (e.g., sharp, dull, aching, burning)     Ache and sharp pain 3. SEVERITY: How bad is the pain? What does it keep you from doing?   (Scale 1-10; or mild, moderate, severe)     6-7 4. ONSET: When did the pain start? Does it come and go, or is it there all the time?     1 month ago 5. RECURRENT: Have you had this pain before? If Yes, ask: When, and what happened then?     yes 6. SETTING: Has there been any recent work, exercise or other activity that involved that part of the body?      walking 7. AGGRAVATING FACTORS: What makes the knee pain worse? (e.g., walking, climbing stairs, running)     walking 8. ASSOCIATED SYMPTOMS: Is there any swelling or redness of the knee?     no 9. OTHER SYMPTOMS: Do you have any other symptoms? (e.g., calf pain, chest pain, difficulty breathing, fever)     palpitations 10. PREGNANCY: Is there any chance you are pregnant? When was your last menstrual  period?       no  Protocols used: Knee Pain-A-AH  Reason for Disposition  [1] MODERATE pain (e.g., interferes with normal activities, limping) AND [2] present > 3 days  Answer Assessment - Initial Assessment Questions 1. LOCATION and RADIATION: Where is the pain located?      Right knee 2. QUALITY: What does the pain feel like?  (e.g., sharp, dull, aching, burning)     Ache and sharp pain 3. SEVERITY: How bad is the pain? What does it keep you from doing?   (Scale 1-10; or mild, moderate, severe)     6-7 4. ONSET: When did the pain start? Does it come and go, or is it there all the time?     1 month ago 5. RECURRENT: Have you had this pain before? If Yes, ask: When, and what happened then?     yes 6. SETTING: Has there been any recent work, exercise or other activity that involved that part of the body?      walking 7. AGGRAVATING FACTORS: What makes the knee pain worse? (e.g., walking, climbing stairs, running)     walking 8. ASSOCIATED SYMPTOMS: Is there any swelling or redness of the knee?     no 9. OTHER SYMPTOMS: Do you have any other symptoms? (e.g., calf pain, chest pain, difficulty breathing, fever)     palpitations 10. PREGNANCY: Is there any chance you are pregnant?  When was your last menstrual period?       no  Protocols used: Knee Pain-A-AH

## 2024-06-16 ENCOUNTER — Ambulatory Visit: Payer: Self-pay

## 2024-06-16 NOTE — Telephone Encounter (Signed)
Noted patient scheduled

## 2024-06-16 NOTE — Telephone Encounter (Signed)
 FYI Only or Action Required?: FYI only for provider: appointment scheduled on 06/17/24 at [facility].  Patient was last seen in primary care on 02/02/2024 by Bevely Doffing, FNP.  Called Nurse Triage reporting Arm Pain.  Symptoms began several weeks ago.  Interventions attempted: Nothing.  Symptoms are: stable.  Triage Disposition: See PCP When Office is Open (Within 3 Days)  Patient/caregiver understands and will follow disposition?: Yes Reason for Disposition  [1] Palpitations AND [2] no improvement after using Care Advice  Answer Assessment - Initial Assessment Questions Patient talked to PCP about the elbow pain previously, but has never went away. Denies radiating discomfort. Sees cardiologist, could not be seen until 07/22/24. PCP is out on maternity leave, no available appointments in PCP office. Patient scheduled at [facility]  1. ONSET: When did the pain start?     This week  2. LOCATION: Where is the pain located?     Left elbow and joint pain everywhere  3. PAIN: How bad is the pain? (Scale 0-10; or none, mild, moderate, severe)     Aching, joint,  4. CAUSE: What do you think is causing the arm pain?     Unsure  5. OTHER SYMPTOMS: Do you have any other symptoms? (e.g., neck pain, swelling, rash, fever, numbness, weakness)     Increased heart palpitations over the last month, can feel it skipping beats. Went to UC for it and vitals were normal. Hx of AFIB.  Protocols used: Arm Pain-A-AH, Heart Rate and Heartbeat Questions-A-AH

## 2024-06-17 ENCOUNTER — Ambulatory Visit: Admitting: Family Medicine

## 2024-06-17 ENCOUNTER — Encounter: Payer: Self-pay | Admitting: Family Medicine

## 2024-06-17 ENCOUNTER — Ambulatory Visit: Payer: Self-pay | Admitting: Family Medicine

## 2024-06-17 VITALS — BP 110/76 | HR 80 | Temp 98.4°F | Ht 68.0 in | Wt 318.4 lb

## 2024-06-17 DIAGNOSIS — R002 Palpitations: Secondary | ICD-10-CM

## 2024-06-17 DIAGNOSIS — R232 Flushing: Secondary | ICD-10-CM

## 2024-06-17 DIAGNOSIS — M79602 Pain in left arm: Secondary | ICD-10-CM

## 2024-06-17 LAB — EKG 12-LEAD

## 2024-06-17 LAB — CBC WITH DIFFERENTIAL/PLATELET

## 2024-06-17 NOTE — Progress Notes (Signed)
 Subjective:  Patient ID: Deborah Riddle, female    DOB: 09-15-1985, 38 y.o.   MRN: [id number]  Patient Care Team: Del Wilhelmena Lloyd Sola, FNP as PCP - General (Family Medicine) Nahser, Aleene PARAS, MD (Inactive) as PCP - Cardiology (Cardiology) Charls Pearla LABOR, MD (Inactive) as Attending Physician (Cardiology) Harvey Margo CROME, MD (Inactive) as Consulting Physician (Gastroenterology)   Chief Complaint:  Palpitations (Ongoing issue - upcoming appointment with cardiologist )   HPI: Deborah Riddle is a 38 y.o. female presenting on 06/17/2024 for Palpitations (Ongoing issue - upcoming appointment with cardiologist )    Deborah Riddle is a 38 year old female with atrial fibrillation who presents with increased palpitations and dizziness.  She has a history of atrial fibrillation, typically triggered by caffeine. She also has sleep apnea and believes her weight may be a factor. She experiences palpitations described as a 'big flutter' followed by a return to regular rhythm, occurring every few minutes. Her job is stressful, which may contribute to her symptoms. She does not take any regular medications for atrial fibrillation but uses Tylenol  occasionally. She was first diagnosed with atrial fibrillation at age 24 and has not been on blood thinners due to her age at diagnosis.  Recently, she received a steroid shot in her knee for a strain, which she associates with an increase in palpitations. She has been experiencing dizziness for the past two days and sweating. She wakes up at night feeling hot and cold. She has been having hot flashes and is concerned about menopause.   She reports left arm pain, primarily in the elbow, which worsens with laying on it or using it to open doors. She has been managing the pain with ibuprofen  and Tylenol , which provide temporary relief. She has not undergone any physical therapy for this issue.  She works in a stressful job and is trying to  lose weight by riding and walking to work.          Relevant past medical, surgical, family, and social history reviewed and updated as indicated.  Allergies and medications reviewed and updated. Data reviewed: Chart in Epic.   Past Medical History:  Diagnosis Date   Anemia    Back pain    Body mass index 40.0-44.9, adult (HCC)    Chest pain    Chest pain, unspecified    Constipation    Dysrhythmia    AFib   GERD (gastroesophageal reflux disease)    Infertility, female    Joint pain    Morbid obesity (HCC)    Palpitations    Paroxysmal atrial fibrillation (HCC)    Polycystic disease, ovaries    Sickle cell trait    Sleep apnea    Tobacco use disorder    Vitamin D  deficiency     Past Surgical History:  Procedure Laterality Date   BIOPSY  08/07/2018   Procedure: BIOPSY;  Surgeon: Harvey Margo CROME, MD;  [facility];  Service: Endoscopy;;  gastric bx's   DILATION AND EVACUATION  07/07/2020   Procedure: DILATATION AND EVACUATION;  Surgeon: Rutherford Gain, MD;  Location: [facility];  Service: Gynecology;;   ESOPHAGOGASTRODUODENOSCOPY N/A 08/07/2018   Procedure: ESOPHAGOGASTRODUODENOSCOPY (EGD);  Surgeon: Harvey Margo CROME, MD;  [facility];  Service: Endoscopy;  Laterality: N/A;  3:00pm   HYDRADENITIS EXCISION Right 05/29/2018   Procedure: EXCISION HIDRADENITIS AXILLA;  Surgeon: Kallie Manuelita BROCKS, MD;  Location: [facility];  Service: General;  Laterality: Right;  MALONEY DILATION N/A 08/07/2018   Procedure: AGAPITO DILATION;  Surgeon: Harvey Margo CROME, MD;  [facility];  Service: Endoscopy;  Laterality: N/A;   none     SAVORY DILATION N/A 08/07/2018   Procedure: SAVORY DILATION;  Surgeon: Harvey Margo CROME, MD;  [facility];  Service: Endoscopy;  Laterality: N/A;   WISDOM TOOTH EXTRACTION      Social History   Socioeconomic History   Marital status: Married    Spouse name: CHRISTOPHER    Number of children: Not on file   Years of  education: Not on file   Highest education level: Bachelor's degree (e.g., BA, AB, BS)  Occupational History   Occupation: PHLEBOTOMIST    Employer: [employer]  Tobacco Use   Smoking status: Former    Current packs/day: 0.00    Average packs/day: 1 pack/day for 12.0 years (12.0 ttl pk-yrs)    Types: Cigarettes    Start date: 01/21/2004    Quit date: 09/10/2011    Years since quitting: 12.7   Smokeless tobacco: Never  Vaping Use   Vaping status: Never Used  Substance and Sexual Activity   Alcohol use: Never    Alcohol/week: 0.0 standard drinks of alcohol   Drug use: No   Sexual activity: Yes  Other Topics Concern   Not on file  Social History Narrative   Lives in [place], [place], [place] with husband.  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 HAS ONE ADOPTED SON: 9 YO.   Social Drivers of Corporate Investment Banker Strain: Low Risk  (11/04/2023)   Overall Financial Resource Strain (CARDIA)    Difficulty of Paying Living Expenses: Not hard at all  Food Insecurity: Unknown (06/17/2024)   Hunger Vital Sign    Worried About Running Out of Food in the Last Year: Never true    Ran Out of Food in the Last Year: Patient declined  Transportation Needs: No Transportation Needs (06/17/2024)   PRAPARE - Administrator, Civil Service (Medical): No    Lack of Transportation (Non-Medical): No  Physical Activity: Insufficiently Active (11/04/2023)   Exercise Vital Sign    Days of Exercise per Week: 2 days    Minutes of Exercise per Session: 30 min  Stress: No Stress Concern Present (11/04/2023)   [employer] of Occupational Health - Occupational Stress Questionnaire    Feeling of Stress : Not at all  Social Connections: Unknown (06/17/2024)   Social Connection and Isolation Panel    Frequency of Communication with Friends and Family: More than three times a week    Frequency of Social Gatherings with Friends and Family: More than three times a week    Attends  Religious Services: More than 4 times per year    Active Member of [employer] or Organizations: Patient declined    Attends Banker Meetings: Not on file    Marital Status: Married  Intimate Partner Violence: Not on file    Outpatient Encounter Medications as of 06/17/2024  Medication Sig   diclofenac  Sodium (VOLTAREN ) 1 % GEL Apply 4 g topically 4 (four) times daily.   medroxyPROGESTERone (PROVERA) 5 MG tablet Take by mouth.   metFORMIN  (GLUCOPHAGE -XR) 500 MG 24 hr tablet Take 1 tablet (500 mg total) by mouth 2 (two) times daily with a meal.   propranolol  (INDERAL ) 10 MG tablet Take 1 tablet (10 mg total) by mouth 4 (four) times daily as needed (palpitations).   clotrimazole -betamethasone  (LOTRISONE ) cream  Apply 1 Application topically 2 (two) times daily. (Patient not taking: Reported on 06/17/2024)   No facility-administered encounter medications on file as of 06/17/2024.    Allergies  Allergen Reactions   Dificid [Fidaxomicin] Shortness Of Breath   Amoxicillin Swelling   Clindamycin Diarrhea   Clindamycin/Lincomycin     caused c diff   Heparin Other (See Comments) and Nausea And Vomiting    Lowers BP  Lowers blood pressure    Pertinent ROS per HPI, otherwise unremarkable      Objective:  BP 110/76   Pulse 80   Temp 98.4 F (36.9 C)   Ht 5' 8 (1.727 m)   Wt (!) 318 lb 6.4 oz (144.4 kg)   LMP 04/03/2024 (Approximate)   SpO2 98%   BMI 48.41 kg/m    Wt Readings from Last 3 Encounters:  06/17/24 (!) 318 lb 6.4 oz (144.4 kg)  05/22/24 (!) 320 lb (145.2 kg)  03/21/24 (!) 320 lb (145.2 kg)    Physical Exam Vitals and nursing note reviewed.  Constitutional:      General: She is not in acute distress.    Appearance: Normal appearance. She is morbidly obese. She is not ill-appearing, toxic-appearing or diaphoretic.  HENT:     Head: Normocephalic and atraumatic.     Nose: Nose normal.     Mouth/Throat:     Mouth: Mucous membranes are moist.  Eyes:      Conjunctiva/sclera: Conjunctivae normal.     Pupils: Pupils are equal, round, and reactive to light.  Cardiovascular:     Rate and Rhythm: Normal rate and regular rhythm. No extrasystoles are present.    Heart sounds: Normal heart sounds. No murmur heard.    No friction rub. No gallop.  Pulmonary:     Effort: Pulmonary effort is normal.     Breath sounds: Normal breath sounds.  Abdominal:     General: Bowel sounds are normal.     Palpations: Abdomen is soft.  Musculoskeletal:     Cervical back: Neck supple.     Right lower leg: No edema.     Left lower leg: No edema.     Comments: Left elbow with painful ROM. Full ROM intact but painful per pt. No deformity.   Skin:    General: Skin is warm and dry.     Capillary Refill: Capillary refill takes less than 2 seconds.  Neurological:     General: No focal deficit present.     Mental Status: She is alert and oriented to person, place, and time.  Psychiatric:        Mood and Affect: Mood normal.        Behavior: Behavior normal. Behavior is cooperative.        Thought Content: Thought content normal.        Judgment: Judgment normal.     EKG: SR 71, PR 158 ms, QT 380 ms, no acute ST-T changes, no significant changes from prior EKG. No A-Fib. Rosaline Bruns, FNP-C  Results for orders placed or performed in visit on 06/17/24  EKG 12-Lead   Collection Time: 06/17/24 10:05 AM  Result Value Ref Range   FINAL DIAGNOSIS:         Pertinent labs & imaging results that were available during my care of the patient were reviewed by me and considered in my medical decision making.  Assessment & Plan:  Destani was seen today for palpitations.  Diagnoses and all orders for this visit:  Palpitations -  CBC with Differential/Platelet -     CMP14+EGFR -     TSH -     T4, Free -     Magnesium -     EKG 12-Lead  Left arm pain -     Cancel: DG Elbow 2 Views Left -     DG Elbow 2 Views Left  Hot flashes -     Hormone Panel         Palpitations and history of atrial fibrillation Intermittent palpitations with a history of atrial fibrillation, typically triggered by caffeine. Recent increase in frequency of palpitations, possibly related to a steroid injection in the knee. No current anticoagulation therapy due to age at initial diagnosis. EKG today shows normal rhythm, not in AFib. Differential includes thyroid  dysfunction, electrolyte imbalances, or anemia. - Ordered EKG to assess current rhythm - Ordered blood work to evaluate thyroid  function, electrolytes, and anemia - Follow up with cardiologist next week  Left arm pain Chronic left arm pain, primarily in the elbow, exacerbated by pressure and use. No known injury. Pain partially relieved by ibuprofen  and Tylenol . Concern for possible underlying structural issues such as bone spurs. - Ordered x-ray of left arm to evaluate for structural abnormalities - Recommended Aleve twice a day for two weeks to reduce inflammation  Perimenopausal symptoms Symptoms suggestive of perimenopause, including night sweats and hot flashes. - Ordered labs to evaluate for perimenopausal symptoms - Follow up with primary care provider for further management          Continue all other maintenance medications.  Follow up plan: Return if symptoms worsen or fail to improve.   Educational handout given for palpitations  The above assessment and management plan was discussed with the patient. The patient verbalized understanding of and has agreed to the management plan. Patient is aware to call the clinic if they develop any new symptoms or if symptoms persist or worsen. Patient is aware when to return to the clinic for a follow-up visit. Patient educated on when it is appropriate to go to the emergency department.   Rosaline Bruns, FNP-C [facility] [phone]

## 2024-06-22 ENCOUNTER — Telehealth: Payer: Self-pay | Admitting: Family Medicine

## 2024-06-22 ENCOUNTER — Ambulatory Visit (HOSPITAL_COMMUNITY)
Admission: RE | Admit: 2024-06-22 | Discharge: 2024-06-22 | Disposition: A | Source: Ambulatory Visit | Attending: Family Medicine

## 2024-06-22 NOTE — Telephone Encounter (Signed)
 Returned call and answered questions

## 2024-06-22 NOTE — Telephone Encounter (Signed)
 Copied from CRM [id number]. Topic: Clinical - Lab/Test Results >> Jun 22, 2024  1:26 PM Gustabo D wrote: Pt would like a call back from [facility] about her results

## 2024-06-29 LAB — CMP14+EGFR
ALT: 18 IU/L (ref 0–32)
AST: 23 IU/L (ref 0–40)
Albumin: 4.2 g/dL (ref 3.9–4.9)
Alkaline Phosphatase: 104 IU/L (ref 41–116)
BUN/Creatinine Ratio: 9 (ref 9–23)
BUN: 8 mg/dL (ref 6–24)
Bilirubin Total: 0.4 mg/dL (ref 0.0–1.2)
CO2: 20 mmol/L (ref 20–29)
Calcium: 9.1 mg/dL (ref 8.7–10.2)
Chloride: 104 mmol/L (ref 96–106)
Creatinine, Ser: 0.94 mg/dL (ref 0.57–1.00)
Globulin, Total: 3.3 g/dL (ref 1.5–4.5)
Glucose: 86 mg/dL (ref 70–99)
Potassium: 4.2 mmol/L (ref 3.5–5.2)
Sodium: 140 mmol/L (ref 134–144)
Total Protein: 7.5 g/dL (ref 6.0–8.5)
eGFR: 79 mL/min/1.73 (ref >59)

## 2024-06-29 LAB — CBC WITH DIFFERENTIAL/PLATELET
Basos: 1 %
EOS (ABSOLUTE): 0 x10E3/uL (ref 0.0–0.2)
Eos: 1 %
Hematocrit: 40.3 % (ref 34.0–46.6)
Hemoglobin: 12.5 g/dL (ref 11.1–15.9)
Immature Granulocytes: 0 %
Immature Granulocytes: 0 x10E3/uL (ref 0.0–0.1)
Lymphs: 29 %
MCH: 23.5 pg — AB (ref 26.6–33.0)
MCHC: 31 g/dL — AB (ref 31.5–35.7)
MCV: 76 fL — AB (ref 79–97)
Monocytes Absolute: 0.1 x10E3/uL (ref 0.0–0.4)
Monocytes Absolute: 0.4 x10E3/uL (ref 0.1–0.9)
Monocytes: 5 %
Neutrophils Absolute: 2.1 x10E3/uL (ref 0.7–3.1)
Neutrophils Absolute: 4.5 x10E3/uL (ref 1.4–7.0)
Neutrophils: 64 %
Platelets: 198 x10E3/uL (ref 150–450)
RBC: 5.32 x10E6/uL — AB (ref 3.77–5.28)
RDW: 18.4 % — AB (ref 11.7–15.4)
WBC: 7.1 x10E3/uL (ref 3.4–10.8)

## 2024-06-29 LAB — HORMONE PANEL (T4,TSH,FSH,TESTT,SHBG,DHEA,ETC)
DHEA-Sulfate, LCMS: 73 ug/dL
Estradiol, Serum, MS: 43 pg/mL
Estrone Sulfate: 131 ng/dL
Follicle Stimulating Hormone: 6.8 m[IU]/mL
Free T-3: 2.5 pg/mL
Free Testosterone, Serum: 5.8 pg/mL
Progesterone, Serum: <10 ng/dL
Sex Hormone Binding Globulin: 31.7 nmol/L
T4: 8.3 ug/dL
TSH: 1.9 uU/mL
Testosterone, Serum (Total): 34 ng/dL
Testosterone-% Free: 1.7 %
Triiodothyronine (T-3), Serum: 125 ng/dL

## 2024-06-29 LAB — TSH: TSH: 1.72 u[IU]/mL (ref 0.450–4.500)

## 2024-06-29 LAB — MAGNESIUM: Magnesium: 2.1 mg/dL (ref 1.6–2.3)

## 2024-06-29 LAB — T4, FREE: Free T4: 1.07 ng/dL (ref 0.82–1.77)

## 2024-07-02 ENCOUNTER — Telehealth: Payer: Self-pay

## 2024-07-02 NOTE — Telephone Encounter (Signed)
 scheduled

## 2024-07-02 NOTE — Telephone Encounter (Signed)
 Copied from CRM [id number]. Topic: Clinical - Medication Question >> Jul 02, 2024  9:50 AM Myrick T wrote: Reason for CRM: patient called said she saw her cardiologist on 12/11 as well as Rosaline Bruns on 12/4and was advised that she should get on a GLP1 med for her conditions. Patient says her provider is out on leave and would like to see if another provider would be able to prescribe the GLP1 medication to her. >> Jul 02, 2024  9:57 AM Myrick T wrote: Patient is requesting a call back before the medication is prescribed

## 2024-07-02 NOTE — Telephone Encounter (Signed)
 Copied from CRM [id number]. Topic: Clinical - Medication Question >> Jul 02, 2024  9:50 AM Myrick T wrote: Reason for CRM: patient called said she saw her cardiologist on 12/11 as well as Rosaline Bruns on 12/4and was advised that she should get on a GLP1 med for her conditions. Patient says her provider is out on leave and would like to see if another provider would be able to prescribe the GLP1 medication to her.

## 2024-07-09 ENCOUNTER — Encounter: Payer: Self-pay | Admitting: Family Medicine

## 2024-07-09 ENCOUNTER — Other Ambulatory Visit: Payer: Self-pay | Admitting: Family Medicine

## 2024-07-09 ENCOUNTER — Telehealth: Payer: Self-pay | Admitting: Family Medicine

## 2024-07-09 VITALS — Ht 68.0 in | Wt 320.0 lb

## 2024-07-09 DIAGNOSIS — G4733 Obstructive sleep apnea (adult) (pediatric): Secondary | ICD-10-CM

## 2024-07-09 MED ORDER — ZEPBOUND 2.5 MG/0.5ML ~~LOC~~ SOAJ: 2.5000 mg | SUBCUTANEOUS | 0 refills | Status: DC

## 2024-07-09 NOTE — Progress Notes (Signed)
" ° °  Virtual Visit via Video Note  I connected with Deborah Riddle on 07/09/2024 at 11:20 AM EST by a video enabled telemedicine application and verified that I am speaking with the correct person using two identifiers.  Patient Location: Home Provider Location: Home Office  I discussed the limitations, risks, security, and privacy concerns of performing an evaluation and management service by video and the availability of in person appointments. I also discussed with the patient that there may be a patient responsible charge related to this service. The patient expressed understanding and agreed to proceed.  Subjective: PCP: Terry Wilhelmena Lloyd Hilario, FNP  Chief Complaint  Patient presents with   medicaion management     Pt was recommended to start GLP1 medication by cardiologist    HPI The patient presents today requesting to start Zepbound  per the recommendation of her cardiologist. She has a history of sleep apnea and obesity   ROS: Per HPI Current Medications[1]  Observations/Objective: Today's Vitals   07/09/24 1044  Weight: (!) 320 lb (145.2 kg)  Height: 5' 8 (1.727 m)   Physical Exam Patient is well-developed, well-nourished in no acute distress.  Resting comfortably at home.  Head is normocephalic, atraumatic.  No labored breathing.  Speech is clear and coherent with logical content.  Patient is alert and oriented at baseline.   Assessment and Plan: OSA (obstructive sleep apnea) -     Zepbound ; Inject 2.5 mg into the skin once a week.  Dispense: 2 mL; Refill: 0  Medication refills were encouraged monthly. Common side effects of Zepbound  were reviewed. The patient was encouraged to implement lifestyle changes, including a heart-healthy diet and increased physical activity, while on the treatment regimen.   Follow Up Instructions: No follow-ups on file.   I discussed the assessment and treatment plan with the patient. The patient was provided an opportunity  to ask questions, and all were answered. The patient agreed with the plan and demonstrated an understanding of the instructions.   The patient was advised to call back or seek an in-person evaluation if the symptoms worsen or if the condition fails to improve as anticipated.  The above assessment and management plan was discussed with the patient. The patient verbalized understanding of and has agreed to the management plan.   Saylee Sherrill  Z Bacchus, FNP    [1]  Current Outpatient Medications:    diclofenac  Sodium (VOLTAREN ) 1 % GEL, Apply 4 g topically 4 (four) times daily., Disp: 120 g, Rfl: 0   medroxyPROGESTERone (PROVERA) 5 MG tablet, Take by mouth., Disp: , Rfl:    metFORMIN  (GLUCOPHAGE -XR) 500 MG 24 hr tablet, Take 1 tablet (500 mg total) by mouth 2 (two) times daily with a meal., Disp: 60 tablet, Rfl: 3   propranolol  (INDERAL ) 10 MG tablet, Take 1 tablet (10 mg total) by mouth 4 (four) times daily as needed (palpitations)., Disp: 120 tablet, Rfl: 5   tirzepatide  (ZEPBOUND ) 2.5 MG/0.5ML Pen, Inject 2.5 mg into the skin once a week., Disp: 2 mL, Rfl: 0   clotrimazole -betamethasone  (LOTRISONE ) cream, Apply 1 Application topically 2 (two) times daily. (Patient not taking: Reported on 07/09/2024), Disp: 15 g, Rfl: 2  "

## 2024-07-12 ENCOUNTER — Telehealth: Payer: Self-pay

## 2024-07-12 ENCOUNTER — Other Ambulatory Visit (HOSPITAL_COMMUNITY): Payer: Self-pay

## 2024-07-12 ENCOUNTER — Other Ambulatory Visit: Payer: Self-pay | Admitting: Family Medicine

## 2024-07-12 DIAGNOSIS — Z6841 Body Mass Index (BMI) 40.0 and over, adult: Secondary | ICD-10-CM

## 2024-07-12 MED ORDER — WEGOVY 0.25 MG/0.5ML ~~LOC~~ SOAJ: 0.2500 mg | SUBCUTANEOUS | 0 refills | Status: DC

## 2024-07-12 NOTE — Telephone Encounter (Signed)
 Copied from CRM #[id number]. Topic: Appointments - Transfer of Care >> Jul 09, 2024  1:02 PM [name] wrote: Pt is requesting to transfer FROM: Deborah Riddle Pt is requesting to transfer TO: Meade Gerlach Reason for requested transfer: current PCP is out on maternity leave It is the responsibility of the team the patient would like to transfer to (Dr. Terry Gals Falter) to reach out to the patient if for any reason this transfer is not acceptable.

## 2024-07-12 NOTE — Telephone Encounter (Signed)
 Pharmacy Patient Advocate Encounter   Received notification from Physician's Office that prior authorization for Wegovy 0.25MG /0.5ML auto-injectors is required/requested.   Insurance verification completed.   The patient is insured through [facility].   Per test claim: PA required; PA submitted to above mentioned insurance via Latent Key/confirmation #/EOC [id number] Status is pending

## 2024-07-12 NOTE — Telephone Encounter (Signed)
 Please inform the patient that a prescription for Deborah Riddle has been sent to her pharmacy. Please initiate the prior authorization process if indicated.

## 2024-07-14 ENCOUNTER — Other Ambulatory Visit (HOSPITAL_COMMUNITY): Payer: Self-pay

## 2024-07-14 NOTE — Telephone Encounter (Signed)
 Pharmacy Patient Advocate Encounter  Received notification from [facility] [employer] that Prior Authorization for Wegovy 0.25MG /0.5ML auto-injectors  has been APPROVED from 07/12/2024 to 02/09/2025   PA #/Case ID/Reference #: [id number]

## 2024-07-16 ENCOUNTER — Telehealth: Payer: Self-pay

## 2024-07-16 NOTE — Telephone Encounter (Signed)
 Copied from CRM [id number]. Topic: General - Other >> Jul 16, 2024  9:55 AM Myrick T wrote: Reason for CRM: patient said she was advised by Meade Gerlach that patient could switch Gloria Bacchus is not taking new patients. Please advise

## 2024-07-22 ENCOUNTER — Ambulatory Visit: Admitting: Physician Assistant

## 2024-07-22 NOTE — Telephone Encounter (Signed)
 She is scheduled in Feb to see Leita since Meade will also be getting ready to go out on leave

## 2024-08-06 ENCOUNTER — Telehealth: Payer: Self-pay

## 2024-08-06 NOTE — Telephone Encounter (Signed)
 Copied from CRM #[id number]. Topic: Clinical - Medication Refill >> Aug 06, 2024  9:49 AM Tiffany B wrote: Patient was last seen by Meade Gerlach on 07/09/2024 virtual appoitment. Patient requesting a refill of her  semaglutide -weight management and unsure of what dose medication will be increased too. Patient was informed at last visit once she completes the 0.25 mg dose will be increased.   Patient also stated at last appointment Meade, NP approved for her to transfer her care. Please confirm and schedule.   This is the patient's preferred pharmacy:  [id number] [place], [place] - [address] AT [facility] [address] [place] [address] Phone: [phone] Fax: [phone]  Is this the correct pharmacy for this prescription? Yes   Has the prescription been filled recently? Yes  Is the patient out of the medication? No  Has the patient been seen for an appointment in the last year OR does the patient have an upcoming appointment? Yes  Can we respond through MyChart? Yes  Agent: Please be advised that Rx refills may take up to 3 business days. We ask that you follow-up with your pharmacy.

## 2024-08-06 NOTE — Telephone Encounter (Signed)
 The patient called in to make sure she would be ok to get this medication because she does already have an appointment scheduled next month for a physical. Please assist her as soon as possible.

## 2024-08-09 ENCOUNTER — Other Ambulatory Visit: Payer: Self-pay | Admitting: Family Medicine

## 2024-08-09 DIAGNOSIS — Z6841 Body Mass Index (BMI) 40.0 and over, adult: Secondary | ICD-10-CM

## 2024-08-09 MED ORDER — WEGOVY 0.5 MG/0.5ML ~~LOC~~ SOAJ: 0.5000 mg | SUBCUTANEOUS | 0 refills | Status: AC

## 2024-08-09 NOTE — Telephone Encounter (Signed)
 Rx sent.

## 2024-08-10 NOTE — Telephone Encounter (Signed)
 Patient advised

## 2024-09-21 ENCOUNTER — Ambulatory Visit: Admitting: Physician Assistant
